# Patient Record
Sex: Female | Born: 1950 | Race: White | Hispanic: No | Smoking: Never smoker
Health system: Southern US, Community
[De-identification: ages and names within clinical notes are randomized; demographics above are authoritative.]

## PROBLEM LIST (undated history)

## (undated) DIAGNOSIS — I10 Essential (primary) hypertension: Secondary | ICD-10-CM

## (undated) DIAGNOSIS — Z9189 Other specified personal risk factors, not elsewhere classified: Secondary | ICD-10-CM

## (undated) DIAGNOSIS — R112 Nausea with vomiting, unspecified: Secondary | ICD-10-CM

## (undated) DIAGNOSIS — R87620 Atypical squamous cells of undetermined significance on cytologic smear of vagina (ASC-US): Secondary | ICD-10-CM

## (undated) DIAGNOSIS — Z9889 Other specified postprocedural states: Secondary | ICD-10-CM

## (undated) DIAGNOSIS — N893 Dysplasia of vagina, unspecified: Secondary | ICD-10-CM

## (undated) DIAGNOSIS — K279 Peptic ulcer, site unspecified, unspecified as acute or chronic, without hemorrhage or perforation: Secondary | ICD-10-CM

## (undated) DIAGNOSIS — T782XXA Anaphylactic shock, unspecified, initial encounter: Secondary | ICD-10-CM

## (undated) DIAGNOSIS — N6099 Unspecified benign mammary dysplasia of unspecified breast: Secondary | ICD-10-CM

## (undated) DIAGNOSIS — M4310 Spondylolisthesis, site unspecified: Secondary | ICD-10-CM

## (undated) DIAGNOSIS — IMO0002 Reserved for concepts with insufficient information to code with codable children: Secondary | ICD-10-CM

## (undated) DIAGNOSIS — N159 Renal tubulo-interstitial disease, unspecified: Secondary | ICD-10-CM

## (undated) HISTORY — DX: Reserved for concepts with insufficient information to code with codable children: IMO0002

## (undated) HISTORY — DX: Spondylolisthesis, site unspecified: M43.10

## (undated) HISTORY — DX: Essential (primary) hypertension: I10

## (undated) HISTORY — DX: Dysplasia of vagina, unspecified: N89.3

## (undated) HISTORY — PX: TONSILLECTOMY: SUR1361

## (undated) HISTORY — DX: Unspecified benign mammary dysplasia of unspecified breast: N60.99

## (undated) HISTORY — PX: RECTAL SURGERY: SHX760

## (undated) HISTORY — PX: ECTOPIC PREGNANCY SURGERY: SHX613

## (undated) HISTORY — DX: Atypical squamous cells of undetermined significance on cytologic smear of vagina (ASC-US): R87.620

## (undated) HISTORY — DX: Other specified personal risk factors, not elsewhere classified: Z91.89

## (undated) HISTORY — DX: Anaphylactic shock, unspecified, initial encounter: T78.2XXA

---

## 1991-03-11 HISTORY — PX: VAGINAL HYSTERECTOMY: SUR661

## 1997-11-28 ENCOUNTER — Other Ambulatory Visit: Admission: RE | Admit: 1997-11-28 | Discharge: 1997-11-28 | Payer: Self-pay | Admitting: Obstetrics and Gynecology

## 1998-11-29 ENCOUNTER — Other Ambulatory Visit: Admission: RE | Admit: 1998-11-29 | Discharge: 1998-11-29 | Payer: Self-pay | Admitting: Obstetrics and Gynecology

## 1999-01-09 ENCOUNTER — Other Ambulatory Visit: Admission: RE | Admit: 1999-01-09 | Discharge: 1999-01-09 | Payer: Self-pay | Admitting: Obstetrics and Gynecology

## 1999-02-08 DIAGNOSIS — N893 Dysplasia of vagina, unspecified: Secondary | ICD-10-CM

## 1999-02-08 HISTORY — DX: Dysplasia of vagina, unspecified: N89.3

## 1999-02-18 ENCOUNTER — Other Ambulatory Visit: Admission: RE | Admit: 1999-02-18 | Discharge: 1999-02-18 | Payer: Self-pay | Admitting: Obstetrics and Gynecology

## 1999-02-18 ENCOUNTER — Encounter (INDEPENDENT_AMBULATORY_CARE_PROVIDER_SITE_OTHER): Payer: Self-pay | Admitting: Specialist

## 1999-05-20 ENCOUNTER — Other Ambulatory Visit: Admission: RE | Admit: 1999-05-20 | Discharge: 1999-05-20 | Payer: Self-pay | Admitting: Obstetrics and Gynecology

## 1999-10-23 ENCOUNTER — Other Ambulatory Visit: Admission: RE | Admit: 1999-10-23 | Discharge: 1999-10-23 | Payer: Self-pay | Admitting: Obstetrics and Gynecology

## 1999-12-06 ENCOUNTER — Encounter: Admission: RE | Admit: 1999-12-06 | Discharge: 1999-12-06 | Payer: Self-pay | Admitting: Obstetrics and Gynecology

## 1999-12-06 ENCOUNTER — Encounter: Payer: Self-pay | Admitting: Obstetrics and Gynecology

## 2000-01-29 ENCOUNTER — Other Ambulatory Visit: Admission: RE | Admit: 2000-01-29 | Discharge: 2000-01-29 | Payer: Self-pay | Admitting: Obstetrics and Gynecology

## 2000-07-22 ENCOUNTER — Other Ambulatory Visit: Admission: RE | Admit: 2000-07-22 | Discharge: 2000-07-22 | Payer: Self-pay | Admitting: Obstetrics and Gynecology

## 2000-12-17 ENCOUNTER — Encounter: Admission: RE | Admit: 2000-12-17 | Discharge: 2000-12-17 | Payer: Self-pay | Admitting: Obstetrics and Gynecology

## 2000-12-17 ENCOUNTER — Encounter: Payer: Self-pay | Admitting: Obstetrics and Gynecology

## 2001-02-01 ENCOUNTER — Other Ambulatory Visit: Admission: RE | Admit: 2001-02-01 | Discharge: 2001-02-01 | Payer: Self-pay | Admitting: Obstetrics and Gynecology

## 2001-03-10 HISTORY — PX: KNEE SURGERY: SHX244

## 2001-07-21 ENCOUNTER — Other Ambulatory Visit: Admission: RE | Admit: 2001-07-21 | Discharge: 2001-07-21 | Payer: Self-pay | Admitting: Obstetrics and Gynecology

## 2001-12-31 ENCOUNTER — Encounter: Admission: RE | Admit: 2001-12-31 | Discharge: 2001-12-31 | Payer: Self-pay | Admitting: Obstetrics and Gynecology

## 2001-12-31 ENCOUNTER — Encounter: Payer: Self-pay | Admitting: Obstetrics and Gynecology

## 2002-02-02 ENCOUNTER — Other Ambulatory Visit: Admission: RE | Admit: 2002-02-02 | Discharge: 2002-02-02 | Payer: Self-pay | Admitting: Obstetrics and Gynecology

## 2002-07-27 ENCOUNTER — Other Ambulatory Visit: Admission: RE | Admit: 2002-07-27 | Discharge: 2002-07-27 | Payer: Self-pay | Admitting: Obstetrics and Gynecology

## 2003-01-06 ENCOUNTER — Encounter: Admission: RE | Admit: 2003-01-06 | Discharge: 2003-01-06 | Payer: Self-pay | Admitting: Obstetrics and Gynecology

## 2003-01-11 ENCOUNTER — Encounter: Admission: RE | Admit: 2003-01-11 | Discharge: 2003-01-11 | Payer: Self-pay | Admitting: Obstetrics and Gynecology

## 2003-02-22 ENCOUNTER — Other Ambulatory Visit: Admission: RE | Admit: 2003-02-22 | Discharge: 2003-02-22 | Payer: Self-pay | Admitting: Obstetrics and Gynecology

## 2003-07-28 ENCOUNTER — Other Ambulatory Visit: Admission: RE | Admit: 2003-07-28 | Discharge: 2003-07-28 | Payer: Self-pay | Admitting: Obstetrics and Gynecology

## 2004-02-14 ENCOUNTER — Encounter: Admission: RE | Admit: 2004-02-14 | Discharge: 2004-02-14 | Payer: Self-pay | Admitting: Obstetrics and Gynecology

## 2004-02-23 ENCOUNTER — Encounter: Admission: RE | Admit: 2004-02-23 | Discharge: 2004-02-23 | Payer: Self-pay | Admitting: Obstetrics and Gynecology

## 2004-02-28 ENCOUNTER — Other Ambulatory Visit: Admission: RE | Admit: 2004-02-28 | Discharge: 2004-02-28 | Payer: Self-pay | Admitting: Obstetrics and Gynecology

## 2004-09-04 ENCOUNTER — Other Ambulatory Visit: Admission: RE | Admit: 2004-09-04 | Discharge: 2004-09-04 | Payer: Self-pay | Admitting: Obstetrics and Gynecology

## 2005-02-24 ENCOUNTER — Encounter: Admission: RE | Admit: 2005-02-24 | Discharge: 2005-02-24 | Payer: Self-pay | Admitting: Obstetrics and Gynecology

## 2005-03-04 ENCOUNTER — Other Ambulatory Visit: Admission: RE | Admit: 2005-03-04 | Discharge: 2005-03-04 | Payer: Self-pay | Admitting: Addiction Medicine

## 2005-03-18 ENCOUNTER — Encounter: Admission: RE | Admit: 2005-03-18 | Discharge: 2005-03-18 | Payer: Self-pay | Admitting: Obstetrics and Gynecology

## 2005-09-03 ENCOUNTER — Other Ambulatory Visit: Admission: RE | Admit: 2005-09-03 | Discharge: 2005-09-03 | Payer: Self-pay | Admitting: Obstetrics and Gynecology

## 2006-02-25 ENCOUNTER — Encounter: Admission: RE | Admit: 2006-02-25 | Discharge: 2006-02-25 | Payer: Self-pay | Admitting: Obstetrics and Gynecology

## 2006-03-09 ENCOUNTER — Encounter: Admission: RE | Admit: 2006-03-09 | Discharge: 2006-03-09 | Payer: Self-pay | Admitting: Obstetrics and Gynecology

## 2006-03-11 ENCOUNTER — Other Ambulatory Visit: Admission: RE | Admit: 2006-03-11 | Discharge: 2006-03-11 | Payer: Self-pay | Admitting: Obstetrics and Gynecology

## 2006-09-23 ENCOUNTER — Other Ambulatory Visit: Admission: RE | Admit: 2006-09-23 | Discharge: 2006-09-23 | Payer: Self-pay | Admitting: Obstetrics and Gynecology

## 2007-03-15 ENCOUNTER — Encounter: Admission: RE | Admit: 2007-03-15 | Discharge: 2007-03-15 | Payer: Self-pay | Admitting: Obstetrics and Gynecology

## 2007-03-15 ENCOUNTER — Other Ambulatory Visit: Admission: RE | Admit: 2007-03-15 | Discharge: 2007-03-15 | Payer: Self-pay | Admitting: Obstetrics and Gynecology

## 2007-09-16 ENCOUNTER — Other Ambulatory Visit: Admission: RE | Admit: 2007-09-16 | Discharge: 2007-09-16 | Payer: Self-pay | Admitting: Obstetrics and Gynecology

## 2008-04-03 ENCOUNTER — Encounter: Admission: RE | Admit: 2008-04-03 | Discharge: 2008-04-03 | Payer: Self-pay | Admitting: Obstetrics and Gynecology

## 2008-04-06 ENCOUNTER — Ambulatory Visit: Payer: Self-pay | Admitting: Obstetrics and Gynecology

## 2008-04-06 ENCOUNTER — Encounter: Payer: Self-pay | Admitting: Obstetrics and Gynecology

## 2008-04-06 ENCOUNTER — Other Ambulatory Visit: Admission: RE | Admit: 2008-04-06 | Discharge: 2008-04-06 | Payer: Self-pay | Admitting: Obstetrics and Gynecology

## 2008-09-20 ENCOUNTER — Ambulatory Visit: Payer: Self-pay | Admitting: Obstetrics and Gynecology

## 2008-09-20 ENCOUNTER — Other Ambulatory Visit: Admission: RE | Admit: 2008-09-20 | Discharge: 2008-09-20 | Payer: Self-pay | Admitting: Obstetrics and Gynecology

## 2008-09-20 ENCOUNTER — Encounter: Payer: Self-pay | Admitting: Obstetrics and Gynecology

## 2008-09-22 ENCOUNTER — Ambulatory Visit: Payer: Self-pay | Admitting: Obstetrics and Gynecology

## 2009-03-10 HISTORY — PX: BREAST EXCISIONAL BIOPSY: SUR124

## 2009-04-05 ENCOUNTER — Encounter: Admission: RE | Admit: 2009-04-05 | Discharge: 2009-04-05 | Payer: Self-pay | Admitting: Obstetrics and Gynecology

## 2009-04-09 ENCOUNTER — Other Ambulatory Visit: Admission: RE | Admit: 2009-04-09 | Discharge: 2009-04-09 | Payer: Self-pay | Admitting: Obstetrics and Gynecology

## 2009-04-09 ENCOUNTER — Ambulatory Visit: Payer: Self-pay | Admitting: Obstetrics and Gynecology

## 2009-04-12 ENCOUNTER — Ambulatory Visit: Payer: Self-pay | Admitting: Obstetrics and Gynecology

## 2009-04-18 ENCOUNTER — Ambulatory Visit: Payer: Self-pay | Admitting: Obstetrics and Gynecology

## 2009-10-03 ENCOUNTER — Other Ambulatory Visit: Admission: RE | Admit: 2009-10-03 | Discharge: 2009-10-03 | Payer: Self-pay | Admitting: Obstetrics and Gynecology

## 2009-10-03 ENCOUNTER — Ambulatory Visit: Payer: Self-pay | Admitting: Obstetrics and Gynecology

## 2009-12-27 ENCOUNTER — Ambulatory Visit: Payer: Self-pay | Admitting: Obstetrics and Gynecology

## 2010-01-03 ENCOUNTER — Encounter: Admission: RE | Admit: 2010-01-03 | Discharge: 2010-01-03 | Payer: Self-pay | Admitting: Obstetrics and Gynecology

## 2010-02-25 ENCOUNTER — Ambulatory Visit (HOSPITAL_COMMUNITY)
Admission: RE | Admit: 2010-02-25 | Discharge: 2010-02-25 | Payer: Self-pay | Source: Home / Self Care | Attending: General Surgery | Admitting: General Surgery

## 2010-02-25 HISTORY — PX: BREAST SURGERY: SHX581

## 2010-03-31 ENCOUNTER — Encounter: Payer: Self-pay | Admitting: Obstetrics and Gynecology

## 2010-04-09 ENCOUNTER — Encounter
Admission: RE | Admit: 2010-04-09 | Discharge: 2010-04-09 | Payer: Self-pay | Source: Home / Self Care | Attending: General Surgery | Admitting: General Surgery

## 2010-04-10 ENCOUNTER — Other Ambulatory Visit: Payer: Self-pay | Admitting: General Surgery

## 2010-04-10 DIAGNOSIS — R928 Other abnormal and inconclusive findings on diagnostic imaging of breast: Secondary | ICD-10-CM

## 2010-04-12 ENCOUNTER — Other Ambulatory Visit: Payer: Self-pay | Admitting: General Surgery

## 2010-04-12 ENCOUNTER — Ambulatory Visit
Admission: RE | Admit: 2010-04-12 | Discharge: 2010-04-12 | Disposition: A | Discharge status: Home / Self Care | Payer: BC Managed Care – PPO | Source: Ambulatory Visit | Attending: General Surgery | Admitting: General Surgery

## 2010-04-12 DIAGNOSIS — R928 Other abnormal and inconclusive findings on diagnostic imaging of breast: Secondary | ICD-10-CM

## 2010-04-16 ENCOUNTER — Other Ambulatory Visit: Payer: Self-pay

## 2010-04-26 ENCOUNTER — Encounter (INDEPENDENT_AMBULATORY_CARE_PROVIDER_SITE_OTHER): Payer: BC Managed Care – PPO | Admitting: Obstetrics and Gynecology

## 2010-04-26 ENCOUNTER — Other Ambulatory Visit (HOSPITAL_COMMUNITY)
Admission: RE | Admit: 2010-04-26 | Discharge: 2010-04-26 | Disposition: A | Discharge status: Home / Self Care | Payer: BC Managed Care – PPO | Source: Ambulatory Visit | Attending: Obstetrics and Gynecology | Admitting: Obstetrics and Gynecology

## 2010-04-26 ENCOUNTER — Other Ambulatory Visit: Payer: Self-pay | Admitting: Obstetrics and Gynecology

## 2010-04-26 DIAGNOSIS — Z01419 Encounter for gynecological examination (general) (routine) without abnormal findings: Secondary | ICD-10-CM

## 2010-04-26 DIAGNOSIS — R823 Hemoglobinuria: Secondary | ICD-10-CM

## 2010-04-26 DIAGNOSIS — Z124 Encounter for screening for malignant neoplasm of cervix: Secondary | ICD-10-CM | POA: Insufficient documentation

## 2010-05-02 ENCOUNTER — Other Ambulatory Visit (INDEPENDENT_AMBULATORY_CARE_PROVIDER_SITE_OTHER): Payer: BC Managed Care – PPO | Admitting: Obstetrics and Gynecology

## 2010-05-02 DIAGNOSIS — Z13220 Encounter for screening for lipoid disorders: Secondary | ICD-10-CM

## 2010-05-02 DIAGNOSIS — R635 Abnormal weight gain: Secondary | ICD-10-CM

## 2010-05-06 DIAGNOSIS — Z1211 Encounter for screening for malignant neoplasm of colon: Secondary | ICD-10-CM

## 2010-05-21 LAB — DIFFERENTIAL
Basophils Absolute: 0 10*3/uL (ref 0.0–0.1)
Basophils Relative: 1 % (ref 0–1)
Eosinophils Absolute: 0.2 10*3/uL (ref 0.0–0.7)
Eosinophils Relative: 3 % (ref 0–5)
Lymphocytes Relative: 33 % (ref 12–46)
Lymphs Abs: 2.1 10*3/uL (ref 0.7–4.0)
Monocytes Absolute: 0.5 10*3/uL (ref 0.1–1.0)
Monocytes Relative: 8 % (ref 3–12)
Neutro Abs: 3.5 10*3/uL (ref 1.7–7.7)
Neutrophils Relative %: 56 % (ref 43–77)

## 2010-05-21 LAB — BASIC METABOLIC PANEL
BUN: 19 mg/dL (ref 6–23)
CO2: 30 mEq/L (ref 19–32)
Calcium: 9.7 mg/dL (ref 8.4–10.5)
Chloride: 102 mEq/L (ref 96–112)
Creatinine, Ser: 1.03 mg/dL (ref 0.4–1.2)
GFR calc Af Amer: >60 mL/min (ref >60)
GFR calc non Af Amer: 55 mL/min — ABNORMAL LOW (ref >60)
Glucose, Bld: 88 mg/dL (ref 70–99)
Potassium: 3.6 mEq/L (ref 3.5–5.1)
Sodium: 138 mEq/L (ref 135–145)

## 2010-05-21 LAB — CBC
HCT: 41.5 % (ref 36.0–46.0)
Hemoglobin: 14 g/dL (ref 12.0–15.0)
MCH: 30.4 pg (ref 26.0–34.0)
MCHC: 33.7 g/dL (ref 30.0–36.0)
MCV: 90.2 fL (ref 78.0–100.0)
Platelets: 329 10*3/uL (ref 150–400)
RBC: 4.6 MIL/uL (ref 3.87–5.11)
RDW: 12.8 % (ref 11.5–15.5)
WBC: 6.4 10*3/uL (ref 4.0–10.5)

## 2010-05-21 LAB — SURGICAL PCR SCREEN
MRSA, PCR: NEGATIVE
Staphylococcus aureus: NEGATIVE

## 2011-03-14 ENCOUNTER — Other Ambulatory Visit: Payer: Self-pay | Admitting: Obstetrics and Gynecology

## 2011-03-14 DIAGNOSIS — Z1231 Encounter for screening mammogram for malignant neoplasm of breast: Secondary | ICD-10-CM

## 2011-04-14 ENCOUNTER — Ambulatory Visit
Admission: RE | Admit: 2011-04-14 | Discharge: 2011-04-14 | Disposition: A | Discharge status: Home / Self Care | Payer: BC Managed Care – PPO | Source: Ambulatory Visit | Attending: Obstetrics and Gynecology | Admitting: Obstetrics and Gynecology

## 2011-04-14 DIAGNOSIS — Z1231 Encounter for screening mammogram for malignant neoplasm of breast: Secondary | ICD-10-CM

## 2011-04-18 ENCOUNTER — Encounter: Payer: Self-pay | Admitting: *Deleted

## 2011-04-18 ENCOUNTER — Other Ambulatory Visit: Payer: Self-pay | Admitting: *Deleted

## 2011-04-18 DIAGNOSIS — N63 Unspecified lump in unspecified breast: Secondary | ICD-10-CM

## 2011-04-23 ENCOUNTER — Other Ambulatory Visit: Payer: Self-pay | Admitting: *Deleted

## 2011-04-23 DIAGNOSIS — N63 Unspecified lump in unspecified breast: Secondary | ICD-10-CM

## 2011-04-28 ENCOUNTER — Other Ambulatory Visit: Payer: Self-pay | Admitting: Obstetrics and Gynecology

## 2011-04-28 NOTE — Telephone Encounter (Signed)
HAS CE SCHEDULED TOMORROW.

## 2011-04-29 ENCOUNTER — Other Ambulatory Visit (HOSPITAL_COMMUNITY)
Admission: RE | Admit: 2011-04-29 | Discharge: 2011-04-29 | Disposition: A | Discharge status: Home / Self Care | Payer: BC Managed Care – PPO | Source: Ambulatory Visit | Attending: Obstetrics and Gynecology | Admitting: Obstetrics and Gynecology

## 2011-04-29 ENCOUNTER — Ambulatory Visit (INDEPENDENT_AMBULATORY_CARE_PROVIDER_SITE_OTHER): Payer: BC Managed Care – PPO | Admitting: Obstetrics and Gynecology

## 2011-04-29 ENCOUNTER — Encounter: Payer: Self-pay | Admitting: Obstetrics and Gynecology

## 2011-04-29 ENCOUNTER — Telehealth: Payer: Self-pay | Admitting: *Deleted

## 2011-04-29 VITALS — BP 124/78 | Ht 70.0 in | Wt 186.0 lb

## 2011-04-29 DIAGNOSIS — Z01419 Encounter for gynecological examination (general) (routine) without abnormal findings: Secondary | ICD-10-CM | POA: Insufficient documentation

## 2011-04-29 DIAGNOSIS — N898 Other specified noninflammatory disorders of vagina: Secondary | ICD-10-CM

## 2011-04-29 DIAGNOSIS — IMO0002 Reserved for concepts with insufficient information to code with codable children: Secondary | ICD-10-CM | POA: Insufficient documentation

## 2011-04-29 DIAGNOSIS — I10 Essential (primary) hypertension: Secondary | ICD-10-CM | POA: Insufficient documentation

## 2011-04-29 DIAGNOSIS — R3129 Other microscopic hematuria: Secondary | ICD-10-CM

## 2011-04-29 DIAGNOSIS — T782XXA Anaphylactic shock, unspecified, initial encounter: Secondary | ICD-10-CM | POA: Insufficient documentation

## 2011-04-29 DIAGNOSIS — Z1159 Encounter for screening for other viral diseases: Secondary | ICD-10-CM | POA: Insufficient documentation

## 2011-04-29 DIAGNOSIS — N893 Dysplasia of vagina, unspecified: Secondary | ICD-10-CM

## 2011-04-29 LAB — URINALYSIS W MICROSCOPIC + REFLEX CULTURE
Casts: NONE SEEN
Glucose, UA: NEGATIVE mg/dL
Ketones, ur: NEGATIVE mg/dL
Leukocytes, UA: NEGATIVE
Protein, ur: NEGATIVE mg/dL
WBC, UA: NONE SEEN WBC/hpf (ref <3)

## 2011-04-29 MED ORDER — ESTRADIOL 1 MG PO TABS: 1.0000 mg | ORAL_TABLET | Freq: Every day | ORAL | Status: DC

## 2011-04-29 NOTE — Telephone Encounter (Signed)
Called pharmacy to get changed.

## 2011-04-29 NOTE — Progress Notes (Signed)
The patient came to see me today for her annual GYN exam. She continues to do well on her estradiol. She has been recalled for possible mass in her right breast this Friday. She is having no vaginal bleeding. She is having no pelvic pain. She is now had 3 normal Pap smears. She has had normal bone densities. She does her lab through her PCP. She had an anaphylactic reaction with etiology unknown this year. She does her lab through PCP. Last year she had a workup from the urologist for microscopic hematuria which was normal.  HEENT: Within normal limits. Kennon Portela present Neck: No masses. Supraclavicular lymph nodes: Not enlarged. Breasts: Examined in both sitting and lying position. Symmetrical without skin changes or masses. Abdomen: Soft no masses guarding or rebound. No hernias. Pelvic: External within normal limits. BUS within normal limits. Vaginal examination shows good estrogen effect, no cystocele enterocele or rectocele. Cervix and uterus absent. Adnexa within normal limits. Rectovaginal confirmatory. Extremities within normal limits.  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 Microscopic hematuria. Menopausal symptoms.  Plan: Continue estradiol 1 mg daily. Followup mammogram this week.

## 2011-04-29 NOTE — Telephone Encounter (Signed)
[facility] faxed to say that the Estradiol 1mg  is not available, can we change to 0.5 BID?

## 2011-04-29 NOTE — Telephone Encounter (Signed)
Yes. She can either do it twice a day or take both pills together.

## 2011-04-30 LAB — URINE CULTURE
Colony Count: NO GROWTH
Organism ID, Bacteria: NO GROWTH

## 2011-05-02 ENCOUNTER — Ambulatory Visit
Admission: RE | Admit: 2011-05-02 | Discharge: 2011-05-02 | Disposition: A | Discharge status: Home / Self Care | Payer: BC Managed Care – PPO | Source: Ambulatory Visit | Attending: Obstetrics and Gynecology | Admitting: Obstetrics and Gynecology

## 2011-05-02 DIAGNOSIS — N63 Unspecified lump in unspecified breast: Secondary | ICD-10-CM

## 2011-10-16 ENCOUNTER — Other Ambulatory Visit (HOSPITAL_COMMUNITY)
Admission: RE | Admit: 2011-10-16 | Discharge: 2011-10-16 | Disposition: A | Discharge status: Home / Self Care | Payer: BC Managed Care – PPO | Source: Ambulatory Visit | Attending: Obstetrics and Gynecology | Admitting: Obstetrics and Gynecology

## 2011-10-16 ENCOUNTER — Ambulatory Visit (INDEPENDENT_AMBULATORY_CARE_PROVIDER_SITE_OTHER): Payer: BC Managed Care – PPO | Admitting: Obstetrics and Gynecology

## 2011-10-16 DIAGNOSIS — Z1151 Encounter for screening for human papillomavirus (HPV): Secondary | ICD-10-CM | POA: Insufficient documentation

## 2011-10-16 DIAGNOSIS — N893 Dysplasia of vagina, unspecified: Secondary | ICD-10-CM

## 2011-10-16 DIAGNOSIS — Z01419 Encounter for gynecological examination (general) (routine) without abnormal findings: Secondary | ICD-10-CM | POA: Insufficient documentation

## 2011-10-16 DIAGNOSIS — N898 Other specified noninflammatory disorders of vagina: Secondary | ICD-10-CM

## 2011-10-16 NOTE — Progress Notes (Signed)
Patient came to see me today for follow Pap smear. 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 She continued to be observed. Recently she has 3 normal Pap smears in a row. Then in February of this year she had a Pap smear showing ascus with no high-risk HPV detected. This was prior to the new guidelines we asked her return in 6 months which is now.  Exam: Kennon Portela present. External: Within normal limits. BUS: Within normal limits. Vaginal exam: Within normal limits. Bimanual exam fails to reveal Claybon Jabs is status post vaginal hysterectomy.  Assessment: Vaginal dysplasia  Plan: Cytology and  co-testing done. Discussed new guidelines. I believe if all okay she can return to routine screening.

## 2011-10-16 NOTE — Patient Instructions (Signed)
I will call you with Pap smear results.

## 2012-04-08 ENCOUNTER — Other Ambulatory Visit: Payer: Self-pay | Admitting: Gynecology

## 2012-04-08 DIAGNOSIS — Z1231 Encounter for screening mammogram for malignant neoplasm of breast: Secondary | ICD-10-CM

## 2012-05-04 ENCOUNTER — Other Ambulatory Visit: Payer: Self-pay | Admitting: Obstetrics and Gynecology

## 2012-05-06 ENCOUNTER — Ambulatory Visit
Admission: RE | Admit: 2012-05-06 | Discharge: 2012-05-06 | Disposition: A | Discharge status: Home / Self Care | Payer: BC Managed Care – PPO | Source: Ambulatory Visit | Attending: Gynecology | Admitting: Gynecology

## 2012-05-12 ENCOUNTER — Other Ambulatory Visit: Payer: Self-pay | Admitting: Advanced Practice Midwife

## 2012-05-14 ENCOUNTER — Other Ambulatory Visit (HOSPITAL_COMMUNITY)
Admission: RE | Admit: 2012-05-14 | Discharge: 2012-05-14 | Disposition: A | Discharge status: Home / Self Care | Payer: BC Managed Care – PPO | Source: Ambulatory Visit | Attending: Gynecology | Admitting: Gynecology

## 2012-05-14 ENCOUNTER — Encounter: Payer: Self-pay | Admitting: Gynecology

## 2012-05-14 ENCOUNTER — Ambulatory Visit (INDEPENDENT_AMBULATORY_CARE_PROVIDER_SITE_OTHER): Payer: BC Managed Care – PPO | Admitting: Gynecology

## 2012-05-14 VITALS — BP 130/80 | Ht 70.0 in | Wt 168.0 lb

## 2012-05-14 DIAGNOSIS — Z01419 Encounter for gynecological examination (general) (routine) without abnormal findings: Secondary | ICD-10-CM | POA: Insufficient documentation

## 2012-05-14 MED ORDER — ESTRADIOL 1 MG PO TABS: 1.0000 mg | ORAL_TABLET | Freq: Every day | ORAL | Status: DC

## 2012-05-14 MED ORDER — ZOLPIDEM TARTRATE 5 MG PO TABS: 5.0000 mg | ORAL_TABLET | Freq: Every evening | ORAL | Status: DC | PRN

## 2012-05-14 MED ORDER — EPINEPHRINE 0.3 MG/0.3ML IJ DEVI
0.3000 mg | INTRAMUSCULAR | Status: DC | PRN
Start: 2012-05-14 — End: 2013-12-11

## 2012-05-14 NOTE — Progress Notes (Signed)
Pamela Whitehead 07/13/1950 [id number]        62 y.o.  G1P0010 for annual exam.  Several issues noted below.  Past medical history,surgical history, medications, allergies, family history and social history were all reviewed and documented in the EPIC chart. ROS:  Was performed and pertinent positives and negatives are included in the history.  Exam: Kim assistant Filed Vitals:   05/14/12 0926  BP: 130/80  Height: 5\' 10"  (1.778 m)  Weight: 168 lb (76.204 kg)   General appearance  Normal Skin grossly normal Head/Neck normal with no cervical or supraclavicular adenopathy thyroid normal Lungs  clear Cardiac RR, without RMG Abdominal  soft, nontender, without masses, organomegaly or hernia Breasts  examined lying and sitting. Left without masses, retractions, discharge or axillary adenopathy.  Right with well circumscribed mass 1.5 cm lower outer quadrant 2-3 finger breaths from areola. Freely mobile no overlying skin changes. No nipple discharge other masses or axillary adenopathy Pelvic  Ext/BUS/vagina  normal with mild atrophic changes  Adnexa  Without masses or tenderness    Anus and perineum  normal   Rectovaginal  normal sphincter tone without palpated masses or tenderness.    Assessment/Plan:  62 y.o. G76P0010 female for annual exam.   1. Right breast mass. Known to be a cyst by mammography/ultrasound last year. Is due for her studies now and we'll schedule diagnostic mammogram with ultrasound followup. Offered to aspirate it today and the patient declined. Assuming stable and consistent with benign etiology then we'll continue to follow at her choice. 2. ERT. Patient is on estradiol 1 mg doing well. I reviewed the WHI study and risks of ERT to include stroke heart attack DVT possible breast cancer Association.  ACOG and NAMS statements for lowest dose for shortness. Upon review. Patient's great happy being on HRT and wants to continue it I refilled her x1 year. 3. History of ascus  negative high risk HPV. Status post hysterectomy for endometriosis. LGSIL vaginal cuff 2001 per Dr. Verl Dicker note. Followup Pap smears normal at the last year was 2 ascus Pap smears negative HPV. Pap of cuff done today.  If normal continue observation if otherwise will triaged based on results. 4. Colonoscopy. Patient's never had. I emphasized the need to schedule as has Dr. Abigail Miyamoto. Patient understands the recommendations and agrees to schedule. 5. DEXA normal 5 years ago. Recommend repeat now and she will schedule. Increase calcium vitamin D reviewed. 6. Occasional insomnia. Uses Ambien 5 mg. #30 with one refill provided. 7. Health maintenance. No blood work done as it is all done through Dr. Nils Flack office. Refilled her EpiPen at her request. Followup one year, sooner as needed.    Dara Lords MD, 10:08 AM 05/14/2012

## 2012-05-14 NOTE — Patient Instructions (Signed)
Followup for bone density as scheduled. Followup in one year for annual exam 

## 2012-05-14 NOTE — Addendum Note (Signed)
Addended by: Dayna Barker on: 05/14/2012 10:16 AM   Modules accepted: Orders

## 2012-05-17 ENCOUNTER — Telehealth: Payer: Self-pay | Admitting: *Deleted

## 2012-05-17 LAB — URINALYSIS W MICROSCOPIC + REFLEX CULTURE
Casts: NONE SEEN
Crystals: NONE SEEN
Leukocytes, UA: NEGATIVE
Nitrite: NEGATIVE
Specific Gravity, Urine: 1.012 (ref 1.005–1.030)
Squamous Epithelial / LPF: NONE SEEN
pH: 6 (ref 5.0–8.0)

## 2012-05-17 NOTE — Telephone Encounter (Signed)
Message copied by Aura Camps on Mon May 17, 2012  9:03 AM ------      Message from: Dara Lords      Created: Fri May 14, 2012 10:15 AM       Help patient arrange diagnostic mammography and ultrasound at breast center reference annual followup with history of right breast cyst palpable on exam ------

## 2012-05-17 NOTE — Telephone Encounter (Signed)
Orders placed for the below note.

## 2012-05-21 NOTE — Telephone Encounter (Signed)
appt 05/27/12 2 8:00 am

## 2012-05-27 ENCOUNTER — Ambulatory Visit
Admission: RE | Admit: 2012-05-27 | Discharge: 2012-05-27 | Disposition: A | Discharge status: Home / Self Care | Payer: BC Managed Care – PPO | Source: Ambulatory Visit | Attending: Gynecology | Admitting: Gynecology

## 2012-08-12 ENCOUNTER — Ambulatory Visit (INDEPENDENT_AMBULATORY_CARE_PROVIDER_SITE_OTHER): Payer: BC Managed Care – PPO

## 2012-08-12 DIAGNOSIS — Z13820 Encounter for screening for osteoporosis: Secondary | ICD-10-CM

## 2012-08-12 DIAGNOSIS — Z01419 Encounter for gynecological examination (general) (routine) without abnormal findings: Secondary | ICD-10-CM

## 2012-08-12 DIAGNOSIS — Z780 Asymptomatic menopausal state: Secondary | ICD-10-CM

## 2012-08-16 ENCOUNTER — Encounter (HOSPITAL_COMMUNITY): Payer: Self-pay | Admitting: Family Medicine

## 2012-08-16 ENCOUNTER — Emergency Department (HOSPITAL_COMMUNITY)
Admission: EM | Admit: 2012-08-16 | Discharge: 2012-08-16 | Disposition: A | Discharge status: Home / Self Care | Payer: BC Managed Care – PPO | Attending: Emergency Medicine | Admitting: Emergency Medicine

## 2012-08-16 ENCOUNTER — Emergency Department (HOSPITAL_COMMUNITY): Payer: BC Managed Care – PPO

## 2012-08-16 DIAGNOSIS — R55 Syncope and collapse: Secondary | ICD-10-CM

## 2012-08-16 DIAGNOSIS — Z79899 Other long term (current) drug therapy: Secondary | ICD-10-CM | POA: Insufficient documentation

## 2012-08-16 DIAGNOSIS — Z880 Allergy status to penicillin: Secondary | ICD-10-CM | POA: Insufficient documentation

## 2012-08-16 DIAGNOSIS — Z87411 Personal history of vaginal dysplasia: Secondary | ICD-10-CM | POA: Insufficient documentation

## 2012-08-16 DIAGNOSIS — Z872 Personal history of diseases of the skin and subcutaneous tissue: Secondary | ICD-10-CM | POA: Insufficient documentation

## 2012-08-16 DIAGNOSIS — Z8742 Personal history of other diseases of the female genital tract: Secondary | ICD-10-CM | POA: Insufficient documentation

## 2012-08-16 DIAGNOSIS — N390 Urinary tract infection, site not specified: Secondary | ICD-10-CM

## 2012-08-16 DIAGNOSIS — I10 Essential (primary) hypertension: Secondary | ICD-10-CM | POA: Insufficient documentation

## 2012-08-16 DIAGNOSIS — Z8739 Personal history of other diseases of the musculoskeletal system and connective tissue: Secondary | ICD-10-CM | POA: Insufficient documentation

## 2012-08-16 DIAGNOSIS — R42 Dizziness and giddiness: Secondary | ICD-10-CM | POA: Insufficient documentation

## 2012-08-16 LAB — CBC WITH DIFFERENTIAL/PLATELET
Basophils Absolute: 0 10*3/uL (ref 0.0–0.1)
Basophils Relative: 0 % (ref 0–1)
Eosinophils Relative: 0 % (ref 0–5)
HCT: 42.6 % (ref 36.0–46.0)
Hemoglobin: 14.4 g/dL (ref 12.0–15.0)
Lymphocytes Relative: 9 % — ABNORMAL LOW (ref 12–46)
MCHC: 33.8 g/dL (ref 30.0–36.0)
MCV: 89.7 fL (ref 78.0–100.0)
Monocytes Absolute: 0.7 10*3/uL (ref 0.1–1.0)
Monocytes Relative: 5 % (ref 3–12)
RDW: 12.7 % (ref 11.5–15.5)

## 2012-08-16 LAB — URINE MICROSCOPIC-ADD ON

## 2012-08-16 LAB — COMPREHENSIVE METABOLIC PANEL
AST: 35 U/L (ref 0–37)
BUN: 24 mg/dL — ABNORMAL HIGH (ref 6–23)
CO2: 27 mEq/L (ref 19–32)
Calcium: 9.6 mg/dL (ref 8.4–10.5)
Creatinine, Ser: 1.08 mg/dL (ref 0.50–1.10)
GFR calc non Af Amer: 54 mL/min — ABNORMAL LOW (ref >90)
Total Bilirubin: 0.2 mg/dL — ABNORMAL LOW (ref 0.3–1.2)

## 2012-08-16 LAB — URINALYSIS, ROUTINE W REFLEX MICROSCOPIC
Glucose, UA: NEGATIVE mg/dL
Ketones, ur: NEGATIVE mg/dL
Leukocytes, UA: NEGATIVE
Nitrite: NEGATIVE
Protein, ur: NEGATIVE mg/dL
Urobilinogen, UA: 0.2 mg/dL (ref 0.0–1.0)

## 2012-08-16 LAB — TROPONIN I: Troponin I: <0.3 ng/mL (ref <0.30)

## 2012-08-16 MED ORDER — CEPHALEXIN 250 MG PO CAPS
500.0000 mg | ORAL_CAPSULE | Freq: Two times a day (BID) | ORAL | Status: DC
  Administered 2012-08-16: 500 mg via ORAL
  Filled 2012-08-16: qty 2

## 2012-08-16 MED ORDER — SODIUM CHLORIDE 0.9 % IV BOLUS (SEPSIS)
500.0000 mL | Freq: Once | INTRAVENOUS | Status: AC
  Administered 2012-08-16: 500 mL via INTRAVENOUS

## 2012-08-16 MED ORDER — CEPHALEXIN 500 MG PO CAPS: 500.0000 mg | ORAL_CAPSULE | Freq: Two times a day (BID) | ORAL | Status: DC

## 2012-08-16 MED ORDER — ONDANSETRON 8 MG PO TBDP: 8.0000 mg | ORAL_TABLET | Freq: Three times a day (TID) | ORAL | Status: DC | PRN

## 2012-08-16 NOTE — ED Provider Notes (Signed)
History     CSN: [id number]  Arrival date & time 08/16/12  4098   First MD Initiated Contact with Patient 08/16/12 0827      Chief Complaint  Patient presents with  . Near Syncope    (Consider location/radiation/quality/duration/timing/severity/associated sxs/prior treatment) HPI Comments: [name] who became light headed today while drying her hair.  She took her usual am bp medicine and had taken a vicodin about 0200.  She became very diaphoretic, then light headed,  had a bowel movement, and had to lay on the floor because her vision was going dark and she felt she was losing consciousness.  She denies chest pain, dyspnea, headache, fever, or prior similar events.  She normally take her bp med first thing in a.m but does not normally take vicodin.    The history is provided by the patient.    Past Medical History  Diagnosis Date  . Endometriosis   . 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 Spondylisthesis   . 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 Hypertension   . Ulcer   . Anaphylactic reaction     Unknown cause    Past Surgical History  Procedure Laterality Date  . Knee surgery  2003  . Breast surgery  02/25/2010    left breast biopsy ..for left breast mass. . . for PASH  . Vaginal hysterectomy  1993    vaginal hysterectomy  . Ectopic pregnancy surgery    . Rectal surgery      Family History  Problem Relation Age of Onset  . Hypertension Mother   . Heart disease Father   . Breast cancer Maternal Aunt     Age 4's  . Heart disease Brother     History  Substance Use Topics  . Smoking status: Never Smoker   . Smokeless tobacco: Not on file  . Alcohol Use: Yes     Comment: rare    OB History   Grav Para Term Preterm Abortions TAB SAB Ect Mult Living   1 0   1  0 1  0      Review of Systems  All other systems reviewed and are negative.    Allergies  Celebrex and  Penicillins  Home Medications   Current Outpatient Rx  Name  Route  Sig  Dispense  Refill  . butalbital-acetaminophen-caffeine (FIORICET, ESGIC) 50-325-40 MG per tablet   Oral   Take 1 tablet by mouth 2 (two) times daily as needed for headache.          . cetirizine (ZYRTEC) 10 MG tablet   Oral   Take 10 mg by mouth daily.         Marland Kitchen estradiol (ESTRACE) 1 MG tablet   Oral   Take 1 tablet (1 mg total) by mouth daily.   30 tablet   11   . hydrochlorothiazide (MICROZIDE) 12.5 MG capsule   Oral   Take 12.5 mg by mouth daily.         . RABEprazole (ACIPHEX) 20 MG tablet   Oral   Take 20 mg by mouth daily.         . traMADol-acetaminophen (ULTRACET) 37.5-325 MG per tablet   Oral   Take 1 tablet by mouth every 6 (six) hours as needed for pain.         . valsartan (DIOVAN) 40 MG tablet   Oral   Take 40 mg by mouth  daily.         . zolpidem (AMBIEN) 5 MG tablet   Oral   Take 1 tablet (5 mg total) by mouth at bedtime as needed.   30 tablet   1   . EPINEPHrine (EPI-PEN) 0.3 mg/0.3 mL DEVI   Intramuscular   Inject 0.3 mLs (0.3 mg total) into the muscle as needed.   1 Device   1     BP 139/56  Pulse 78  Temp(Src) 97.9 F (36.6 C) (Oral)  Resp 14  SpO2 100%  Physical Exam  Nursing note and vitals reviewed. Constitutional: She appears well-developed and well-nourished.  HENT:  Head: Normocephalic and atraumatic.  Right Ear: External ear normal.  Left Ear: External ear normal.  Nose: Nose normal.  Mouth/Throat: Oropharynx is clear and moist.  Eyes: Conjunctivae and EOM are normal. Pupils are equal, round, and reactive to light.  Neck: Normal range of motion. Neck supple.  Cardiovascular: Normal rate, regular rhythm, normal heart sounds and intact distal pulses.   Pulmonary/Chest: Effort normal and breath sounds normal.  Abdominal: Soft. Bowel sounds are normal. There is no tenderness.  Musculoskeletal: Normal range of motion. She exhibits no edema and  no tenderness.  Neurological: She is alert. She has normal strength. No cranial nerve deficit or sensory deficit. She displays a negative Romberg sign. GCS eye subscore is 4. GCS verbal subscore is 5. GCS motor subscore is 6.  Reflex Scores:      Tricep reflexes are 2+ on the right side and 2+ on the left side.      Bicep reflexes are 2+ on the right side and 2+ on the left side.      Brachioradialis reflexes are 2+ on the right side and 2+ on the left side.      Patellar reflexes are 2+ on the right side and 2+ on the left side.      Achilles reflexes are 2+ on the right side and 2+ on the left side.   ED Course  Procedures (including critical care time)  Labs Reviewed  CBC WITH DIFFERENTIAL - Abnormal; Notable for the following:    WBC 13.7 (*)    Neutrophils Relative % 85 (*)    Neutro Abs 11.7 (*)    Lymphocytes Relative 9 (*)    All other components within normal limits  COMPREHENSIVE METABOLIC PANEL - Abnormal; Notable for the following:    Potassium 3.2 (*)    Glucose, Bld 101 (*)    BUN 24 (*)    Alkaline Phosphatase 123 (*)    Total Bilirubin 0.2 (*)    GFR calc non Af Amer 54 (*)    GFR calc Af Amer 62 (*)    All other components within normal limits  URINALYSIS, ROUTINE W REFLEX MICROSCOPIC - Abnormal; Notable for the following:    APPearance HAZY (*)    Hgb urine dipstick LARGE (*)    All other components within normal limits  URINE MICROSCOPIC-ADD ON - Abnormal; Notable for the following:    Squamous Epithelial / LPF FEW (*)    Bacteria, UA MANY (*)    Casts HYALINE CASTS (*)    All other components within normal limits  TROPONIN I  OCCULT BLOOD X 1 CARD TO LAB, STOOL   Dg Chest 2 View  08/16/2012   *RADIOLOGY REPORT*  Clinical Data: Dizzy with syncope.  CHEST - 2 VIEW  Comparison: 02/20/2010.  Findings: Normal heart size with clear lung fields.  Azygos  lobe. Minimal scoliosis convex left.  IMPRESSION: No active cardiopulmonary disease.   Original Report  Authenticated By: Davonna Belling, M.D.     No diagnosis found.   Date: 08/16/2012  Rate: 62  Rhythm: normal sinus rhythm  QRS Axis: normal  Intervals: normal  ST/T Wave abnormalities: nonspecific ST changes  Conduction Disutrbances:none  Narrative Interpretation:   Old EKG Reviewed: no change from first prior of 02/18/10  Results for orders placed during the hospital encounter of 08/16/12  CBC WITH DIFFERENTIAL      Result Value Range   WBC 13.7 (*) 4.0 - 10.5 K/uL   RBC 4.75  3.87 - 5.11 MIL/uL   Hemoglobin 14.4  12.0 - 15.0 g/dL   HCT 16.1  09.6 - 04.5 %   MCV 89.7  78.0 - 100.0 fL   MCH 30.3  26.0 - 34.0 pg   MCHC 33.8  30.0 - 36.0 g/dL   RDW 40.9  81.1 - 91.4 %   Platelets 311  150 - 400 K/uL   Neutrophils Relative % 85 (*) 43 - 77 %   Neutro Abs 11.7 (*) 1.7 - 7.7 K/uL   Lymphocytes Relative 9 (*) 12 - 46 %   Lymphs Abs 1.2  0.7 - 4.0 K/uL   Monocytes Relative 5  3 - 12 %   Monocytes Absolute 0.7  0.1 - 1.0 K/uL   Eosinophils Relative 0  0 - 5 %   Eosinophils Absolute 0.1  0.0 - 0.7 K/uL   Basophils Relative 0  0 - 1 %   Basophils Absolute 0.0  0.0 - 0.1 K/uL  COMPREHENSIVE METABOLIC PANEL      Result Value Range   Sodium 138  135 - 145 mEq/L   Potassium 3.2 (*) 3.5 - 5.1 mEq/L   Chloride 100  96 - 112 mEq/L   CO2 27  19 - 32 mEq/L   Glucose, Bld 101 (*) 70 - 99 mg/dL   BUN 24 (*) 6 - 23 mg/dL   Creatinine, Ser 7.82  0.50 - 1.10 mg/dL   Calcium 9.6  8.4 - 95.6 mg/dL   Total Protein 6.8  6.0 - 8.3 g/dL   Albumin 3.7  3.5 - 5.2 g/dL   AST 35  0 - 37 U/L   ALT 24  0 - 35 U/L   Alkaline Phosphatase 123 (*) 39 - 117 U/L   Total Bilirubin 0.2 (*) 0.3 - 1.2 mg/dL   GFR calc non Af Amer 54 (*) >90 mL/min   GFR calc Af Amer 62 (*) >90 mL/min  TROPONIN I      Result Value Range   Troponin I <0.30  <0.30 ng/mL  URINALYSIS, ROUTINE W REFLEX MICROSCOPIC      Result Value Range   Color, Urine YELLOW  YELLOW   APPearance HAZY (*) CLEAR   Specific Gravity, Urine 1.015   1.005 - 1.030   pH 6.0  5.0 - 8.0   Glucose, UA NEGATIVE  NEGATIVE mg/dL   Hgb urine dipstick LARGE (*) NEGATIVE   Bilirubin Urine NEGATIVE  NEGATIVE   Ketones, ur NEGATIVE  NEGATIVE mg/dL   Protein, ur NEGATIVE  NEGATIVE mg/dL   Urobilinogen, UA 0.2  0.0 - 1.0 mg/dL   Nitrite NEGATIVE  NEGATIVE   Leukocytes, UA NEGATIVE  NEGATIVE  URINE MICROSCOPIC-ADD ON      Result Value Range   Squamous Epithelial / LPF FEW (*) RARE   WBC, UA 0-2  <3 WBC/hpf   RBC /  HPF 7-10  <3 RBC/hpf   Bacteria, UA MANY (*) RARE   Casts HYALINE CASTS (*) NEGATIVE  OCCULT BLOOD, POC DEVICE      Result Value Range   Fecal Occult Bld POSITIVE (*) NEGATIVE       Near syncope- suspect multifactorial as patient on antihypertensive, took a vicodin and may have uti.  EKG stable, no chest pain.  Patient given iv fluids and afebrile here.  Plan antibiotics, encourage po intake and close follow up.  Patient with heme positive stool but normal hemoglobin and scheduled for screening colonoscopy July.          Hilario Quarry, MD 08/16/12 [phone]

## 2012-08-16 NOTE — ED Notes (Signed)
Hemoccult results are trace positive, recorded as positive.

## 2012-08-16 NOTE — ED Notes (Signed)
Per pt woke up this am and felt nausea, pale and weak. sts after breakfast went to have a BM and felt like she was going to pass out. CBG 131. Stroke scale negative. HR 90. 12 lead NSR. sts cool clammy, diaphoretic upon EMS arrival. IV 20 LFA. sts last night lower right back pain.

## 2012-08-16 NOTE — ED Notes (Signed)
Patient transported to X-ray 

## 2013-01-24 ENCOUNTER — Telehealth: Payer: Self-pay | Admitting: *Deleted

## 2013-01-24 NOTE — Telephone Encounter (Signed)
ERROR

## 2013-04-19 ENCOUNTER — Other Ambulatory Visit: Payer: Self-pay

## 2013-04-19 DIAGNOSIS — Z1231 Encounter for screening mammogram for malignant neoplasm of breast: Secondary | ICD-10-CM

## 2013-05-10 ENCOUNTER — Other Ambulatory Visit: Payer: Self-pay | Admitting: Gynecology

## 2013-05-17 ENCOUNTER — Encounter: Payer: BC Managed Care – PPO | Admitting: Gynecology

## 2013-05-18 ENCOUNTER — Encounter: Payer: BC Managed Care – PPO | Admitting: Gynecology

## 2013-05-20 ENCOUNTER — Ambulatory Visit (INDEPENDENT_AMBULATORY_CARE_PROVIDER_SITE_OTHER): Payer: BC Managed Care – PPO | Admitting: Gynecology

## 2013-05-20 ENCOUNTER — Encounter: Payer: Self-pay | Admitting: Gynecology

## 2013-05-20 ENCOUNTER — Other Ambulatory Visit (HOSPITAL_COMMUNITY)
Admission: RE | Admit: 2013-05-20 | Discharge: 2013-05-20 | Disposition: A | Discharge status: Home / Self Care | Payer: BC Managed Care – PPO | Source: Ambulatory Visit | Attending: Gynecology | Admitting: Gynecology

## 2013-05-20 VITALS — BP 120/76 | Ht 70.0 in | Wt 176.0 lb

## 2013-05-20 DIAGNOSIS — Z01419 Encounter for gynecological examination (general) (routine) without abnormal findings: Secondary | ICD-10-CM

## 2013-05-20 DIAGNOSIS — Z79890 Hormone replacement therapy (postmenopausal): Secondary | ICD-10-CM

## 2013-05-20 LAB — COMPREHENSIVE METABOLIC PANEL
ALK PHOS: 100 U/L (ref 39–117)
ALT: 28 U/L (ref 0–35)
AST: 35 U/L (ref 0–37)
Albumin: 4.2 g/dL (ref 3.5–5.2)
BILIRUBIN TOTAL: 0.4 mg/dL (ref 0.2–1.2)
BUN: 22 mg/dL (ref 6–23)
CO2: 28 meq/L (ref 19–32)
CREATININE: 0.95 mg/dL (ref 0.50–1.10)
Calcium: 9.3 mg/dL (ref 8.4–10.5)
Chloride: 105 mEq/L (ref 96–112)
Glucose, Bld: 90 mg/dL (ref 70–99)
Potassium: 4.4 mEq/L (ref 3.5–5.3)
Sodium: 141 mEq/L (ref 135–145)
TOTAL PROTEIN: 6.4 g/dL (ref 6.0–8.3)

## 2013-05-20 LAB — CBC WITH DIFFERENTIAL/PLATELET
BASOS ABS: 0.1 10*3/uL (ref 0.0–0.1)
Basophils Relative: 1 % (ref 0–1)
EOS ABS: 0.1 10*3/uL (ref 0.0–0.7)
EOS PCT: 2 % (ref 0–5)
HCT: 37.7 % (ref 36.0–46.0)
Hemoglobin: 13.1 g/dL (ref 12.0–15.0)
Lymphocytes Relative: 26 % (ref 12–46)
Lymphs Abs: 1.6 10*3/uL (ref 0.7–4.0)
MCH: 30.4 pg (ref 26.0–34.0)
MCHC: 34.7 g/dL (ref 30.0–36.0)
MCV: 87.5 fL (ref 78.0–100.0)
Monocytes Absolute: 0.5 10*3/uL (ref 0.1–1.0)
Monocytes Relative: 8 % (ref 3–12)
NEUTROS PCT: 63 % (ref 43–77)
Neutro Abs: 3.8 10*3/uL (ref 1.7–7.7)
PLATELETS: 333 10*3/uL (ref 150–400)
RBC: 4.31 MIL/uL (ref 3.87–5.11)
RDW: 13.7 % (ref 11.5–15.5)
WBC: 6 10*3/uL (ref 4.0–10.5)

## 2013-05-20 LAB — URINALYSIS W MICROSCOPIC + REFLEX CULTURE
BILIRUBIN URINE: NEGATIVE
Bacteria, UA: NONE SEEN
CASTS: NONE SEEN
Crystals: NONE SEEN
GLUCOSE, UA: NEGATIVE mg/dL
Ketones, ur: NEGATIVE mg/dL
Leukocytes, UA: NEGATIVE
Nitrite: NEGATIVE
PROTEIN: NEGATIVE mg/dL
Specific Gravity, Urine: 1.021 (ref 1.005–1.030)
Urobilinogen, UA: 0.2 mg/dL (ref 0.0–1.0)
pH: 6 (ref 5.0–8.0)

## 2013-05-20 LAB — LIPID PANEL
CHOL/HDL RATIO: 3.5 ratio
Cholesterol: 251 mg/dL — ABNORMAL HIGH (ref 0–200)
HDL: 71 mg/dL (ref >39)
LDL CALC: 156 mg/dL — AB (ref 0–99)
Triglycerides: 121 mg/dL (ref <150)
VLDL: 24 mg/dL (ref 0–40)

## 2013-05-20 LAB — TSH: TSH: 5.596 u[IU]/mL — AB (ref 0.350–4.500)

## 2013-05-20 MED ORDER — ESTRADIOL 1 MG PO TABS: 1.0000 mg | ORAL_TABLET | Freq: Every day | ORAL | Status: DC

## 2013-05-20 MED ORDER — ZOLPIDEM TARTRATE 5 MG PO TABS: 5.0000 mg | ORAL_TABLET | Freq: Every evening | ORAL | Status: DC | PRN

## 2013-05-20 NOTE — Progress Notes (Signed)
Pamela Whitehead [birth year] [id number]        62 y.o.  G1P0010 for annual exam.  Doing well without complaints.  Past medical history,surgical history, problem list, medications, allergies, family history and social history were all reviewed and documented in the EPIC chart.  ROS:  Performed and pertinent positives and negatives are included in the history, assessment and plan .  Exam: Kim assistant Filed Vitals:   05/20/13 0800  BP: 120/76  Height: 5\' 10"  (1.778 m)  Weight: 176 lb (79.833 kg)   General appearance  Normal Skin grossly normal Head/Neck normal with no cervical or supraclavicular adenopathy thyroid normal Lungs  clear Cardiac RR, without RMG Abdominal  soft, nontender, without masses, organomegaly or hernia Breasts  examined lying and sitting without masses, retractions, discharge or axillary adenopathy. Pelvic  Ext/BUS/vagina with atrophic changes. Pap of cuff done.  Adnexa  Without masses or tenderness    Anus and perineum  Normal   Rectovaginal  Normal sphincter tone without palpated masses or tenderness.    Assessment/Plan:  63 y.o. G1P0010 female for annual exam.   1. Postmenopausal/ERT. Patient continues on estradiol 1 mg daily.  I again reviewed the whole issue of HRT with her to include the WHI study with increased risk of stroke, heart attack, DVT and breast cancer. The ACOG and NAMS statements for lowest dose for the shortest period of time reviewed. Transdermal versus oral first-pass effect benefit discussed. Patient wants to continue it I refilled her x1 year.  2. Pap smear 2014. Pap of cuff done today. History of ascus x2 with negative high-risk HPV 2013. Prior history of VAIN 2001. 3. Mammography due now the patient knows to schedule this. SBE monthly reviewed. 4. DEXA 08/2012 normal. Repeated five-year interval. Increase calcium vitamin D reviewed. 5. Colonoscopy 2014 normal. Repeat at their recommended interval. 6. Occasional insomnia. Uses Ambien 5 mg  when necessary. #30 with one refill provided. 7. Health maintenance. Patient requested routine blood work. Is seeing her primary is fasting and once to get it done today. CBC comprehensive metabolic panel lipid profile urinalysis TSH vitamin D ordered. Followup in one year, sooner as needed.   Note: This document was prepared with digital dictation and possible smart phrase technology. Any transcriptional errors that result from this process are unintentional.   [name] MD, 8:31 AM 05/20/2013

## 2013-05-20 NOTE — Addendum Note (Signed)
Addended by: Nelva Nay on: 05/20/2013 08:38 AM   Modules accepted: Orders

## 2013-05-20 NOTE — Patient Instructions (Signed)
Followup in one year for annual exam, sooner if any problems.  Health Maintenance, Female A healthy lifestyle and preventative care can promote health and wellness.  Maintain regular health, dental, and eye exams.  Eat a healthy diet. Foods like vegetables, fruits, whole grains, low-fat dairy products, and lean protein foods contain the nutrients you need without too many calories. Decrease your intake of foods high in solid fats, added sugars, and salt. Get information about a proper diet from your caregiver, if necessary.  Regular physical exercise is one of the most important things you can do for your health. Most adults should get at least 150 minutes of moderate-intensity exercise (any activity that increases your heart rate and causes you to sweat) each week. In addition, most adults need muscle-strengthening exercises on 2 or more days a week.   Maintain a healthy weight. The body mass index (BMI) is a screening tool to identify possible weight problems. It provides an estimate of body fat based on height and weight. Your caregiver can help determine your BMI, and can help you achieve or maintain a healthy weight. For adults 20 years and older:  A BMI below 18.5 is considered underweight.  A BMI of 18.5 to 24.9 is normal.  A BMI of 25 to 29.9 is considered overweight.  A BMI of 30 and above is considered obese.  Maintain normal blood lipids and cholesterol by exercising and minimizing your intake of saturated fat. Eat a balanced diet with plenty of fruits and vegetables. Blood tests for lipids and cholesterol should begin at age 10 and be repeated every 5 years. If your lipid or cholesterol levels are high, you are over 50, or you are a high risk for heart disease, you may need your cholesterol levels checked more frequently.Ongoing high lipid and cholesterol levels should be treated with medicines if diet and exercise are not effective.  If you smoke, find out from your caregiver  how to quit. If you do not use tobacco, do not start.  Lung cancer screening is recommended for adults aged 25 80 years who are at high risk for developing lung cancer because of a history of smoking. Yearly low-dose computed tomography (CT) is recommended for people who have at least a 30-pack-year history of smoking and are a current smoker or have quit within the past 15 years. A pack year of smoking is smoking an average of 1 pack of cigarettes a day for 1 year (for example: 1 pack a day for 30 years or 2 packs a day for 15 years). Yearly screening should continue until the smoker has stopped smoking for at least 15 years. Yearly screening should also be stopped for people who develop a health problem that would prevent them from having lung cancer treatment.  If you are pregnant, do not drink alcohol. If you are breastfeeding, be very cautious about drinking alcohol. If you are not pregnant and choose to drink alcohol, do not exceed 1 drink per day. One drink is considered to be 12 ounces (355 mL) of beer, 5 ounces (148 mL) of wine, or 1.5 ounces (44 mL) of liquor.  Avoid use of street drugs. Do not share needles with anyone. Ask for help if you need support or instructions about stopping the use of drugs.  High blood pressure causes heart disease and increases the risk of stroke. Blood pressure should be checked at least every 1 to 2 years. Ongoing high blood pressure should be treated with medicines, if weight  loss and exercise are not effective.  If you are 55 to 63 years old, ask your caregiver if you should take aspirin to prevent strokes.  Diabetes screening involves taking a blood sample to check your fasting blood sugar level. This should be done once every 3 years, after age 45, if you are within normal weight and without risk factors for diabetes. Testing should be considered at a younger age or be carried out more frequently if you are overweight and have at least 1 risk factor for  diabetes.  Breast cancer screening is essential preventative care for women. You should practice "breast self-awareness." This means understanding the normal appearance and feel of your breasts and may include breast self-examination. Any changes detected, no matter how small, should be reported to a caregiver. Women in their 20s and 30s should have a clinical breast exam (CBE) by a caregiver as part of a regular health exam every 1 to 3 years. After age 40, women should have a CBE every year. Starting at age 40, women should consider having a mammogram (breast X-ray) every year. Women who have a family history of breast cancer should talk to their caregiver about genetic screening. Women at a high risk of breast cancer should talk to their caregiver about having an MRI and a mammogram every year.  Breast cancer gene (BRCA)-related cancer risk assessment is recommended for women who have family members with BRCA-related cancers. BRCA-related cancers include breast, ovarian, tubal, and peritoneal cancers. Having family members with these cancers may be associated with an increased risk for harmful changes (mutations) in the breast cancer genes BRCA1 and BRCA2. Results of the assessment will determine the need for genetic counseling and BRCA1 and BRCA2 testing.  The Pap test is a screening test for cervical cancer. Women should have a Pap test starting at age 21. Between ages 21 and 29, Pap tests should be repeated every 2 years. Beginning at age 30, you should have a Pap test every 3 years as long as the past 3 Pap tests have been normal. If you had a hysterectomy for a problem that was not cancer or a condition that could lead to cancer, then you no longer need Pap tests. If you are between ages 65 and 70, and you have had normal Pap tests going back 10 years, you no longer need Pap tests. If you have had past treatment for cervical cancer or a condition that could lead to cancer, you need Pap tests and  screening for cancer for at least 20 years after your treatment. If Pap tests have been discontinued, risk factors (such as a new sexual partner) need to be reassessed to determine if screening should be resumed. Some women have medical problems that increase the chance of getting cervical cancer. In these cases, your caregiver may recommend more frequent screening and Pap tests.  The human papillomavirus (HPV) test is an additional test that may be used for cervical cancer screening. The HPV test looks for the virus that can cause the cell changes on the cervix. The cells collected during the Pap test can be tested for HPV. The HPV test could be used to screen women aged 30 years and older, and should be used in women of any age who have unclear Pap test results. After the age of 30, women should have HPV testing at the same frequency as a Pap test.  Colorectal cancer can be detected and often prevented. Most routine colorectal cancer screening begins at   the age of 50 and continues through age 75. However, your caregiver may recommend screening at an earlier age if you have risk factors for colon cancer. On a yearly basis, your caregiver may provide home test kits to check for hidden blood in the stool. Use of a small camera at the end of a tube, to directly examine the colon (sigmoidoscopy or colonoscopy), can detect the earliest forms of colorectal cancer. Talk to your caregiver about this at age 50, when routine screening begins. Direct examination of the colon should be repeated every 5 to 10 years through age 75, unless early forms of pre-cancerous polyps or small growths are found.  Hepatitis C blood testing is recommended for all people born from 1945 through 1965 and any individual with known risks for hepatitis C.  Practice safe sex. Use condoms and avoid high-risk sexual practices to reduce the spread of sexually transmitted infections (STIs). Sexually active women aged 25 and younger should be  checked for Chlamydia, which is a common sexually transmitted infection. Older women with new or multiple partners should also be tested for Chlamydia. Testing for other STIs is recommended if you are sexually active and at increased risk.  Osteoporosis is a disease in which the bones lose minerals and strength with aging. This can result in serious bone fractures. The risk of osteoporosis can be identified using a bone density scan. Women ages 65 and over and women at risk for fractures or osteoporosis should discuss screening with their caregivers. Ask your caregiver whether you should be taking a calcium supplement or vitamin D to reduce the rate of osteoporosis.  Menopause can be associated with physical symptoms and risks. Hormone replacement therapy is available to decrease symptoms and risks. You should talk to your caregiver about whether hormone replacement therapy is right for you.  Use sunscreen. Apply sunscreen liberally and repeatedly throughout the day. You should seek shade when your shadow is shorter than you. Protect yourself by wearing long sleeves, pants, a wide-brimmed hat, and sunglasses year round, whenever you are outdoors.  Notify your caregiver of new moles or changes in moles, especially if there is a change in shape or color. Also notify your caregiver if a mole is larger than the size of a pencil eraser.  Stay current with your immunizations. Document Released: 09/09/2010 Document Revised: 06/21/2012 Document Reviewed: 09/09/2010 [employer] Patient Information 2014 [employer].  

## 2013-05-21 LAB — VITAMIN D 25 HYDROXY (VIT D DEFICIENCY, FRACTURES): VIT D 25 HYDROXY: 49 ng/mL (ref 30–89)

## 2013-05-23 ENCOUNTER — Encounter: Payer: Self-pay | Admitting: *Deleted

## 2013-05-24 ENCOUNTER — Other Ambulatory Visit: Payer: Self-pay | Admitting: Gynecology

## 2013-05-24 MED ORDER — FLUCONAZOLE 150 MG PO TABS: 150.0000 mg | ORAL_TABLET | Freq: Once | ORAL | Status: DC

## 2013-05-26 ENCOUNTER — Other Ambulatory Visit: Payer: Self-pay | Admitting: Family Medicine

## 2013-05-26 DIAGNOSIS — E049 Nontoxic goiter, unspecified: Secondary | ICD-10-CM

## 2013-05-30 ENCOUNTER — Ambulatory Visit
Admission: RE | Admit: 2013-05-30 | Discharge: 2013-05-30 | Disposition: A | Discharge status: Home / Self Care | Payer: Self-pay | Source: Ambulatory Visit

## 2013-05-30 DIAGNOSIS — Z1231 Encounter for screening mammogram for malignant neoplasm of breast: Secondary | ICD-10-CM

## 2013-05-31 ENCOUNTER — Ambulatory Visit
Admission: RE | Admit: 2013-05-31 | Discharge: 2013-05-31 | Disposition: A | Discharge status: Home / Self Care | Payer: BC Managed Care – PPO | Source: Ambulatory Visit | Attending: Family Medicine | Admitting: Family Medicine

## 2013-05-31 DIAGNOSIS — E049 Nontoxic goiter, unspecified: Secondary | ICD-10-CM

## 2013-06-03 ENCOUNTER — Encounter: Payer: Self-pay | Admitting: Gynecology

## 2013-12-11 ENCOUNTER — Emergency Department (HOSPITAL_COMMUNITY)
Admission: EM | Admit: 2013-12-11 | Discharge: 2013-12-11 | Disposition: A | Discharge status: Home / Self Care | Payer: BC Managed Care – PPO | Attending: Emergency Medicine | Admitting: Emergency Medicine

## 2013-12-11 ENCOUNTER — Emergency Department (HOSPITAL_COMMUNITY)
Admission: EM | Admit: 2013-12-11 | Discharge: 2013-12-11 | Disposition: A | Discharge status: Home / Self Care | Payer: BC Managed Care – PPO | Source: Home / Self Care | Attending: Emergency Medicine | Admitting: Emergency Medicine

## 2013-12-11 ENCOUNTER — Emergency Department (HOSPITAL_COMMUNITY): Payer: BC Managed Care – PPO

## 2013-12-11 ENCOUNTER — Encounter (HOSPITAL_COMMUNITY): Payer: Self-pay | Admitting: Emergency Medicine

## 2013-12-11 DIAGNOSIS — Z8742 Personal history of other diseases of the female genital tract: Secondary | ICD-10-CM | POA: Insufficient documentation

## 2013-12-11 DIAGNOSIS — Z872 Personal history of diseases of the skin and subcutaneous tissue: Secondary | ICD-10-CM | POA: Insufficient documentation

## 2013-12-11 DIAGNOSIS — I471 Supraventricular tachycardia: Secondary | ICD-10-CM | POA: Diagnosis not present

## 2013-12-11 DIAGNOSIS — Z880 Allergy status to penicillin: Secondary | ICD-10-CM | POA: Insufficient documentation

## 2013-12-11 DIAGNOSIS — Z8739 Personal history of other diseases of the musculoskeletal system and connective tissue: Secondary | ICD-10-CM | POA: Insufficient documentation

## 2013-12-11 DIAGNOSIS — Z8711 Personal history of peptic ulcer disease: Secondary | ICD-10-CM | POA: Diagnosis not present

## 2013-12-11 DIAGNOSIS — R110 Nausea: Secondary | ICD-10-CM

## 2013-12-11 DIAGNOSIS — E876 Hypokalemia: Secondary | ICD-10-CM | POA: Insufficient documentation

## 2013-12-11 DIAGNOSIS — I10 Essential (primary) hypertension: Secondary | ICD-10-CM

## 2013-12-11 DIAGNOSIS — R0602 Shortness of breath: Secondary | ICD-10-CM | POA: Insufficient documentation

## 2013-12-11 DIAGNOSIS — K279 Peptic ulcer, site unspecified, unspecified as acute or chronic, without hemorrhage or perforation: Secondary | ICD-10-CM | POA: Insufficient documentation

## 2013-12-11 DIAGNOSIS — Z87448 Personal history of other diseases of urinary system: Secondary | ICD-10-CM | POA: Diagnosis not present

## 2013-12-11 DIAGNOSIS — Z79899 Other long term (current) drug therapy: Secondary | ICD-10-CM | POA: Insufficient documentation

## 2013-12-11 DIAGNOSIS — Z87411 Personal history of vaginal dysplasia: Secondary | ICD-10-CM | POA: Insufficient documentation

## 2013-12-11 DIAGNOSIS — R42 Dizziness and giddiness: Secondary | ICD-10-CM

## 2013-12-11 DIAGNOSIS — R000 Tachycardia, unspecified: Secondary | ICD-10-CM | POA: Diagnosis present

## 2013-12-11 HISTORY — DX: Peptic ulcer, site unspecified, unspecified as acute or chronic, without hemorrhage or perforation: K27.9

## 2013-12-11 HISTORY — DX: Renal tubulo-interstitial disease, unspecified: N15.9

## 2013-12-11 LAB — BASIC METABOLIC PANEL
ANION GAP: 21 — AB (ref 5–15)
BUN: 20 mg/dL (ref 6–23)
CALCIUM: 9.7 mg/dL (ref 8.4–10.5)
CHLORIDE: 96 meq/L (ref 96–112)
CO2: 19 mEq/L (ref 19–32)
CREATININE: 1.2 mg/dL — AB (ref 0.50–1.10)
GFR calc non Af Amer: 47 mL/min — ABNORMAL LOW (ref >90)
GFR, EST AFRICAN AMERICAN: 55 mL/min — AB (ref >90)
Glucose, Bld: 140 mg/dL — ABNORMAL HIGH (ref 70–99)
Potassium: 3 mEq/L — ABNORMAL LOW (ref 3.7–5.3)
Sodium: 136 mEq/L — ABNORMAL LOW (ref 137–147)

## 2013-12-11 LAB — CBC
HCT: 44.7 % (ref 36.0–46.0)
Hemoglobin: 15.4 g/dL — ABNORMAL HIGH (ref 12.0–15.0)
MCH: 30.8 pg (ref 26.0–34.0)
MCHC: 34.5 g/dL (ref 30.0–36.0)
MCV: 89.4 fL (ref 78.0–100.0)
PLATELETS: 330 10*3/uL (ref 150–400)
RBC: 5 MIL/uL (ref 3.87–5.11)
RDW: 12.9 % (ref 11.5–15.5)
WBC: 10 10*3/uL (ref 4.0–10.5)

## 2013-12-11 LAB — TROPONIN I: Troponin I: <0.3 ng/mL (ref <0.30)

## 2013-12-11 MED ORDER — METOPROLOL SUCCINATE ER 25 MG PO TB24
25.0000 mg | ORAL_TABLET | Freq: Every day | ORAL | Status: DC
  Administered 2013-12-11: 25 mg via ORAL
  Filled 2013-12-11: qty 1

## 2013-12-11 MED ORDER — SODIUM CHLORIDE 0.9 % IV BOLUS (SEPSIS)
1000.0000 mL | Freq: Once | INTRAVENOUS | Status: AC
  Administered 2013-12-11: 1000 mL via INTRAVENOUS

## 2013-12-11 MED ORDER — POTASSIUM CHLORIDE CRYS ER 20 MEQ PO TBCR: 20.0000 meq | EXTENDED_RELEASE_TABLET | Freq: Every day | ORAL | Status: DC

## 2013-12-11 MED ORDER — POTASSIUM CHLORIDE CRYS ER 20 MEQ PO TBCR
40.0000 meq | EXTENDED_RELEASE_TABLET | Freq: Once | ORAL | Status: AC
  Administered 2013-12-11: 40 meq via ORAL
  Filled 2013-12-11: qty 2

## 2013-12-11 MED ORDER — METOPROLOL SUCCINATE ER 25 MG PO TB24: 25.0000 mg | ORAL_TABLET | Freq: Every day | ORAL | Status: DC

## 2013-12-11 MED ORDER — ONDANSETRON 4 MG PO TBDP
4.0000 mg | ORAL_TABLET | Freq: Once | ORAL | Status: AC
Start: 2013-12-11 — End: 2013-12-11
  Administered 2013-12-11: 4 mg via ORAL
  Filled 2013-12-11: qty 1

## 2013-12-11 NOTE — ED Notes (Signed)
Pt was d/c from this facility approx 1 hr ago. Pt sts that on the way home she had a "wave of nausea and thought I was going to throw up." Dr Regenia Skeeter at bedside and feels that the metoprolol that was given may have caused it. Pt denies light headedness, CP, SOB, diaphoresis. Pt is A&O and in NAD.

## 2013-12-11 NOTE — ED Notes (Signed)
Provided pt with [place] sandwich and water per Dr. Regenia Skeeter. Pt sts that she feels much better

## 2013-12-11 NOTE — Discharge Instructions (Signed)

## 2013-12-11 NOTE — ED Provider Notes (Signed)
CSN: [id number]     Arrival date & time 12/11/13  0844 History   First MD Initiated Contact with Patient 12/11/13 [id number]     Chief Complaint  Patient presents with  . Tachycardia  . Shortness of Breath  . Dizziness     (Consider location/radiation/quality/duration/timing/severity/associated sxs/prior Treatment) HPI 63 year old female presents with near-syncope. She states she woke up with a mild headache and took Tylenol. After she had a shower she started noticing her heart was racing and using lightheaded and short of breath. Denies chest pain or chest pressure. States this lasts about 5 minutes and then seemed to spontaneously resolve. It then recurred and so she drove herself into the hospital. She is having these symptoms when her EKG was obtained and then has begun to feel better. She has some transient lightheadedness but feels significantly improved. No more shortness of breath. Denies any leg swelling or leg pain. Was seen here in June 2014 for similar symptoms was diagnosed with a possible UTI but no other cause. Has rarely had this since then until about 2 weeks ago and has had multiple episodes. EKG was normal at her PCPs office.  Past Medical History  Diagnosis Date  . Endometriosis   . 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 Spondylisthesis   . 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 Hypertension   . Ulcer   . Anaphylactic reaction     Unknown cause  . Kidney infection     June 2014  . Peptic ulcer     currently Oct 2015   Past Surgical History  Procedure Laterality Date  . Knee surgery  2003  . Breast surgery  02/25/2010    left breast biopsy ..for left breast mass. . . for [place]  . Vaginal hysterectomy  1993    vaginal hysterectomy  . Ectopic pregnancy surgery    . Rectal surgery     Family History  Problem Relation Age of Onset  . Hypertension Mother   . Dementia Mother   . Heart disease Father    . Breast cancer Maternal Aunt     Age 32's  . Heart disease Brother    History  Substance Use Topics  . Smoking status: Never Smoker   . Smokeless tobacco: Not on file  . Alcohol Use: Yes     Comment: rare   OB History   Grav Para Term Preterm Abortions TAB SAB Ect Mult Living   1 0   1  0 1  0     Review of Systems  Constitutional: Negative for fever.  Respiratory: Positive for shortness of breath.   Cardiovascular: Positive for palpitations. Negative for chest pain and leg swelling.  Gastrointestinal: Negative for abdominal pain.  Genitourinary: Negative for dysuria.  Neurological: Positive for light-headedness. Negative for weakness and numbness.  All other systems reviewed and are negative.     Allergies  Celebrex and Penicillins  Home Medications   Prior to Admission medications   Medication Sig Start Date End Date Taking? Authorizing Provider  butalbital-acetaminophen-caffeine (FIORICET, ESGIC) 50-325-40 MG per tablet Take 1 tablet by mouth 2 (two) times daily as needed for headache.  05/10/12   Historical Provider, MD  cetirizine (ZYRTEC) 10 MG tablet Take 10 mg by mouth daily.    Historical Provider, MD  EPINEPHrine (EPI-PEN) 0.3 mg/0.3 mL DEVI Inject 0.3 mLs (0.3 mg total) into the muscle as needed. 05/14/12   Timothy P Fontaine,  MD  estradiol (ESTRACE) 1 MG tablet Take 1 tablet (1 mg total) by mouth daily. 05/20/13   [name], MD  fluconazole (DIFLUCAN) 150 MG tablet Take 1 tablet (150 mg total) by mouth once. 05/24/13   [name], MD  hydrochlorothiazide (MICROZIDE) 12.5 MG capsule Take 12.5 mg by mouth daily.    Historical Provider, MD  RABEprazole (ACIPHEX) 20 MG tablet Take 20 mg by mouth daily.    Historical Provider, MD  valsartan (DIOVAN) 40 MG tablet Take 40 mg by mouth daily.    Historical Provider, MD  zolpidem (AMBIEN) 5 MG tablet Take 1 tablet (5 mg total) by mouth at bedtime as needed. 05/20/13   [name], MD   BP 157/76   Pulse 92  Temp(Src) 98.2 F (36.8 C) (Oral)  Resp 18  SpO2 100% Physical Exam  Nursing note and vitals reviewed. Constitutional: She is oriented to person, place, and time. She appears well-developed and well-nourished. No distress.  HENT:  Head: Normocephalic and atraumatic.  Right Ear: External ear normal.  Left Ear: External ear normal.  Nose: Nose normal.  Eyes: Right eye exhibits no discharge. Left eye exhibits no discharge.  Cardiovascular: Regular rhythm and normal heart sounds.  Tachycardia present.   Mild tachycardia, HR ~100  Pulmonary/Chest: Effort normal and breath sounds normal. She has no wheezes. She has no rales.  Abdominal: Soft. She exhibits no distension. There is no tenderness.  Neurological: She is alert and oriented to person, place, and time.  Normal strength and sensation in all 4 extremities  Skin: Skin is warm and dry. She is not diaphoretic.    ED Course  Procedures (including critical care time) Labs Review Labs Reviewed  CBC - Abnormal; Notable for the following:    Hemoglobin 15.4 (*)    All other components within normal limits  BASIC METABOLIC PANEL - Abnormal; Notable for the following:    Sodium 136 (*)    Potassium 3.0 (*)    Glucose, Bld 140 (*)    Creatinine, Ser 1.20 (*)    GFR calc non Af Amer 47 (*)    GFR calc Af Amer 55 (*)    Anion gap 21 (*)    All other components within normal limits  TROPONIN I    Imaging Review Dg Chest 2 View  12/11/2013   CLINICAL DATA:  TACHYCARDIA SHORTNESS OF BREATH DIZZINESS  EXAM: CHEST - 2 VIEW  COMPARISON:  08/16/2012  FINDINGS: Lungs are clear. Heart size and mediastinal contours are within normal limits. No effusion. Visualized skeletal structures are unremarkable.  IMPRESSION: No acute cardiopulmonary disease.   Electronically Signed   By: Arne Cleveland M.D.   On: 12/11/2013 09:50     EKG Interpretation   Date/Time:  Sunday December 11 2013 08:51:15 EDT Ventricular Rate:  143 PR Interval:   108 QRS Duration: 92 QT Interval:  277 QTC Calculation: 427 R Axis:   80 Text Interpretation:  Sinus tachycardia vs atrial flutter RSR' in V1 or  V2, right VCD or RVH rate significantly increased since 2014 Confirmed by  Jacarie Pate  MD, [place] ([id number]) on 12/11/2013 9:14:22 AM      MDM   Final diagnoses:  Atrial tachycardia  Hypokalemia    Patient feels significantly improved since her tachycardia spontaneously resolved. She does have hypokalemia, given po here and will d/c with replacement. Has had diarrhea the last few days that could be contributing. I reviewed her EKGs with Dr. Marlou Porch, who feels  this is either aflutter 2:1 or paroxysmal atrial tachycardia, favoring the latter though it is difficult to say. Given this, recommends 25 mg toprol XL and f/u with PCP. If patient's symptoms continue will be referred to cardiology. Patient feels well currently and has normal heart rhythm with HR in 80s. No anginal symptoms. Feel she is stable for discharge. Given strict return precautions    Ephraim Hamburger, MD 12/11/13 1110

## 2013-12-11 NOTE — ED Notes (Signed)
Pt PCP at bedside

## 2013-12-11 NOTE — ED Notes (Addendum)
Pt reports similar incidents of tachycardia, dizziness, and SOB.  In June pt had kidney infection. Pt has peptic ulcer. Reports she saw a doctor 2 weeks ago and had unremarkable EKG. At present pt denies pain. Pt able to speak in full sentences. Pt reports she work up with a headache, took a shower, and then 45 min ago heart started racing. Pt took 2 excedrine without aspirin at 0530.

## 2013-12-11 NOTE — Discharge Instructions (Signed)
Nonspecific Tachycardia Tachycardia is a faster than normal heartbeat (more than 100 beats per minute). In adults, the heart normally beats between 60 and 100 times a minute. A fast heartbeat may be a normal response to exercise or stress. It does not necessarily mean that something is wrong. However, sometimes when your heart beats too fast it may not be able to pump enough blood to the rest of your body. This can result in chest pain, shortness of breath, dizziness, and even fainting. Nonspecific tachycardia means that the specific cause or pattern of your tachycardia is unknown. CAUSES  Tachycardia may be harmless or it may be due to a more serious underlying cause. Possible causes of tachycardia include:  Exercise or exertion.  Fever.  Pain or injury.  Infection.  Loss of body fluids (dehydration).  Overactive thyroid.  Lack of red blood cells (anemia).  Anxiety and stress.  Alcohol.  Caffeine.  Tobacco products.  Diet pills.  Illegal drugs.  Heart disease. SYMPTOMS  Rapid or irregular heartbeat (palpitations).  Suddenly feeling your heart beating (cardiac awareness).  Dizziness.  Tiredness (fatigue).  Shortness of breath.  Chest pain.  Nausea.  Fainting. DIAGNOSIS  Your caregiver will perform a physical exam and take your medical history. In some cases, a heart specialist (cardiologist) may be consulted. Your caregiver may also order:  Blood tests.  Electrocardiography. This test records the electrical activity of your heart.  A heart monitoring test. TREATMENT  Treatment will depend on the likely cause of your tachycardia. The goal is to treat the underlying cause of your tachycardia. Treatment methods may include:  Replacement of fluids or blood through an intravenous (IV) tube for moderate to severe dehydration or anemia.  New medicines or changes in your current medicines.  Diet and lifestyle changes.  Treatment for certain  infections.  Stress relief or relaxation methods. HOME CARE INSTRUCTIONS   Rest.  Drink enough fluids to keep your urine clear or pale yellow.  Do not smoke.  Avoid:  Caffeine.  Tobacco.  Alcohol.  Chocolate.  Stimulants such as over-the-counter diet pills or pills that help you stay awake.  Situations that cause anxiety or stress.  Illegal drugs such as marijuana, phencyclidine (PCP), and cocaine.  Only take medicine as directed by your caregiver.  Keep all follow-up appointments as directed by your caregiver. SEEK IMMEDIATE MEDICAL CARE IF:   You have pain in your chest, upper arms, jaw, or neck.  You become weak, dizzy, or feel faint.  You have palpitations that will not go away.  You vomit, have diarrhea, or pass blood in your stool.  Your skin is cool, pale, and wet.  You have a fever that will not go away with rest, fluids, and medicine. MAKE SURE YOU:   Understand these instructions.  Will watch your condition.  Will get help right away if you are not doing well or get worse. Document Released: 04/03/2004 Document Revised: 05/19/2011 Document Reviewed: 02/04/2011 [facility] Patient Information 2015 [place], [place]. This information is not intended to replace advice given to you by your health care provider. Make sure you discuss any questions you have with your health care provider.      Hypokalemia Hypokalemia means that the amount of potassium in the blood is lower than normal.Potassium is a chemical, called an electrolyte, that helps regulate the amount of fluid in the body. It also stimulates muscle contraction and helps nerves function properly.Most of the body's potassium is inside of cells, and only a very  small amount is in the blood. Because the amount in the blood is so small, minor changes can be life-threatening. CAUSES  Antibiotics.  Diarrhea or vomiting.  Using laxatives too much, which can cause diarrhea.  Chronic kidney  disease.  Water pills (diuretics).  Eating disorders (bulimia).  Low magnesium level.  Sweating a lot. SIGNS AND SYMPTOMS  Weakness.  Constipation.  Fatigue.  Muscle cramps.  Mental confusion.  Skipped heartbeats or irregular heartbeat (palpitations).  Tingling or numbness. DIAGNOSIS  Your health care provider can diagnose hypokalemia with blood tests. In addition to checking your potassium level, your health care provider may also check other lab tests. TREATMENT Hypokalemia can be treated with potassium supplements taken by mouth or adjustments in your current medicines. If your potassium level is very low, you may need to get potassium through a vein (IV) and be monitored in the hospital. A diet high in potassium is also helpful. Foods high in potassium are:  Nuts, such as peanuts and pistachios.  Seeds, such as sunflower seeds and pumpkin seeds.  Peas, lentils, and lima beans.  Whole grain and bran cereals and breads.  Fresh fruit and vegetables, such as apricots, avocado, bananas, cantaloupe, kiwi, oranges, tomatoes, asparagus, and potatoes.  Orange and tomato juices.  Red meats.  Fruit yogurt. HOME CARE INSTRUCTIONS  Take all medicines as prescribed by your health care provider.  Maintain a healthy diet by including nutritious food, such as fruits, vegetables, nuts, whole grains, and lean meats.  If you are taking a laxative, be sure to follow the directions on the label. SEEK MEDICAL CARE IF:  Your weakness gets worse.  You feel your heart pounding or racing.  You are vomiting or having diarrhea.  You are diabetic and having trouble keeping your blood glucose in the normal range. SEEK IMMEDIATE MEDICAL CARE IF:  You have chest pain, shortness of breath, or dizziness.  You are vomiting or having diarrhea for more than 2 days.  You faint. MAKE SURE YOU:   Understand these instructions.  Will watch your condition.  Will get help right away  if you are not doing well or get worse. Document Released: 02/24/2005 Document Revised: 12/15/2012 Document Reviewed: 08/27/2012 [facility] Patient Information 2015 [place], [place]. This information is not intended to replace advice given to you by your health care provider. Make sure you discuss any questions you have with your health care provider.

## 2013-12-11 NOTE — ED Provider Notes (Signed)
CSN: [id number]     Arrival date & time 12/11/13  1128 History   First MD Initiated Contact with Patient 12/11/13 1131     Chief Complaint  Patient presents with  . Nausea     (Consider location/radiation/quality/duration/timing/severity/associated sxs/prior Treatment) HPI 63 year old female presents with nausea that started about 30 minutes after taking potassium and metoprolol XL while in the emergency department. I saw this patient about one hour ago for likely paroxysmal atrial tachycardia. She states on the ride home she started feeling nauseous and lightheaded. Did not feel repeat palpitations. Denied feeling any chest pain or shortness of breath. She went home and had a bowel movement it was not diarrhea but did not feel better and called her doctor. She then drove back to the ER. Currently she does feel less dizzy now but still feels a little bit nauseous. Denies any abdominal pain. She did not eat breakfast this morning.  Past Medical History  Diagnosis Date  . Endometriosis   . 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 Spondylisthesis   . 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 Hypertension   . Ulcer   . Anaphylactic reaction     Unknown cause  . Kidney infection     June 2014  . Peptic ulcer     currently Oct 2015   Past Surgical History  Procedure Laterality Date  . Knee surgery  2003  . Breast surgery  02/25/2010    left breast biopsy ..for left breast mass. . . for [place]  . Vaginal hysterectomy  1993    vaginal hysterectomy  . Ectopic pregnancy surgery    . Rectal surgery     Family History  Problem Relation Age of Onset  . Hypertension Mother   . Dementia Mother   . Heart disease Father   . Breast cancer Maternal Aunt     Age 53's  . Heart disease Brother    History  Substance Use Topics  . Smoking status: Never Smoker   . Smokeless tobacco: Not on file  . Alcohol Use: Yes     Comment: rare    OB History   Grav Para Term Preterm Abortions TAB SAB Ect Mult Living   1 0   1  0 1  0     Review of Systems  Respiratory: Negative for shortness of breath.   Cardiovascular: Negative for chest pain and palpitations.  Gastrointestinal: Positive for nausea. Negative for vomiting and abdominal pain.  Neurological: Positive for dizziness.  All other systems reviewed and are negative.     Allergies  Celebrex and Penicillins  Home Medications   Prior to Admission medications   Medication Sig Start Date End Date Taking? Authorizing Provider  Acetaminophen-Caffeine (EXCEDRIN ASPIRIN FREE PO) Take 2 tablets by mouth every 6 (six) hours as needed (for headache).    Historical Provider, MD  ALPRAZolam Duanne Moron) 1 MG tablet Take 0.25 mg by mouth at bedtime as needed for anxiety.    Historical Provider, MD  cetirizine (ZYRTEC) 10 MG tablet Take 5 mg by mouth daily.     Historical Provider, MD  EPINEPHrine (EPI-PEN) 0.3 mg/0.3 mL DEVI Inject 0.3 mLs (0.3 mg total) into the muscle as needed. 05/14/12   [name], MD  estradiol (ESTRACE) 1 MG tablet Take 1 tablet (1 mg total) by mouth daily. 05/20/13   [name], MD  hydrochlorothiazide (MICROZIDE) 12.5 MG capsule Take 12.5 mg by  mouth daily.    Historical Provider, MD  metoprolol succinate (TOPROL-XL) 25 MG 24 hr tablet Take 1 tablet (25 mg total) by mouth daily. 12/11/13   Ephraim Hamburger, MD  potassium chloride SA (K-DUR,KLOR-CON) 20 MEQ tablet Take 1 tablet (20 mEq total) by mouth daily. 12/11/13   Ephraim Hamburger, MD  RABEprazole (ACIPHEX) 20 MG tablet Take 20 mg by mouth daily.    Historical Provider, MD  valsartan (DIOVAN) 40 MG tablet Take 40 mg by mouth daily.    Historical Provider, MD  zolpidem (AMBIEN) 5 MG tablet Take 2.5 mg by mouth at bedtime as needed for sleep.    Historical Provider, MD   BP 149/73  Pulse 77  Temp(Src) 98 F (36.7 C) (Oral)  Resp 20  SpO2 100% Physical Exam  Nursing note and vitals  reviewed. Constitutional: She is oriented to person, place, and time. She appears well-developed and well-nourished. No distress.  HENT:  Head: Normocephalic and atraumatic.  Right Ear: External ear normal.  Left Ear: External ear normal.  Nose: Nose normal.  Eyes: Right eye exhibits no discharge. Left eye exhibits no discharge.  Cardiovascular: Normal rate, regular rhythm and normal heart sounds.   Pulmonary/Chest: Effort normal and breath sounds normal.  Abdominal: Soft. She exhibits no distension. There is no tenderness.  Neurological: She is alert and oriented to person, place, and time.  Skin: Skin is warm and dry. She is not diaphoretic.    ED Course  Procedures (including critical care time) Labs Review Labs Reviewed - No data to display  Imaging Review Dg Chest 2 View  12/11/2013   CLINICAL DATA:  TACHYCARDIA SHORTNESS OF BREATH DIZZINESS  EXAM: CHEST - 2 VIEW  COMPARISON:  08/16/2012  FINDINGS: Lungs are clear. Heart size and mediastinal contours are within normal limits. No effusion. Visualized skeletal structures are unremarkable.  IMPRESSION: No acute cardiopulmonary disease.   Electronically Signed   By: Arne Cleveland M.D.   On: 12/11/2013 09:50     EKG Interpretation   Date/Time:  Sunday December 11 2013 12:05:27 EDT Ventricular Rate:  77 PR Interval:  196 QRS Duration: 87 QT Interval:  418 QTC Calculation: 473 R Axis:   78 Text Interpretation:  Normal sinus rhythm Anteroseptal infarct, old No  significant change since last tracing Confirmed by McKean  ([id number]) on 12/11/2013 12:36:00 PM      MDM   Final diagnoses:  Nausea    I believe patient's nausea was from taking the potassium and metoprolol on an empty stomach. Symptoms resolved now. Repeat EKG benign. No signs of recurrence of tachycardia. No anginal symptoms. Ate and drank here and feels well. Stable for d/c.    Ephraim Hamburger, MD 12/11/13 1255

## 2014-01-09 ENCOUNTER — Encounter (HOSPITAL_COMMUNITY): Payer: Self-pay | Admitting: Emergency Medicine

## 2014-01-11 ENCOUNTER — Encounter: Payer: Self-pay | Admitting: Gynecology

## 2014-04-25 ENCOUNTER — Other Ambulatory Visit: Payer: Self-pay

## 2014-04-25 DIAGNOSIS — Z1231 Encounter for screening mammogram for malignant neoplasm of breast: Secondary | ICD-10-CM

## 2014-05-24 ENCOUNTER — Encounter: Payer: Self-pay | Admitting: Gynecology

## 2014-05-24 ENCOUNTER — Ambulatory Visit (INDEPENDENT_AMBULATORY_CARE_PROVIDER_SITE_OTHER): Payer: BLUE CROSS/BLUE SHIELD | Admitting: Gynecology

## 2014-05-24 VITALS — BP 140/90 | Ht 70.0 in | Wt 175.0 lb

## 2014-05-24 DIAGNOSIS — Z79890 Hormone replacement therapy (postmenopausal): Secondary | ICD-10-CM | POA: Diagnosis not present

## 2014-05-24 DIAGNOSIS — Z01419 Encounter for gynecological examination (general) (routine) without abnormal findings: Secondary | ICD-10-CM | POA: Diagnosis not present

## 2014-05-24 MED ORDER — ZOLPIDEM TARTRATE 5 MG PO TABS: 5.0000 mg | ORAL_TABLET | Freq: Every evening | ORAL | Status: DC | PRN

## 2014-05-24 MED ORDER — EPINEPHRINE 0.3 MG/0.3ML IJ SOAJ: 0.3000 mg | Freq: Once | INTRAMUSCULAR | Status: DC

## 2014-05-24 MED ORDER — FLUCONAZOLE 150 MG PO TABS: 150.0000 mg | ORAL_TABLET | Freq: Once | ORAL | Status: DC

## 2014-05-24 MED ORDER — ESTRADIOL 1 MG PO TABS: 1.0000 mg | ORAL_TABLET | Freq: Every day | ORAL | Status: DC

## 2014-05-24 NOTE — Progress Notes (Signed)
Pamela Whitehead [birth year] [id number]        63 y.o.  G1P0010 for annual exam.  Several issues noted below.  Past medical history,surgical history, problem list, medications, allergies, family history and social history were all reviewed and documented as reviewed in the EPIC chart.  ROS:  Performed with pertinent positives and negatives included in the history, assessment and plan.   Additional significant findings :  none   Exam: Kim Counsellor Vitals:   05/24/14 0952  BP: 140/90  Height: 5\' 10"  (1.778 m)  Weight: 175 lb (79.379 kg)   General appearance:  Normal affect, orientation and appearance. Skin: Grossly normal HEENT: Without gross lesions.  No cervical or supraclavicular adenopathy. Thyroid normal.  Lungs:  Clear without wheezing, rales or rhonchi Cardiac: RR, without RMG Abdominal:  Soft, nontender, without masses, guarding, rebound, organomegaly or hernia Breasts:  Examined lying and sitting without masses, retractions, discharge or axillary adenopathy. Pelvic:  Ext/BUS/vagina with atrophic changes  Adnexa  Without masses or tenderness    Anus and perineum  Normal   Rectovaginal  Normal sphincter tone without palpated masses or tenderness.    Assessment/Plan:  64 y.o. G1P0010 female for annual exam.   1. Postmenopausal/atrophic genital changes. Vaginal hysterectomy 1993. On Estrace 1 mg daily doing well wants to continue. I reviewed the risk of ERT to include the WHI study with increased risk of stroke heart attack DVT, possible breast cancer. ACOG and NAMS statements for lowest dose for shortest period of time. Options to wean versus continuing discussed. Patient very strongly wants to continue. I did ask her to reduce her dose to 0.5 mg and see how she does. Assume she does well she'll stay on that for this year. We have discussed the advantages of transdermal previously and she declined. I refilled her Estrace at 1 mg 1 year in the event she does not tolerate the  decreased dose to have this available. 2. Pap smear 2015. No Pap smear done today. History of ASCUS 2 with negative HPV 2013.  VAIN 2001. We will continue to screen at 3 year intervals. 3. Mammography scheduled next week. SBE monthly reviewed. 4. DEXA 2014 normal. Will repeat at 5 year interval.  Increased calcium vitamin D reviewed. 5. Colonoscopy 2014. Repeat at their recommended interval. 6. Occasional insomnia. Ambien 5 mg #30 with refill provided. 7. EpiPen refill provided with 2 refills. 8. Diflucan 150 mg #2 with 2 refills due to occasional yeast following exercise. 9. Health maintenance. No routine blood work done today as she is actively followed by her primary for her medical issues and has her blood work done at his office. Follow up in one year, sooner as needed.     [name] MD, 10:24 AM 05/24/2014

## 2014-05-24 NOTE — Patient Instructions (Signed)
You may obtain a copy of any labs that were done today by logging onto MyChart as outlined in the instructions provided with your AVS (after visit summary). The office will not call with normal lab results but certainly if there are any significant abnormalities then we will contact you.   Health Maintenance, Female A healthy lifestyle and preventative care can promote health and wellness.  Maintain regular health, dental, and eye exams.  Eat a healthy diet. Foods like vegetables, fruits, whole grains, low-fat dairy products, and lean protein foods contain the nutrients you need without too many calories. Decrease your intake of foods high in solid fats, added sugars, and salt. Get information about a proper diet from your caregiver, if necessary.  Regular physical exercise is one of the most important things you can do for your health. Most adults should get at least 150 minutes of moderate-intensity exercise (any activity that increases your heart rate and causes you to sweat) each week. In addition, most adults need muscle-strengthening exercises on 2 or more days a week.   Maintain a healthy weight. The body mass index (BMI) is a screening tool to identify possible weight problems. It provides an estimate of body fat based on height and weight. Your caregiver can help determine your BMI, and can help you achieve or maintain a healthy weight. For adults 20 years and older:  A BMI below 18.5 is considered underweight.  A BMI of 18.5 to 24.9 is normal.  A BMI of 25 to 29.9 is considered overweight.  A BMI of 30 and above is considered obese.  Maintain normal blood lipids and cholesterol by exercising and minimizing your intake of saturated fat. Eat a balanced diet with plenty of fruits and vegetables. Blood tests for lipids and cholesterol should begin at age 61 and be repeated every 5 years. If your lipid or cholesterol levels are high, you are over 50, or you are a high risk for heart  disease, you may need your cholesterol levels checked more frequently.Ongoing high lipid and cholesterol levels should be treated with medicines if diet and exercise are not effective.  If you smoke, find out from your caregiver how to quit. If you do not use tobacco, do not start.  Lung cancer screening is recommended for adults aged 33 80 years who are at high risk for developing lung cancer because of a history of smoking. Yearly low-dose computed tomography (CT) is recommended for people who have at least a 30-pack-year history of smoking and are a current smoker or have quit within the past 15 years. A pack year of smoking is smoking an average of 1 pack of cigarettes a day for 1 year (for example: 1 pack a day for 30 years or 2 packs a day for 15 years). Yearly screening should continue until the smoker has stopped smoking for at least 15 years. Yearly screening should also be stopped for people who develop a health problem that would prevent them from having lung cancer treatment.  If you are pregnant, do not drink alcohol. If you are breastfeeding, be very cautious about drinking alcohol. If you are not pregnant and choose to drink alcohol, do not exceed 1 drink per day. One drink is considered to be 12 ounces (355 mL) of beer, 5 ounces (148 mL) of wine, or 1.5 ounces (44 mL) of liquor.  Avoid use of street drugs. Do not share needles with anyone. Ask for help if you need support or instructions about stopping  the use of drugs.  High blood pressure causes heart disease and increases the risk of stroke. Blood pressure should be checked at least every 1 to 2 years. Ongoing high blood pressure should be treated with medicines, if weight loss and exercise are not effective.  If you are 59 to 64 years old, ask your caregiver if you should take aspirin to prevent strokes.  Diabetes screening involves taking a blood sample to check your fasting blood sugar level. This should be done once every 3  years, after age 91, if you are within normal weight and without risk factors for diabetes. Testing should be considered at a younger age or be carried out more frequently if you are overweight and have at least 1 risk factor for diabetes.  Breast cancer screening is essential preventative care for women. You should practice "breast self-awareness." This means understanding the normal appearance and feel of your breasts and may include breast self-examination. Any changes detected, no matter how small, should be reported to a caregiver. Women in their 66s and 30s should have a clinical breast exam (CBE) by a caregiver as part of a regular health exam every 1 to 3 years. After age 101, women should have a CBE every year. Starting at age 100, women should consider having a mammogram (breast X-ray) every year. Women who have a family history of breast cancer should talk to their caregiver about genetic screening. Women at a high risk of breast cancer should talk to their caregiver about having an MRI and a mammogram every year.  Breast cancer gene (BRCA)-related cancer risk assessment is recommended for women who have family members with BRCA-related cancers. BRCA-related cancers include breast, ovarian, tubal, and peritoneal cancers. Having family members with these cancers may be associated with an increased risk for harmful changes (mutations) in the breast cancer genes BRCA1 and BRCA2. Results of the assessment will determine the need for genetic counseling and BRCA1 and BRCA2 testing.  The Pap test is a screening test for cervical cancer. Women should have a Pap test starting at age 57. Between ages 25 and 35, Pap tests should be repeated every 2 years. Beginning at age 37, you should have a Pap test every 3 years as long as the past 3 Pap tests have been normal. If you had a hysterectomy for a problem that was not cancer or a condition that could lead to cancer, then you no longer need Pap tests. If you are  between ages 50 and 76, and you have had normal Pap tests going back 10 years, you no longer need Pap tests. If you have had past treatment for cervical cancer or a condition that could lead to cancer, you need Pap tests and screening for cancer for at least 20 years after your treatment. If Pap tests have been discontinued, risk factors (such as a new sexual partner) need to be reassessed to determine if screening should be resumed. Some women have medical problems that increase the chance of getting cervical cancer. In these cases, your caregiver may recommend more frequent screening and Pap tests.  The human papillomavirus (HPV) test is an additional test that may be used for cervical cancer screening. The HPV test looks for the virus that can cause the cell changes on the cervix. The cells collected during the Pap test can be tested for HPV. The HPV test could be used to screen women aged 44 years and older, and should be used in women of any age  who have unclear Pap test results. After the age of 55, women should have HPV testing at the same frequency as a Pap test.  Colorectal cancer can be detected and often prevented. Most routine colorectal cancer screening begins at the age of 44 and continues through age 20. However, your caregiver may recommend screening at an earlier age if you have risk factors for colon cancer. On a yearly basis, your caregiver may provide home test kits to check for hidden blood in the stool. Use of a small camera at the end of a tube, to directly examine the colon (sigmoidoscopy or colonoscopy), can detect the earliest forms of colorectal cancer. Talk to your caregiver about this at age 86, when routine screening begins. Direct examination of the colon should be repeated every 5 to 10 years through age 13, unless early forms of pre-cancerous polyps or small growths are found.  Hepatitis C blood testing is recommended for all people born from [birth year] through 1965 and any  individual with known risks for hepatitis C.  Practice safe sex. Use condoms and avoid high-risk sexual practices to reduce the spread of sexually transmitted infections (STIs). Sexually active women aged 36 and younger should be checked for Chlamydia, which is a common sexually transmitted infection. Older women with new or multiple partners should also be tested for Chlamydia. Testing for other STIs is recommended if you are sexually active and at increased risk.  Osteoporosis is a disease in which the bones lose minerals and strength with aging. This can result in serious bone fractures. The risk of osteoporosis can be identified using a bone density scan. Women ages 20 and over and women at risk for fractures or osteoporosis should discuss screening with their caregivers. Ask your caregiver whether you should be taking a calcium supplement or vitamin D to reduce the rate of osteoporosis.  Menopause can be associated with physical symptoms and risks. Hormone replacement therapy is available to decrease symptoms and risks. You should talk to your caregiver about whether hormone replacement therapy is right for you.  Use sunscreen. Apply sunscreen liberally and repeatedly throughout the day. You should seek shade when your shadow is shorter than you. Protect yourself by wearing long sleeves, pants, a wide-brimmed hat, and sunglasses year round, whenever you are outdoors.  Notify your caregiver of new moles or changes in moles, especially if there is a change in shape or color. Also notify your caregiver if a mole is larger than the size of a pencil eraser.  Stay current with your immunizations. Document Released: 09/09/2010 Document Revised: 06/21/2012 Document Reviewed: 09/09/2010 [facility] Patient Information 2014 [employer].

## 2014-05-25 ENCOUNTER — Other Ambulatory Visit: Payer: Self-pay | Admitting: Gynecology

## 2014-05-25 DIAGNOSIS — R319 Hematuria, unspecified: Secondary | ICD-10-CM

## 2014-05-25 LAB — URINALYSIS W MICROSCOPIC + REFLEX CULTURE
BACTERIA UA: NONE SEEN
Bilirubin Urine: NEGATIVE
CRYSTALS: NONE SEEN
Casts: NONE SEEN
Glucose, UA: NEGATIVE mg/dL
KETONES UR: NEGATIVE mg/dL
Leukocytes, UA: NEGATIVE
NITRITE: NEGATIVE
Protein, ur: NEGATIVE mg/dL
SPECIFIC GRAVITY, URINE: 1.026 (ref 1.005–1.030)
SQUAMOUS EPITHELIAL / LPF: NONE SEEN
Urobilinogen, UA: 0.2 mg/dL (ref 0.0–1.0)
pH: 5.5 (ref 5.0–8.0)

## 2014-05-26 LAB — URINE CULTURE
Colony Count: NO GROWTH
Organism ID, Bacteria: NO GROWTH

## 2014-06-01 ENCOUNTER — Ambulatory Visit
Admission: RE | Admit: 2014-06-01 | Discharge: 2014-06-01 | Disposition: A | Discharge status: Home / Self Care | Payer: BLUE CROSS/BLUE SHIELD | Source: Ambulatory Visit

## 2014-06-01 ENCOUNTER — Other Ambulatory Visit: Payer: Self-pay | Admitting: Gynecology

## 2014-06-01 ENCOUNTER — Other Ambulatory Visit: Payer: BLUE CROSS/BLUE SHIELD

## 2014-06-01 DIAGNOSIS — Z1231 Encounter for screening mammogram for malignant neoplasm of breast: Secondary | ICD-10-CM

## 2014-06-01 DIAGNOSIS — R928 Other abnormal and inconclusive findings on diagnostic imaging of breast: Secondary | ICD-10-CM

## 2014-06-01 DIAGNOSIS — R319 Hematuria, unspecified: Secondary | ICD-10-CM

## 2014-06-02 ENCOUNTER — Ambulatory Visit
Admission: RE | Admit: 2014-06-02 | Discharge: 2014-06-02 | Disposition: A | Discharge status: Home / Self Care | Payer: BLUE CROSS/BLUE SHIELD | Source: Ambulatory Visit | Attending: Gynecology | Admitting: Gynecology

## 2014-06-02 DIAGNOSIS — R928 Other abnormal and inconclusive findings on diagnostic imaging of breast: Secondary | ICD-10-CM

## 2014-06-02 LAB — URINALYSIS W MICROSCOPIC + REFLEX CULTURE
Bacteria, UA: NONE SEEN
Bilirubin Urine: NEGATIVE
CASTS: NONE SEEN
CRYSTALS: NONE SEEN
Glucose, UA: NEGATIVE mg/dL
KETONES UR: NEGATIVE mg/dL
Leukocytes, UA: NEGATIVE
Nitrite: NEGATIVE
Protein, ur: NEGATIVE mg/dL
SPECIFIC GRAVITY, URINE: 1.025 (ref 1.005–1.030)
Squamous Epithelial / LPF: NONE SEEN
UROBILINOGEN UA: 0.2 mg/dL (ref 0.0–1.0)
pH: 6 (ref 5.0–8.0)

## 2014-06-03 LAB — URINE CULTURE
COLONY COUNT: NO GROWTH
ORGANISM ID, BACTERIA: NO GROWTH

## 2014-06-07 ENCOUNTER — Encounter: Payer: Self-pay | Admitting: Gynecology

## 2014-06-16 ENCOUNTER — Encounter: Payer: Self-pay | Admitting: Gynecology

## 2015-04-02 ENCOUNTER — Encounter: Payer: Self-pay | Admitting: Gynecology

## 2015-04-02 ENCOUNTER — Other Ambulatory Visit: Payer: Self-pay

## 2015-04-02 DIAGNOSIS — Z1231 Encounter for screening mammogram for malignant neoplasm of breast: Secondary | ICD-10-CM

## 2015-05-09 DIAGNOSIS — IMO0002 Reserved for concepts with insufficient information to code with codable children: Secondary | ICD-10-CM

## 2015-05-09 HISTORY — DX: Reserved for concepts with insufficient information to code with codable children: IMO0002

## 2015-05-25 ENCOUNTER — Encounter: Payer: BLUE CROSS/BLUE SHIELD | Admitting: Gynecology

## 2015-05-28 ENCOUNTER — Encounter: Payer: Self-pay | Admitting: Gynecology

## 2015-06-01 ENCOUNTER — Encounter: Payer: Self-pay | Admitting: Gynecology

## 2015-06-01 ENCOUNTER — Ambulatory Visit (INDEPENDENT_AMBULATORY_CARE_PROVIDER_SITE_OTHER): Payer: BLUE CROSS/BLUE SHIELD | Admitting: Gynecology

## 2015-06-01 VITALS — BP 122/76 | Ht 70.0 in | Wt 188.0 lb

## 2015-06-01 DIAGNOSIS — Z9189 Other specified personal risk factors, not elsewhere classified: Secondary | ICD-10-CM

## 2015-06-01 DIAGNOSIS — N952 Postmenopausal atrophic vaginitis: Secondary | ICD-10-CM | POA: Diagnosis not present

## 2015-06-01 DIAGNOSIS — Z01419 Encounter for gynecological examination (general) (routine) without abnormal findings: Secondary | ICD-10-CM | POA: Diagnosis not present

## 2015-06-01 DIAGNOSIS — Z79890 Hormone replacement therapy (postmenopausal): Secondary | ICD-10-CM

## 2015-06-01 MED ORDER — ZOLPIDEM TARTRATE 5 MG PO TABS
5.0000 mg | ORAL_TABLET | Freq: Every evening | ORAL | Status: DC | PRN
Start: 2015-06-01 — End: 2015-12-10

## 2015-06-01 MED ORDER — ESTRADIOL 1 MG PO TABS: 1.0000 mg | ORAL_TABLET | Freq: Every day | ORAL | Status: DC

## 2015-06-01 MED ORDER — EPINEPHRINE 0.3 MG/0.3ML IJ SOAJ: 0.3000 mg | Freq: Once | INTRAMUSCULAR | Status: DC

## 2015-06-01 NOTE — Patient Instructions (Signed)

## 2015-06-01 NOTE — Addendum Note (Signed)
Addended by: Nelva Nay on: 06/01/2015 04:30 PM   Modules accepted: Orders

## 2015-06-01 NOTE — Progress Notes (Signed)
    Pamela Whitehead [birth year] [id number]        64 y.o.  G1P0010  for annual exam.  Doing well without complaints.  Past medical history,surgical history, problem list, medications, allergies, family history and social history were all reviewed and documented as reviewed in the EPIC chart.  ROS:  Performed with pertinent positives and negatives included in the history, assessment and plan.   Additional significant findings :  none   Exam: [name] assistant Filed Vitals:   06/01/15 1600  BP: 122/76  Height: 5\' 10"  (1.778 m)  Weight: 188 lb (85.276 kg)   General appearance:  Normal affect, orientation and appearance. Skin: Grossly normal HEENT: Without gross lesions.  No cervical or supraclavicular adenopathy. Thyroid normal.  Lungs:  Clear without wheezing, rales or rhonchi Cardiac: RR, without RMG Abdominal:  Soft, nontender, without masses, guarding, rebound, organomegaly or hernia Breasts:  Examined lying and sitting without masses, retractions, discharge or axillary adenopathy. Pelvic:  Ext/BUS/vagina with atrophic changes Pap smear of cuff done  Adnexa without masses or tenderness    Anus and perineum normal   Rectovaginal normal sphincter tone without palpated masses or tenderness.    Assessment/Plan:  65 y.o. G66P0010 female for annual exam.   1. Postmenopausal/atrophic genital changes.  Status post [facility] 1993. On Estrace 1 mg daily. Was to reduce to 0.5 last year but never did this. I again reviewed all issue of HRT and risks to include stroke heart attack DVT and breast cancer. Patient wants to continue and I again asked her to try 0.5 mg daily and if she tolerates this to stay on it. If not then to continue on the 1 mg. Refill 1 year was provided. 2. DES exposure. Patient alerted [place] today that her mother took DES during her pregnancy. Recommend annual cytology. Pap smear done today. Does have history of ASCUS 2 with negative HPV 2013. VAIN 1 2000. 3. Mammography  scheduled she will follow up for this. SBE monthly reviewed. 4. DEXA 2014 normal. Repeat at 5 year interval. 5. Coloscopy 2014. Repeat at their recommended interval. 6. Ambien 5 mg #30 with 4 refills provided for occasional insomnia. Does well with this without side effects. 7. EpiPen refill provided. 8. Health maintenance. No routine lab work done as patient reports this done at her primary physician's office. Follow up 1 year, sooner as needed.   [name] MD, 4:25 PM 06/01/2015

## 2015-06-04 ENCOUNTER — Ambulatory Visit
Admission: RE | Admit: 2015-06-04 | Discharge: 2015-06-04 | Disposition: A | Discharge status: Home / Self Care | Payer: BLUE CROSS/BLUE SHIELD | Source: Ambulatory Visit

## 2015-06-04 DIAGNOSIS — Z1231 Encounter for screening mammogram for malignant neoplasm of breast: Secondary | ICD-10-CM

## 2015-06-04 LAB — PAP IG W/ RFLX HPV ASCU

## 2015-06-05 ENCOUNTER — Encounter: Payer: Self-pay | Admitting: Gynecology

## 2015-06-05 LAB — HUMAN PAPILLOMAVIRUS, HIGH RISK: HPV DNA High Risk: DETECTED — AB

## 2015-06-06 ENCOUNTER — Other Ambulatory Visit: Payer: Self-pay | Admitting: Gynecology

## 2015-06-06 DIAGNOSIS — R928 Other abnormal and inconclusive findings on diagnostic imaging of breast: Secondary | ICD-10-CM

## 2015-06-07 ENCOUNTER — Emergency Department (HOSPITAL_COMMUNITY)
Admission: EM | Admit: 2015-06-07 | Discharge: 2015-06-07 | Disposition: A | Discharge status: Home / Self Care | Payer: BLUE CROSS/BLUE SHIELD | Attending: Emergency Medicine | Admitting: Emergency Medicine

## 2015-06-07 ENCOUNTER — Encounter (HOSPITAL_COMMUNITY): Payer: Self-pay | Admitting: *Deleted

## 2015-06-07 DIAGNOSIS — R42 Dizziness and giddiness: Secondary | ICD-10-CM | POA: Diagnosis not present

## 2015-06-07 DIAGNOSIS — I10 Essential (primary) hypertension: Secondary | ICD-10-CM | POA: Insufficient documentation

## 2015-06-07 DIAGNOSIS — R079 Chest pain, unspecified: Secondary | ICD-10-CM | POA: Diagnosis not present

## 2015-06-07 DIAGNOSIS — R000 Tachycardia, unspecified: Secondary | ICD-10-CM | POA: Insufficient documentation

## 2015-06-07 NOTE — ED Notes (Signed)
Pt c/o chest pain, dizziness since last night. Felt heart racing last night, tried vagal manuevers which usually break the atrial tachycardia which she has been diagnosed with before. Also took a small dose of xanax, it finally broke but she feels lightheaded still, with chest pain. Has had cardiology work up, with [facility] but nothing captured. Just feels worse than she usually does after these episodes. No c/o sob, diaphoresis, current palpitations.

## 2015-06-07 NOTE — ED Notes (Signed)
Second call for blood draw with no answer.. 

## 2015-06-07 NOTE — ED Notes (Signed)
Pt called for room no response from lobby x2

## 2015-06-07 NOTE — ED Notes (Signed)
No answer for blood draw at this time. 

## 2015-06-11 ENCOUNTER — Ambulatory Visit
Admission: RE | Admit: 2015-06-11 | Discharge: 2015-06-11 | Disposition: A | Discharge status: Home / Self Care | Payer: BLUE CROSS/BLUE SHIELD | Source: Ambulatory Visit | Attending: Gynecology | Admitting: Gynecology

## 2015-06-11 DIAGNOSIS — R928 Other abnormal and inconclusive findings on diagnostic imaging of breast: Secondary | ICD-10-CM

## 2015-06-19 ENCOUNTER — Ambulatory Visit (INDEPENDENT_AMBULATORY_CARE_PROVIDER_SITE_OTHER): Payer: BLUE CROSS/BLUE SHIELD | Admitting: Gynecology

## 2015-06-19 ENCOUNTER — Encounter: Payer: Self-pay | Admitting: Gynecology

## 2015-06-19 VITALS — BP 122/76

## 2015-06-19 DIAGNOSIS — R896 Abnormal cytological findings in specimens from other organs, systems and tissues: Secondary | ICD-10-CM | POA: Diagnosis not present

## 2015-06-19 DIAGNOSIS — IMO0002 Reserved for concepts with insufficient information to code with codable children: Secondary | ICD-10-CM

## 2015-06-19 MED ORDER — FLUCONAZOLE 150 MG PO TABS: 150.0000 mg | ORAL_TABLET | Freq: Once | ORAL | Status: DC

## 2015-06-19 NOTE — Progress Notes (Signed)
    Pamela Whitehead 06/05/1950 [id number]        65 y.o.  G1P0010 presents for colposcopy.  Most recent Pap smear showed ASCUS with positive high-risk HPV. She also has a history of VAIN 1 in 2000. History of ASCUS 2 with negative HPV in 2013. No recent new sexual exposures reported for years.  Past medical history,surgical history, problem list, medications, allergies, family history and social history were all reviewed and documented in the EPIC chart.  Directed ROS with pertinent positives and negatives documented in the history of present illness/assessment and plan.  Exam: [name] assistant Filed Vitals:   06/19/15 1602  BP: 122/76   General appearance:  Normal Pelvic external BUS vagina with atrophic changes. No gross visual or palpable abnormalities on bimanual.  Colposcopy after acetic acid cleanse is normal.  Assessment/Plan:  65 y.o. G1P0010 with history as above. Colposcopy was negative. Recommend follow up Pap smear in 6 months. Patient agrees to schedule an follow up for this.    [name] MD, 4:33 PM 06/19/2015

## 2015-06-19 NOTE — Patient Instructions (Signed)
Follow up for Pap smear in 6 months

## 2015-07-17 DIAGNOSIS — R002 Palpitations: Secondary | ICD-10-CM | POA: Diagnosis not present

## 2015-07-17 DIAGNOSIS — I471 Supraventricular tachycardia: Secondary | ICD-10-CM | POA: Diagnosis not present

## 2015-07-17 DIAGNOSIS — I10 Essential (primary) hypertension: Secondary | ICD-10-CM | POA: Diagnosis not present

## 2015-08-08 DIAGNOSIS — I10 Essential (primary) hypertension: Secondary | ICD-10-CM | POA: Diagnosis not present

## 2015-08-08 DIAGNOSIS — R0789 Other chest pain: Secondary | ICD-10-CM | POA: Diagnosis not present

## 2015-09-19 DIAGNOSIS — S9001XA Contusion of right ankle, initial encounter: Secondary | ICD-10-CM | POA: Diagnosis not present

## 2015-09-19 DIAGNOSIS — S8001XA Contusion of right knee, initial encounter: Secondary | ICD-10-CM | POA: Diagnosis not present

## 2015-09-24 DIAGNOSIS — L308 Other specified dermatitis: Secondary | ICD-10-CM | POA: Diagnosis not present

## 2015-09-24 DIAGNOSIS — L570 Actinic keratosis: Secondary | ICD-10-CM | POA: Diagnosis not present

## 2015-09-24 DIAGNOSIS — I780 Hereditary hemorrhagic telangiectasia: Secondary | ICD-10-CM | POA: Diagnosis not present

## 2015-11-21 DIAGNOSIS — R002 Palpitations: Secondary | ICD-10-CM | POA: Diagnosis not present

## 2015-11-21 DIAGNOSIS — I10 Essential (primary) hypertension: Secondary | ICD-10-CM | POA: Diagnosis not present

## 2015-12-09 DIAGNOSIS — R87620 Atypical squamous cells of undetermined significance on cytologic smear of vagina (ASC-US): Secondary | ICD-10-CM

## 2015-12-09 HISTORY — DX: Atypical squamous cells of undetermined significance on cytologic smear of vagina (ASC-US): R87.620

## 2015-12-10 ENCOUNTER — Telehealth: Payer: Self-pay | Admitting: *Deleted

## 2015-12-10 MED ORDER — ZOLPIDEM TARTRATE 5 MG PO TABS: 5.0000 mg | ORAL_TABLET | Freq: Every evening | ORAL | 4 refills | Status: DC | PRN

## 2015-12-10 NOTE — Telephone Encounter (Signed)
Pt called requesting refill on Ambien 10 mg, last filled in march with 4 refills.

## 2015-12-10 NOTE — Telephone Encounter (Signed)
Rx called in pt aware.

## 2015-12-10 NOTE — Telephone Encounter (Signed)
It looks like she is taking Ambien 5 mg not 10 mg. #30 with 4 refills okay for the 5 mg

## 2015-12-20 ENCOUNTER — Ambulatory Visit: Payer: BLUE CROSS/BLUE SHIELD | Admitting: Gynecology

## 2015-12-26 ENCOUNTER — Ambulatory Visit (INDEPENDENT_AMBULATORY_CARE_PROVIDER_SITE_OTHER): Payer: Medicare Other | Admitting: Gynecology

## 2015-12-26 ENCOUNTER — Encounter: Payer: Self-pay | Admitting: Gynecology

## 2015-12-26 VITALS — BP 120/78

## 2015-12-26 DIAGNOSIS — R87620 Atypical squamous cells of undetermined significance on cytologic smear of vagina (ASC-US): Secondary | ICD-10-CM

## 2015-12-26 DIAGNOSIS — R87811 Vaginal high risk human papillomavirus (HPV) DNA test positive: Secondary | ICD-10-CM

## 2015-12-26 DIAGNOSIS — Z1151 Encounter for screening for human papillomavirus (HPV): Secondary | ICD-10-CM | POA: Diagnosis not present

## 2015-12-26 NOTE — Patient Instructions (Signed)
Follow up for Pap smear results Follow up for annual exam in 6 months

## 2015-12-26 NOTE — Addendum Note (Signed)
Addended by: Nelva Nay on: 12/26/2015 09:29 AM   Modules accepted: Orders

## 2015-12-26 NOTE — Progress Notes (Signed)
    Pamela Whitehead [birth year] [id number]        65 y.o.  G1P0010 presents for follow up Pap smear. 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 Follow up colposcopy was normal. 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 ASCUS 2 with negative HPV in 2013. No recent new partners  Past medical history,surgical history, problem list, medications, allergies, family history and social history were all reviewed and documented in the EPIC chart.  Directed ROS with pertinent positives and negatives documented in the history of present illness/assessment and plan.  Exam: [name] assistant Vitals:   12/26/15 0908  BP: 120/78   General appearance:  Normal External BUS vagina normal. Pap smear of cuff done. Bimanual exam without masses or tenderness. Vaginal mucosa palpates normal.  Assessment/Plan:  65 y.o. G1P0010 with history as above. Pap smear done today. Patient will follow up for results. Assuming negative or low-grade and plan follow up in 6 months at her annual exam. If otherwise then will triage based upon results.    [name] MD, 9:16 AM 12/26/2015

## 2015-12-27 LAB — PAP IG W/ RFLX HPV ASCU

## 2015-12-28 ENCOUNTER — Encounter: Payer: Self-pay | Admitting: Gynecology

## 2015-12-28 LAB — HUMAN PAPILLOMAVIRUS, HIGH RISK: HPV DNA HIGH RISK: NOT DETECTED

## 2016-02-26 DIAGNOSIS — R300 Dysuria: Secondary | ICD-10-CM | POA: Diagnosis not present

## 2016-02-26 DIAGNOSIS — Z23 Encounter for immunization: Secondary | ICD-10-CM | POA: Diagnosis not present

## 2016-02-26 DIAGNOSIS — R5381 Other malaise: Secondary | ICD-10-CM | POA: Diagnosis not present

## 2016-02-26 DIAGNOSIS — I10 Essential (primary) hypertension: Secondary | ICD-10-CM | POA: Diagnosis not present

## 2016-03-31 DIAGNOSIS — J329 Chronic sinusitis, unspecified: Secondary | ICD-10-CM | POA: Diagnosis not present

## 2016-03-31 DIAGNOSIS — R05 Cough: Secondary | ICD-10-CM | POA: Diagnosis not present

## 2016-04-04 DIAGNOSIS — J0140 Acute pansinusitis, unspecified: Secondary | ICD-10-CM | POA: Diagnosis not present

## 2016-04-04 DIAGNOSIS — J029 Acute pharyngitis, unspecified: Secondary | ICD-10-CM | POA: Diagnosis not present

## 2016-05-01 ENCOUNTER — Other Ambulatory Visit: Payer: Self-pay | Admitting: Gynecology

## 2016-05-01 DIAGNOSIS — Z1231 Encounter for screening mammogram for malignant neoplasm of breast: Secondary | ICD-10-CM

## 2016-05-22 DIAGNOSIS — M25572 Pain in left ankle and joints of left foot: Secondary | ICD-10-CM | POA: Diagnosis not present

## 2016-05-22 DIAGNOSIS — M25562 Pain in left knee: Secondary | ICD-10-CM | POA: Diagnosis not present

## 2016-05-22 DIAGNOSIS — G8929 Other chronic pain: Secondary | ICD-10-CM | POA: Diagnosis not present

## 2016-05-22 DIAGNOSIS — M545 Low back pain: Secondary | ICD-10-CM | POA: Diagnosis not present

## 2016-06-04 ENCOUNTER — Encounter: Payer: Self-pay | Admitting: Gynecology

## 2016-06-04 ENCOUNTER — Ambulatory Visit (INDEPENDENT_AMBULATORY_CARE_PROVIDER_SITE_OTHER): Payer: Medicare Other | Admitting: Gynecology

## 2016-06-04 VITALS — BP 132/80 | Ht 70.0 in | Wt 177.0 lb

## 2016-06-04 DIAGNOSIS — R87620 Atypical squamous cells of undetermined significance on cytologic smear of vagina (ASC-US): Secondary | ICD-10-CM | POA: Diagnosis not present

## 2016-06-04 DIAGNOSIS — Z01411 Encounter for gynecological examination (general) (routine) with abnormal findings: Secondary | ICD-10-CM

## 2016-06-04 DIAGNOSIS — Z9189 Other specified personal risk factors, not elsewhere classified: Secondary | ICD-10-CM | POA: Diagnosis not present

## 2016-06-04 DIAGNOSIS — N952 Postmenopausal atrophic vaginitis: Secondary | ICD-10-CM

## 2016-06-04 DIAGNOSIS — R87811 Vaginal high risk human papillomavirus (HPV) DNA test positive: Secondary | ICD-10-CM

## 2016-06-04 DIAGNOSIS — Z79890 Hormone replacement therapy (postmenopausal): Secondary | ICD-10-CM | POA: Diagnosis not present

## 2016-06-04 DIAGNOSIS — Z1151 Encounter for screening for human papillomavirus (HPV): Secondary | ICD-10-CM | POA: Diagnosis not present

## 2016-06-04 MED ORDER — ZOLPIDEM TARTRATE 5 MG PO TABS: 5.0000 mg | ORAL_TABLET | Freq: Every evening | ORAL | 4 refills | Status: DC | PRN

## 2016-06-04 MED ORDER — EPINEPHRINE 0.3 MG/0.3ML IJ SOAJ: 0.3000 mg | Freq: Once | INTRAMUSCULAR | 2 refills | Status: AC

## 2016-06-04 MED ORDER — ESTRADIOL 1 MG PO TABS: 1.0000 mg | ORAL_TABLET | Freq: Every day | ORAL | 12 refills | Status: DC

## 2016-06-04 NOTE — Patient Instructions (Signed)

## 2016-06-04 NOTE — Progress Notes (Signed)
    Pamela Whitehead [birth year] [id number]        65 y.o.  G1P0010 for breast and pelvic exam. Patient also for follow up of her ASCUS with positive high-risk HPV. She tried weaning from her HRT last year but had acceptable headaches and some hot flushes. She decided to continue on 1 mg daily. Has done well since increasing her dose back to this.  Past medical history,surgical history, problem list, medications, allergies, family history and social history were all reviewed and documented as reviewed in the EPIC chart.  ROS:  Performed with pertinent positives and negatives included in the history, assessment and plan.   Additional significant findings :  None   Exam: Copywriter, advertising Vitals:   06/04/16 0912  BP: 132/80  Weight: 177 lb (80.3 kg)  Height: 5\' 10"  (1.778 m)   Body mass index is 25.4 kg/m.  General appearance:  Normal affect, orientation and appearance. Skin: Grossly normal HEENT: Without gross lesions.  No cervical or supraclavicular adenopathy. Thyroid normal.  Lungs:  Clear without wheezing, rales or rhonchi Cardiac: RR, without RMG Abdominal:  Soft, nontender, without masses, guarding, rebound, organomegaly or hernia Breasts:  Examined lying and sitting without masses, retractions, discharge or axillary adenopathy. Pelvic:  Ext, BUS, Vagina: With atrophic changes. Pap smear/HPV of vaginal cuff done  Adnexa: Without masses or tenderness    Anus and perineum: Normal   Rectovaginal: Normal sphincter tone without palpated masses or tenderness.    Assessment/Plan:  66 y.o. G69P0010 female for breast and pelvic exam.   1. 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 ASCUS negative high-risk HPV 2 2013. Follow up colposcopy was normal. Follow up Pap smear October 2017 showed ASCUS. Pap smear/HPV done today. Follow up for results and will triage based upon results. 2. HRT. Has been estradiol 1 mg for menopausal symptoms. Tried weaning last year with headaches and  occasional hot flushes. Increased back to 1 mg. Doing well with this. Wants to continue. Reviewed current up-to-date data to include 2017 NAMS guidelines. Benefits as far as symptom relief possible cardiovascular/bone health versus risks to include thrombosis such as stroke heart attack DVT and breast cancer reviewed. Refill 1 year provided. 3. 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 Continue with annual Pap smears. 4. DEXA 2014 normal. Repeat next year at 5 year interval. 5. Mammography scheduled tomorrow. SBE monthly reviewed. 6. Colonoscopy 2014. Repeat at their recommended interval. 7. Ambien 5 mg #30 with 4 refills provided for insomnia. She does well with this and does not use it consistently. 8. EpiPen refill provided at her request. 9. Health maintenance. No routine lab work done as patient does this elsewhere. Follow up in one year, sooner as needed.  Additional time in excess of her breast and pelvic exam was spent in direct face to face counseling and coordination of care in regards to her ASCUS history and HRT review of her symptoms, risks versus benefits.    [name] MD, 9:46 AM 06/04/2016

## 2016-06-05 ENCOUNTER — Ambulatory Visit
Admission: RE | Admit: 2016-06-05 | Discharge: 2016-06-05 | Disposition: A | Discharge status: Home / Self Care | Payer: Medicare Other | Source: Ambulatory Visit | Attending: Gynecology | Admitting: Gynecology

## 2016-06-05 DIAGNOSIS — Z1231 Encounter for screening mammogram for malignant neoplasm of breast: Secondary | ICD-10-CM

## 2016-06-05 DIAGNOSIS — D225 Melanocytic nevi of trunk: Secondary | ICD-10-CM | POA: Diagnosis not present

## 2016-06-05 DIAGNOSIS — L814 Other melanin hyperpigmentation: Secondary | ICD-10-CM | POA: Diagnosis not present

## 2016-06-05 DIAGNOSIS — Z85828 Personal history of other malignant neoplasm of skin: Secondary | ICD-10-CM | POA: Diagnosis not present

## 2016-06-05 DIAGNOSIS — L821 Other seborrheic keratosis: Secondary | ICD-10-CM | POA: Diagnosis not present

## 2016-06-05 DIAGNOSIS — L820 Inflamed seborrheic keratosis: Secondary | ICD-10-CM | POA: Diagnosis not present

## 2016-06-06 LAB — PAP, TP IMAGING W/ HPV RNA, RFLX HPV TYPE 16,18/45: HPV MRNA, HIGH RISK: NOT DETECTED

## 2016-06-20 DIAGNOSIS — I10 Essential (primary) hypertension: Secondary | ICD-10-CM | POA: Diagnosis not present

## 2016-06-20 DIAGNOSIS — I471 Supraventricular tachycardia: Secondary | ICD-10-CM | POA: Diagnosis not present

## 2016-10-15 DIAGNOSIS — E559 Vitamin D deficiency, unspecified: Secondary | ICD-10-CM | POA: Diagnosis not present

## 2016-10-15 DIAGNOSIS — R5383 Other fatigue: Secondary | ICD-10-CM | POA: Diagnosis not present

## 2016-12-04 ENCOUNTER — Other Ambulatory Visit: Payer: Self-pay | Admitting: Gynecology

## 2016-12-04 ENCOUNTER — Telehealth: Payer: Self-pay | Admitting: *Deleted

## 2016-12-04 NOTE — Telephone Encounter (Signed)
Number 30 with 4 refills OK

## 2016-12-04 NOTE — Telephone Encounter (Signed)
error 

## 2016-12-04 NOTE — Telephone Encounter (Signed)
Rx called in to pharmacy. 

## 2016-12-05 ENCOUNTER — Telehealth: Payer: Self-pay | Admitting: *Deleted

## 2016-12-05 NOTE — Telephone Encounter (Signed)
PA done online via cover my meds for Zolpidem 5 mg tablets, medication approved Effective from 12/05/2016 through 12/05/2017.

## 2016-12-11 DIAGNOSIS — R42 Dizziness and giddiness: Secondary | ICD-10-CM | POA: Diagnosis not present

## 2016-12-12 DIAGNOSIS — R42 Dizziness and giddiness: Secondary | ICD-10-CM | POA: Diagnosis not present

## 2016-12-12 DIAGNOSIS — Z23 Encounter for immunization: Secondary | ICD-10-CM | POA: Diagnosis not present

## 2017-01-14 DIAGNOSIS — H9193 Unspecified hearing loss, bilateral: Secondary | ICD-10-CM | POA: Diagnosis not present

## 2017-01-14 DIAGNOSIS — R42 Dizziness and giddiness: Secondary | ICD-10-CM | POA: Diagnosis not present

## 2017-02-18 DIAGNOSIS — I10 Essential (primary) hypertension: Secondary | ICD-10-CM | POA: Diagnosis not present

## 2017-02-18 DIAGNOSIS — Z23 Encounter for immunization: Secondary | ICD-10-CM | POA: Diagnosis not present

## 2017-03-31 DIAGNOSIS — K219 Gastro-esophageal reflux disease without esophagitis: Secondary | ICD-10-CM | POA: Diagnosis not present

## 2017-03-31 DIAGNOSIS — D225 Melanocytic nevi of trunk: Secondary | ICD-10-CM | POA: Diagnosis not present

## 2017-03-31 DIAGNOSIS — J329 Chronic sinusitis, unspecified: Secondary | ICD-10-CM | POA: Diagnosis not present

## 2017-03-31 DIAGNOSIS — L821 Other seborrheic keratosis: Secondary | ICD-10-CM | POA: Diagnosis not present

## 2017-03-31 DIAGNOSIS — L820 Inflamed seborrheic keratosis: Secondary | ICD-10-CM | POA: Diagnosis not present

## 2017-03-31 DIAGNOSIS — I10 Essential (primary) hypertension: Secondary | ICD-10-CM | POA: Diagnosis not present

## 2017-03-31 DIAGNOSIS — D1801 Hemangioma of skin and subcutaneous tissue: Secondary | ICD-10-CM | POA: Diagnosis not present

## 2017-04-12 DIAGNOSIS — M25512 Pain in left shoulder: Secondary | ICD-10-CM | POA: Diagnosis not present

## 2017-04-23 ENCOUNTER — Other Ambulatory Visit: Payer: Self-pay | Admitting: Gynecology

## 2017-04-23 DIAGNOSIS — Z1231 Encounter for screening mammogram for malignant neoplasm of breast: Secondary | ICD-10-CM

## 2017-06-01 ENCOUNTER — Encounter: Payer: Self-pay | Admitting: Gynecology

## 2017-06-01 NOTE — Telephone Encounter (Signed)
Can you update patient chart. Thanks

## 2017-06-05 ENCOUNTER — Ambulatory Visit (INDEPENDENT_AMBULATORY_CARE_PROVIDER_SITE_OTHER): Payer: Medicare Other | Admitting: Gynecology

## 2017-06-05 ENCOUNTER — Encounter: Payer: Self-pay | Admitting: Gynecology

## 2017-06-05 VITALS — BP 120/72 | Ht 70.0 in | Wt 184.0 lb

## 2017-06-05 DIAGNOSIS — N952 Postmenopausal atrophic vaginitis: Secondary | ICD-10-CM

## 2017-06-05 DIAGNOSIS — Z9189 Other specified personal risk factors, not elsewhere classified: Secondary | ICD-10-CM | POA: Diagnosis not present

## 2017-06-05 DIAGNOSIS — R87610 Atypical squamous cells of undetermined significance on cytologic smear of cervix (ASC-US): Secondary | ICD-10-CM

## 2017-06-05 DIAGNOSIS — Z1272 Encounter for screening for malignant neoplasm of vagina: Secondary | ICD-10-CM

## 2017-06-05 DIAGNOSIS — R87620 Atypical squamous cells of undetermined significance on cytologic smear of vagina (ASC-US): Secondary | ICD-10-CM | POA: Diagnosis not present

## 2017-06-05 DIAGNOSIS — Z79890 Hormone replacement therapy (postmenopausal): Secondary | ICD-10-CM

## 2017-06-05 DIAGNOSIS — Z01411 Encounter for gynecological examination (general) (routine) with abnormal findings: Secondary | ICD-10-CM

## 2017-06-05 MED ORDER — ZOLPIDEM TARTRATE 5 MG PO TABS: 5.0000 mg | ORAL_TABLET | Freq: Every evening | ORAL | 4 refills | Status: DC | PRN

## 2017-06-05 MED ORDER — ESTRADIOL 1 MG PO TABS: 1.0000 mg | ORAL_TABLET | Freq: Every day | ORAL | 12 refills | Status: DC

## 2017-06-05 MED ORDER — EPINEPHRINE 0.3 MG/0.3ML IJ SOAJ: 0.3000 mg | Freq: Once | INTRAMUSCULAR | 4 refills | Status: AC

## 2017-06-05 NOTE — Patient Instructions (Addendum)
Follow-up for bone density as scheduled.  Follow-up in 1 year for annual exam. 

## 2017-06-05 NOTE — Progress Notes (Signed)
    Pamela Whitehead [birth year] [id number]        67 y.o.  G1P0010 for breast and pelvic exam.  Doing well without gynecologic complaints  Past medical history,surgical history, problem list, medications, allergies, family history and social history were all reviewed and documented as reviewed in the EPIC chart.  ROS:  Performed with pertinent positives and negatives included in the history, assessment and plan.   Additional significant findings : None   Exam: [name] assistant Vitals:   06/05/17 0841  BP: 120/72  Weight: 184 lb (83.5 kg)  Height: 5\' 10"  (1.778 m)   Body mass index is 26.4 kg/m.  General appearance:  Normal affect, orientation and appearance. Skin: Grossly normal HEENT: Without gross lesions.  No cervical or supraclavicular adenopathy. Thyroid normal.  Lungs:  Clear without wheezing, rales or rhonchi Cardiac: RR, without RMG Abdominal:  Soft, nontender, without masses, guarding, rebound, organomegaly or hernia Breasts:  Examined lying and sitting without masses, retractions, discharge or axillary adenopathy. Pelvic:  Ext, BUS, Vagina: With atrophic changes.  Pap smear done  Adnexa: Without masses or tenderness    Anus and perineum: Normal   Rectovaginal: Normal sphincter tone without palpated masses or tenderness.    Assessment/Plan:  67 y.o. G1P0010 female for breast and pelvic exam.  Status post hysterectomy in the past  1. Postmenopausal/atrophic genital changes/HRT.  Continues on estradiol 1 mg daily.  Has tried decreasing her dose but had unacceptable hot flushes and sweats.  We again reviewed the risks and benefits of HRT to include thrombosis such as stroke heart attack DVT and breast cancer issue.  Benefits as far as symptom relief and possible cardiovascular and bone health.  At this point the patient wants to continue and I refilled her times 1 year. 2. 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  History of ASCUS positive high risk HPV 2017.  Follow-up Pap  smears 2017 and 18 showed ASCUS but negative high risk HPV.  Pap smear done today.  Will continue with annual cytology. 3. Insomnia.  Uses Ambien 5 mg intermittently.  #30 with 4 refills provided.  Patient without side effects and doing well with this.  Has done so for years. 4. EpiPen refill provided per her request. 5. Mammography due and patient will follow-up for this.  Breast exam normal today. 6. Colonoscopy 2014.  Repeat at their recommended interval. 7. DEXA 5 years ago normal.  Recommend repeat DEXA now at age 67 and at 5-year interval.  Patient will schedule in follow-up for this. 8. Health maintenance.  No routine lab work done as patient does this elsewhere.  Follow-up 1 year, sooner as needed.   [name] MD, 9:21 AM 06/05/2017

## 2017-06-05 NOTE — Addendum Note (Signed)
Addended by: Nelva Nay on: 06/05/2017 09:46 AM   Modules accepted: Orders

## 2017-06-09 ENCOUNTER — Encounter: Payer: Self-pay | Admitting: Gynecology

## 2017-06-09 LAB — PAP IG W/ RFLX HPV ASCU

## 2017-06-09 LAB — HUMAN PAPILLOMAVIRUS, HIGH RISK: HPV DNA HIGH RISK: NOT DETECTED

## 2017-06-12 ENCOUNTER — Ambulatory Visit
Admission: RE | Admit: 2017-06-12 | Discharge: 2017-06-12 | Disposition: A | Discharge status: Home / Self Care | Payer: Medicare Other | Source: Ambulatory Visit | Attending: Gynecology | Admitting: Gynecology

## 2017-06-12 DIAGNOSIS — Z1231 Encounter for screening mammogram for malignant neoplasm of breast: Secondary | ICD-10-CM | POA: Diagnosis not present

## 2017-06-15 ENCOUNTER — Other Ambulatory Visit: Payer: Self-pay | Admitting: Gynecology

## 2017-06-15 DIAGNOSIS — R928 Other abnormal and inconclusive findings on diagnostic imaging of breast: Secondary | ICD-10-CM

## 2017-06-16 ENCOUNTER — Encounter: Payer: Self-pay | Admitting: Gynecology

## 2017-06-18 ENCOUNTER — Other Ambulatory Visit: Payer: Self-pay | Admitting: Gynecology

## 2017-06-18 ENCOUNTER — Ambulatory Visit
Admission: RE | Admit: 2017-06-18 | Discharge: 2017-06-18 | Disposition: A | Discharge status: Home / Self Care | Payer: Medicare Other | Source: Ambulatory Visit | Attending: Gynecology | Admitting: Gynecology

## 2017-06-18 DIAGNOSIS — R928 Other abnormal and inconclusive findings on diagnostic imaging of breast: Secondary | ICD-10-CM | POA: Diagnosis not present

## 2017-06-18 DIAGNOSIS — R922 Inconclusive mammogram: Secondary | ICD-10-CM | POA: Diagnosis not present

## 2017-06-18 DIAGNOSIS — N6324 Unspecified lump in the left breast, lower inner quadrant: Secondary | ICD-10-CM | POA: Diagnosis not present

## 2017-06-18 DIAGNOSIS — N6322 Unspecified lump in the left breast, upper inner quadrant: Secondary | ICD-10-CM | POA: Diagnosis not present

## 2017-06-18 DIAGNOSIS — N6320 Unspecified lump in the left breast, unspecified quadrant: Secondary | ICD-10-CM

## 2017-06-19 ENCOUNTER — Ambulatory Visit
Admission: RE | Admit: 2017-06-19 | Discharge: 2017-06-19 | Disposition: A | Discharge status: Home / Self Care | Payer: Medicare Other | Source: Ambulatory Visit | Attending: Gynecology | Admitting: Gynecology

## 2017-06-19 ENCOUNTER — Other Ambulatory Visit: Payer: Self-pay | Admitting: Gynecology

## 2017-06-19 DIAGNOSIS — N6012 Diffuse cystic mastopathy of left breast: Secondary | ICD-10-CM | POA: Diagnosis not present

## 2017-06-19 DIAGNOSIS — N6321 Unspecified lump in the left breast, upper outer quadrant: Secondary | ICD-10-CM | POA: Diagnosis not present

## 2017-06-19 DIAGNOSIS — N6320 Unspecified lump in the left breast, unspecified quadrant: Secondary | ICD-10-CM

## 2017-06-21 ENCOUNTER — Other Ambulatory Visit: Payer: Self-pay | Admitting: Gynecology

## 2017-06-24 ENCOUNTER — Ambulatory Visit (INDEPENDENT_AMBULATORY_CARE_PROVIDER_SITE_OTHER): Payer: Medicare Other

## 2017-06-24 ENCOUNTER — Other Ambulatory Visit: Payer: Self-pay | Admitting: Gynecology

## 2017-06-24 ENCOUNTER — Encounter: Payer: Self-pay | Admitting: Gynecology

## 2017-06-24 DIAGNOSIS — Z01411 Encounter for gynecological examination (general) (routine) with abnormal findings: Secondary | ICD-10-CM

## 2017-06-24 DIAGNOSIS — Z780 Asymptomatic menopausal state: Secondary | ICD-10-CM

## 2017-07-14 DIAGNOSIS — I10 Essential (primary) hypertension: Secondary | ICD-10-CM | POA: Diagnosis not present

## 2017-07-14 DIAGNOSIS — E782 Mixed hyperlipidemia: Secondary | ICD-10-CM | POA: Diagnosis not present

## 2017-08-27 ENCOUNTER — Telehealth: Payer: Self-pay | Admitting: *Deleted

## 2017-08-27 MED ORDER — FLUCONAZOLE 150 MG PO TABS: 150.0000 mg | ORAL_TABLET | Freq: Once | ORAL | 0 refills | Status: AC

## 2017-08-27 NOTE — Telephone Encounter (Signed)
Patient aware, Rx sent.  

## 2017-08-27 NOTE — Telephone Encounter (Signed)
Okay for Diflucan 150 mg x 1 dose 

## 2017-08-27 NOTE — Telephone Encounter (Signed)
Patient called c/o yeast infection vaginal itching asked if diflucan tablet can be sent to pharmacy? Please advise

## 2017-10-01 DIAGNOSIS — C44722 Squamous cell carcinoma of skin of right lower limb, including hip: Secondary | ICD-10-CM | POA: Diagnosis not present

## 2017-10-01 DIAGNOSIS — L570 Actinic keratosis: Secondary | ICD-10-CM | POA: Diagnosis not present

## 2017-10-01 DIAGNOSIS — D485 Neoplasm of uncertain behavior of skin: Secondary | ICD-10-CM | POA: Diagnosis not present

## 2017-10-21 DIAGNOSIS — C44722 Squamous cell carcinoma of skin of right lower limb, including hip: Secondary | ICD-10-CM | POA: Diagnosis not present

## 2017-11-29 ENCOUNTER — Other Ambulatory Visit: Payer: Self-pay | Admitting: Gynecology

## 2017-12-16 DIAGNOSIS — F419 Anxiety disorder, unspecified: Secondary | ICD-10-CM | POA: Diagnosis not present

## 2017-12-16 DIAGNOSIS — Z23 Encounter for immunization: Secondary | ICD-10-CM | POA: Diagnosis not present

## 2017-12-16 DIAGNOSIS — I10 Essential (primary) hypertension: Secondary | ICD-10-CM | POA: Diagnosis not present

## 2017-12-16 DIAGNOSIS — R1013 Epigastric pain: Secondary | ICD-10-CM | POA: Diagnosis not present

## 2017-12-16 DIAGNOSIS — I471 Supraventricular tachycardia: Secondary | ICD-10-CM | POA: Diagnosis not present

## 2018-01-09 DIAGNOSIS — Z23 Encounter for immunization: Secondary | ICD-10-CM | POA: Diagnosis not present

## 2018-03-26 DIAGNOSIS — D1801 Hemangioma of skin and subcutaneous tissue: Secondary | ICD-10-CM | POA: Diagnosis not present

## 2018-03-26 DIAGNOSIS — D225 Melanocytic nevi of trunk: Secondary | ICD-10-CM | POA: Diagnosis not present

## 2018-03-26 DIAGNOSIS — Z85828 Personal history of other malignant neoplasm of skin: Secondary | ICD-10-CM | POA: Diagnosis not present

## 2018-03-26 DIAGNOSIS — L570 Actinic keratosis: Secondary | ICD-10-CM | POA: Diagnosis not present

## 2018-03-26 DIAGNOSIS — L814 Other melanin hyperpigmentation: Secondary | ICD-10-CM | POA: Diagnosis not present

## 2018-03-26 DIAGNOSIS — L821 Other seborrheic keratosis: Secondary | ICD-10-CM | POA: Diagnosis not present

## 2018-03-26 DIAGNOSIS — L820 Inflamed seborrheic keratosis: Secondary | ICD-10-CM | POA: Diagnosis not present

## 2018-03-31 ENCOUNTER — Other Ambulatory Visit: Payer: Self-pay | Admitting: Gynecology

## 2018-03-31 DIAGNOSIS — Z1231 Encounter for screening mammogram for malignant neoplasm of breast: Secondary | ICD-10-CM

## 2018-06-12 ENCOUNTER — Other Ambulatory Visit: Payer: Self-pay | Admitting: Gynecology

## 2018-06-14 ENCOUNTER — Telehealth: Payer: Self-pay | Admitting: *Deleted

## 2018-06-14 MED ORDER — EPINEPHRINE 0.3 MG/0.3ML IJ SOAJ: 0.3000 mg | INTRAMUSCULAR | 0 refills | Status: AC | PRN

## 2018-06-14 MED ORDER — ESTRADIOL 1 MG PO TABS: ORAL_TABLET | ORAL | 1 refills | Status: DC

## 2018-06-14 MED ORDER — ZOLPIDEM TARTRATE 5 MG PO TABS: ORAL_TABLET | ORAL | 2 refills | Status: DC

## 2018-06-14 NOTE — Telephone Encounter (Signed)
Rx called in for Ambien 5 mg tablet, Rx sent for Epipen

## 2018-06-14 NOTE — Telephone Encounter (Signed)
Patient now has annual exam scheduled on 08/04/18, needs refill on Ambien 5 mg tablet and epipen states you refill this for her yearly. Please advise

## 2018-06-14 NOTE — Telephone Encounter (Signed)
Okay for Ambien 5 mg #30 with 2 refills and okay for EpiPen

## 2018-06-17 ENCOUNTER — Encounter: Payer: Medicare Other | Admitting: Gynecology

## 2018-06-17 ENCOUNTER — Ambulatory Visit: Payer: Medicare Other

## 2018-07-29 ENCOUNTER — Other Ambulatory Visit: Payer: Self-pay

## 2018-07-29 MED ORDER — ESTRADIOL 1 MG PO TABS: ORAL_TABLET | ORAL | 1 refills | Status: DC

## 2018-07-29 NOTE — Telephone Encounter (Signed)
CE is scheduled 09/03/18.

## 2018-08-04 ENCOUNTER — Encounter: Payer: Medicare Other | Admitting: Gynecology

## 2018-08-04 ENCOUNTER — Ambulatory Visit: Payer: Medicare Other

## 2018-09-01 ENCOUNTER — Other Ambulatory Visit: Payer: Self-pay

## 2018-09-02 ENCOUNTER — Ambulatory Visit (INDEPENDENT_AMBULATORY_CARE_PROVIDER_SITE_OTHER): Payer: Medicare Other | Admitting: Gynecology

## 2018-09-02 ENCOUNTER — Ambulatory Visit
Admission: RE | Admit: 2018-09-02 | Discharge: 2018-09-02 | Disposition: A | Discharge status: Home / Self Care | Payer: Medicare Other | Source: Ambulatory Visit | Attending: Gynecology | Admitting: Gynecology

## 2018-09-02 ENCOUNTER — Encounter: Payer: Self-pay | Admitting: Gynecology

## 2018-09-02 VITALS — BP 118/80 | Ht 70.0 in | Wt 176.0 lb

## 2018-09-02 DIAGNOSIS — Z1231 Encounter for screening mammogram for malignant neoplasm of breast: Secondary | ICD-10-CM

## 2018-09-02 DIAGNOSIS — F5101 Primary insomnia: Secondary | ICD-10-CM

## 2018-09-02 DIAGNOSIS — Z79890 Hormone replacement therapy (postmenopausal): Secondary | ICD-10-CM | POA: Diagnosis not present

## 2018-09-02 DIAGNOSIS — R87622 Low grade squamous intraepithelial lesion on cytologic smear of vagina (LGSIL): Secondary | ICD-10-CM | POA: Diagnosis not present

## 2018-09-02 DIAGNOSIS — Z1272 Encounter for screening for malignant neoplasm of vagina: Secondary | ICD-10-CM | POA: Diagnosis not present

## 2018-09-02 DIAGNOSIS — Z9189 Other specified personal risk factors, not elsewhere classified: Secondary | ICD-10-CM

## 2018-09-02 DIAGNOSIS — N952 Postmenopausal atrophic vaginitis: Secondary | ICD-10-CM

## 2018-09-02 DIAGNOSIS — Z01419 Encounter for gynecological examination (general) (routine) without abnormal findings: Secondary | ICD-10-CM | POA: Diagnosis not present

## 2018-09-02 MED ORDER — ESTRADIOL 1 MG PO TABS: ORAL_TABLET | ORAL | 4 refills | Status: DC

## 2018-09-02 MED ORDER — FLUCONAZOLE 150 MG PO TABS: 150.0000 mg | ORAL_TABLET | Freq: Once | ORAL | 0 refills | Status: AC

## 2018-09-02 MED ORDER — ZOLPIDEM TARTRATE 5 MG PO TABS: ORAL_TABLET | ORAL | 4 refills | Status: DC

## 2018-09-02 NOTE — Patient Instructions (Signed)
Follow-up in 1 year for annual exam 

## 2018-09-02 NOTE — Progress Notes (Signed)
    Pamela Whitehead 17-Sep-1950 [id number]        68 y.o.  G1P0010 for breast and pelvic exam.  Without gynecologic complaints  Past medical history,surgical history, problem list, medications, allergies, family history and social history were all reviewed and documented as reviewed in the EPIC chart.  ROS:  Performed with pertinent positives and negatives included in the history, assessment and plan.   Additional significant findings : None   Exam: [name] assistant Vitals:   09/02/18 0900  BP: 118/80  Weight: 176 lb (79.8 kg)  Height: 5\' 10"  (1.778 m)   Body mass index is 25.25 kg/m.  General appearance:  Normal affect, orientation and appearance. Skin: Grossly normal HEENT: Without gross lesions.  No cervical or supraclavicular adenopathy. Thyroid normal.  Lungs:  Clear without wheezing, rales or rhonchi Cardiac: RR, without RMG Abdominal:  Soft, nontender, without masses, guarding, rebound, organomegaly or hernia Breasts:  Examined lying and sitting without masses, retractions, discharge or axillary adenopathy. Pelvic:  Ext, BUS, Vagina: With atrophic changes.  Pap smear done  Adnexa: Without masses or tenderness    Anus and perineum: Normal   Rectovaginal: Normal sphincter tone without palpated masses or tenderness.    Assessment/Plan:  68 y.o. G1P0010 female for breast and pelvic exam  1. Postmenopausal/atrophic genital changes.  Uses estradiol 1 mg daily.  Has tried weaning in the past with unacceptable 's symptoms.  Would like to continue.  We again discussed the risks versus benefits.  Increased risk of thrombosis such as stroke heart attack DVT in the breast cancer issue reviewed.  Refill x1 year provided. 2. 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  History of ASCUS negative high risk HPV 2017, 2018, 2019.  Pap smear done today.  History of positive HPV with ASCUS 2017. 3. Insomnia.  Uses Ambien 5 mg intermittently.  #30 with 4 refills provided. 4. History of recurrent  yeast infections when exercising.  Diflucan 150 mg #5 to use PRN provided. 5. Mammography scheduled today.  Breast exam normal today. 6. DEXA 2019 normal.  Recommend repeat at 5-year interval. 7. Colonoscopy 2014.  Repeat at their recommended interval. 8. Health maintenance.  No routine lab work done as patient does this elsewhere.  Follow-up 1 year, sooner as needed.   [name] MD, 9:31 AM 09/02/2018

## 2018-09-02 NOTE — Addendum Note (Signed)
Addended by: Nelva Nay on: 09/02/2018 09:48 AM   Modules accepted: Orders

## 2018-09-03 LAB — PAP IG W/ RFLX HPV ASCU

## 2018-09-06 ENCOUNTER — Other Ambulatory Visit: Payer: Self-pay | Admitting: Gynecology

## 2018-09-06 ENCOUNTER — Encounter: Payer: Self-pay | Admitting: Gynecology

## 2018-09-06 DIAGNOSIS — R928 Other abnormal and inconclusive findings on diagnostic imaging of breast: Secondary | ICD-10-CM

## 2018-09-09 ENCOUNTER — Ambulatory Visit
Admission: RE | Admit: 2018-09-09 | Discharge: 2018-09-09 | Disposition: A | Discharge status: Home / Self Care | Payer: Medicare Other | Source: Ambulatory Visit | Attending: Gynecology | Admitting: Gynecology

## 2018-09-09 ENCOUNTER — Other Ambulatory Visit: Payer: Self-pay | Admitting: Gynecology

## 2018-09-09 ENCOUNTER — Other Ambulatory Visit: Payer: Self-pay

## 2018-09-09 DIAGNOSIS — R928 Other abnormal and inconclusive findings on diagnostic imaging of breast: Secondary | ICD-10-CM

## 2018-09-09 DIAGNOSIS — N6313 Unspecified lump in the right breast, lower outer quadrant: Secondary | ICD-10-CM | POA: Diagnosis not present

## 2018-09-09 DIAGNOSIS — N63 Unspecified lump in unspecified breast: Secondary | ICD-10-CM

## 2018-09-09 DIAGNOSIS — N6001 Solitary cyst of right breast: Secondary | ICD-10-CM | POA: Diagnosis not present

## 2018-09-09 DIAGNOSIS — R922 Inconclusive mammogram: Secondary | ICD-10-CM | POA: Diagnosis not present

## 2018-09-09 DIAGNOSIS — N6314 Unspecified lump in the right breast, lower inner quadrant: Secondary | ICD-10-CM | POA: Diagnosis not present

## 2018-09-23 ENCOUNTER — Other Ambulatory Visit: Payer: Self-pay

## 2018-09-24 ENCOUNTER — Ambulatory Visit (INDEPENDENT_AMBULATORY_CARE_PROVIDER_SITE_OTHER): Payer: Medicare Other | Admitting: Gynecology

## 2018-09-24 ENCOUNTER — Encounter: Payer: Self-pay | Admitting: Gynecology

## 2018-09-24 VITALS — BP 122/78

## 2018-09-24 DIAGNOSIS — Z9189 Other specified personal risk factors, not elsewhere classified: Secondary | ICD-10-CM

## 2018-09-24 DIAGNOSIS — R87612 Low grade squamous intraepithelial lesion on cytologic smear of cervix (LGSIL): Secondary | ICD-10-CM | POA: Diagnosis not present

## 2018-09-24 NOTE — Progress Notes (Signed)
    Ardyth Kelso Kessen Oct 02, 1950 [id number]        68 y.o.  G1P0010 presents for colposcopy.  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  History of positive HPV with ASCUS 2017. ASCUS negative high risk HPV 2018 & 2019.  Most recent Pap smear showed LGSIL   Past medical history,surgical history, problem list, medications, allergies, family history and social history were all reviewed and documented in the EPIC chart.  Directed ROS with pertinent positives and negatives documented in the history of present illness/assessment and plan.  Exam: [name] assistant Vitals:   09/24/18 0957  BP: 122/78   General appearance:  Normal Abdomen soft nontender without masses guarding rebound Pelvic external BUS vagina with atrophic changes.  Bimanual exam without masses or tenderness  Colposcopy performed of the vagina after acetic acid cleanse from vaginal cuff to introital opening with no abnormalities seen.  No biopsies taken.  Assessment/Plan:  68 y.o. 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  Positive HPV in the past but negative on rescreening.  Colposcopy is normal.  Recommend follow-up Pap smear in 1 year.    [name] MD, 10:22 AM 09/24/2018

## 2018-09-24 NOTE — Patient Instructions (Signed)
Follow-up for annual exam in 1 year

## 2018-11-04 DIAGNOSIS — Z23 Encounter for immunization: Secondary | ICD-10-CM | POA: Diagnosis not present

## 2018-11-30 DIAGNOSIS — L249 Irritant contact dermatitis, unspecified cause: Secondary | ICD-10-CM | POA: Diagnosis not present

## 2018-11-30 DIAGNOSIS — R1013 Epigastric pain: Secondary | ICD-10-CM | POA: Diagnosis not present

## 2018-11-30 DIAGNOSIS — I10 Essential (primary) hypertension: Secondary | ICD-10-CM | POA: Diagnosis not present

## 2018-11-30 DIAGNOSIS — I471 Supraventricular tachycardia: Secondary | ICD-10-CM | POA: Diagnosis not present

## 2018-11-30 DIAGNOSIS — Z0000 Encounter for general adult medical examination without abnormal findings: Secondary | ICD-10-CM | POA: Diagnosis not present

## 2018-11-30 DIAGNOSIS — N6099 Unspecified benign mammary dysplasia of unspecified breast: Secondary | ICD-10-CM | POA: Diagnosis not present

## 2018-11-30 DIAGNOSIS — F418 Other specified anxiety disorders: Secondary | ICD-10-CM | POA: Diagnosis not present

## 2018-11-30 DIAGNOSIS — E782 Mixed hyperlipidemia: Secondary | ICD-10-CM | POA: Diagnosis not present

## 2018-12-15 ENCOUNTER — Encounter: Payer: Self-pay | Admitting: Gynecology

## 2018-12-22 DIAGNOSIS — L821 Other seborrheic keratosis: Secondary | ICD-10-CM | POA: Diagnosis not present

## 2018-12-22 DIAGNOSIS — L820 Inflamed seborrheic keratosis: Secondary | ICD-10-CM | POA: Diagnosis not present

## 2018-12-22 DIAGNOSIS — L570 Actinic keratosis: Secondary | ICD-10-CM | POA: Diagnosis not present

## 2019-02-09 DIAGNOSIS — E782 Mixed hyperlipidemia: Secondary | ICD-10-CM | POA: Diagnosis not present

## 2019-02-09 DIAGNOSIS — R946 Abnormal results of thyroid function studies: Secondary | ICD-10-CM | POA: Diagnosis not present

## 2019-02-09 DIAGNOSIS — I10 Essential (primary) hypertension: Secondary | ICD-10-CM | POA: Diagnosis not present

## 2019-02-09 DIAGNOSIS — Z79899 Other long term (current) drug therapy: Secondary | ICD-10-CM | POA: Diagnosis not present

## 2019-03-14 DIAGNOSIS — R109 Unspecified abdominal pain: Secondary | ICD-10-CM | POA: Diagnosis not present

## 2019-03-16 DIAGNOSIS — L814 Other melanin hyperpigmentation: Secondary | ICD-10-CM | POA: Diagnosis not present

## 2019-03-16 DIAGNOSIS — D1801 Hemangioma of skin and subcutaneous tissue: Secondary | ICD-10-CM | POA: Diagnosis not present

## 2019-03-16 DIAGNOSIS — D225 Melanocytic nevi of trunk: Secondary | ICD-10-CM | POA: Diagnosis not present

## 2019-03-16 DIAGNOSIS — Z85828 Personal history of other malignant neoplasm of skin: Secondary | ICD-10-CM | POA: Diagnosis not present

## 2019-03-16 DIAGNOSIS — L821 Other seborrheic keratosis: Secondary | ICD-10-CM | POA: Diagnosis not present

## 2019-03-16 DIAGNOSIS — L905 Scar conditions and fibrosis of skin: Secondary | ICD-10-CM | POA: Diagnosis not present

## 2019-03-16 DIAGNOSIS — L570 Actinic keratosis: Secondary | ICD-10-CM | POA: Diagnosis not present

## 2019-03-18 ENCOUNTER — Ambulatory Visit
Admission: RE | Admit: 2019-03-18 | Discharge: 2019-03-18 | Disposition: A | Discharge status: Home / Self Care | Payer: Medicare Other | Source: Ambulatory Visit | Attending: Gynecology | Admitting: Gynecology

## 2019-03-18 ENCOUNTER — Other Ambulatory Visit: Payer: Self-pay

## 2019-03-18 ENCOUNTER — Other Ambulatory Visit: Payer: Self-pay | Admitting: Obstetrics and Gynecology

## 2019-03-18 DIAGNOSIS — R928 Other abnormal and inconclusive findings on diagnostic imaging of breast: Secondary | ICD-10-CM

## 2019-03-18 DIAGNOSIS — N63 Unspecified lump in unspecified breast: Secondary | ICD-10-CM

## 2019-03-18 DIAGNOSIS — N6313 Unspecified lump in the right breast, lower outer quadrant: Secondary | ICD-10-CM | POA: Diagnosis not present

## 2019-03-18 DIAGNOSIS — N6314 Unspecified lump in the right breast, lower inner quadrant: Secondary | ICD-10-CM | POA: Diagnosis not present

## 2019-04-04 NOTE — Progress Notes (Signed)
Thanks

## 2019-04-10 ENCOUNTER — Ambulatory Visit: Payer: Medicare Other

## 2019-04-10 DIAGNOSIS — Z23 Encounter for immunization: Secondary | ICD-10-CM | POA: Diagnosis not present

## 2019-04-17 ENCOUNTER — Ambulatory Visit: Payer: Medicare Other

## 2019-04-21 ENCOUNTER — Ambulatory Visit: Payer: Medicare Other

## 2019-05-08 DIAGNOSIS — Z23 Encounter for immunization: Secondary | ICD-10-CM | POA: Diagnosis not present

## 2019-06-08 DIAGNOSIS — Z889 Allergy status to unspecified drugs, medicaments and biological substances status: Secondary | ICD-10-CM | POA: Diagnosis not present

## 2019-08-09 DIAGNOSIS — Z889 Allergy status to unspecified drugs, medicaments and biological substances status: Secondary | ICD-10-CM | POA: Diagnosis not present

## 2019-09-05 ENCOUNTER — Encounter: Payer: Medicare Other | Admitting: Gynecology

## 2019-09-05 ENCOUNTER — Encounter: Payer: Medicare Other | Admitting: Obstetrics and Gynecology

## 2019-09-06 ENCOUNTER — Other Ambulatory Visit: Payer: Self-pay

## 2019-09-07 ENCOUNTER — Encounter: Payer: Self-pay | Admitting: Obstetrics and Gynecology

## 2019-09-07 ENCOUNTER — Ambulatory Visit (INDEPENDENT_AMBULATORY_CARE_PROVIDER_SITE_OTHER): Payer: Medicare Other | Admitting: Obstetrics and Gynecology

## 2019-09-07 VITALS — BP 122/74 | Ht 70.0 in | Wt 168.0 lb

## 2019-09-07 DIAGNOSIS — F5101 Primary insomnia: Secondary | ICD-10-CM | POA: Diagnosis not present

## 2019-09-07 DIAGNOSIS — R87612 Low grade squamous intraepithelial lesion on cytologic smear of cervix (LGSIL): Secondary | ICD-10-CM

## 2019-09-07 DIAGNOSIS — Z79890 Hormone replacement therapy (postmenopausal): Secondary | ICD-10-CM | POA: Diagnosis not present

## 2019-09-07 DIAGNOSIS — Z01419 Encounter for gynecological examination (general) (routine) without abnormal findings: Secondary | ICD-10-CM | POA: Diagnosis not present

## 2019-09-07 DIAGNOSIS — N941 Unspecified dyspareunia: Secondary | ICD-10-CM | POA: Diagnosis not present

## 2019-09-07 DIAGNOSIS — Z8742 Personal history of other diseases of the female genital tract: Secondary | ICD-10-CM | POA: Diagnosis not present

## 2019-09-07 DIAGNOSIS — N952 Postmenopausal atrophic vaginitis: Secondary | ICD-10-CM | POA: Diagnosis not present

## 2019-09-07 DIAGNOSIS — Z1272 Encounter for screening for malignant neoplasm of vagina: Secondary | ICD-10-CM | POA: Diagnosis not present

## 2019-09-07 DIAGNOSIS — R87620 Atypical squamous cells of undetermined significance on cytologic smear of vagina (ASC-US): Secondary | ICD-10-CM | POA: Diagnosis not present

## 2019-09-07 DIAGNOSIS — Z9189 Other specified personal risk factors, not elsewhere classified: Secondary | ICD-10-CM | POA: Diagnosis not present

## 2019-09-07 MED ORDER — ESTRADIOL 1 MG PO TABS: ORAL_TABLET | ORAL | 4 refills | Status: DC

## 2019-09-07 MED ORDER — ZOLPIDEM TARTRATE 5 MG PO TABS: ORAL_TABLET | ORAL | 4 refills | Status: DC

## 2019-09-07 MED ORDER — ESTRADIOL 0.1 MG/GM VA CREA: 1.0000 | TOPICAL_CREAM | Freq: Every day | VAGINAL | 12 refills | Status: DC

## 2019-09-07 NOTE — Progress Notes (Signed)
Pamela Whitehead [birth year] [id number]  SUBJECTIVE:  69 y.o. G1P0010 female here for an annual routine gynecologic exam and Pap smear. She has been having irritation during and after intercourse mostly concentrated in the periclitoral area.  He has tried K-Y jelly and this has not really helped.  Sometimes applying Vaseline after intercourse helps a little bit.  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  Current Outpatient Medications  Medication Sig Dispense Refill  . ALPRAZolam (XANAX) 1 MG tablet Take 0.25 mg by mouth at bedtime as needed for anxiety.    Marland Kitchen EPINEPHrine 0.3 mg/0.3 mL IJ SOAJ injection Inject 0.3 mLs (0.3 mg total) into the muscle as needed for anaphylaxis. 1 Device 0  . estradiol (ESTRACE) 1 MG tablet TAKE 1 TABLET(1 MG) BY MOUTH DAILY 90 tablet 4  . hydrochlorothiazide (MICROZIDE) 12.5 MG capsule Take 12.5 mg by mouth daily.    Marland Kitchen loratadine (CLARITIN) 10 MG tablet Take 10 mg by mouth daily.    . metoprolol (LOPRESSOR) 50 MG tablet Take 50 mg by mouth 2 (two) times daily.    . mometasone (NASONEX) 50 MCG/ACT nasal spray Place 2 sprays into the nose daily.    Marland Kitchen omeprazole (PRILOSEC) 40 MG capsule Take 40 mg by mouth daily.    . RABEprazole (ACIPHEX) 20 MG tablet Take 20 mg by mouth daily.    . valsartan (DIOVAN) 40 MG tablet Take 40 mg by mouth daily.    Marland Kitchen zolpidem (AMBIEN) 5 MG tablet TAKE 1 TABLET(5 MG) BY MOUTH AT BEDTIME AS NEEDED FOR SLEEP 30 tablet 4   No current facility-administered medications for this visit.   Allergies: Celebrex [celecoxib] and Penicillins  No LMP recorded. Patient has had a hysterectomy.  Past medical history,surgical history, problem list, medications, allergies, family history and social history were all reviewed and documented as reviewed in the EPIC chart.  ROS:  Feeling well. No dyspnea or chest pain on exertion.  No abdominal pain, change in bowel habits, black or bloody stools.  No urinary tract symptoms. GYN ROS: no abnormal bleeding,  pelvic pain or discharge, no breast pain or new or enlarging lumps on self exam. No neurological complaints.   OBJECTIVE:  BP 122/74   Ht 5\' 10"  (1.778 m)   Wt 168 lb (76.2 kg)   BMI 24.11 kg/m  The patient appears well, alert, oriented x 3, in no distress. ENT normal.  Neck supple. No cervical or supraclavicular adenopathy or thyromegaly.  Lungs are clear, good air entry, no wheezes, rhonchi or rales. S1 and S2 normal, no murmurs, regular rate and rhythm.  Abdomen soft without tenderness, guarding, mass or organomegaly.  Neurological is normal, no focal findings.  BREAST EXAM: breasts appear normal, no suspicious masses, no skin or nipple changes or axillary nodes  PELVIC EXAM: VULVA: normal appearing vulva with no masses, tenderness or lesions, atrophic changes noted particularly around the periclitoral area, VAGINA: normal appearing vagina with normal color and discharge, no lesions, CERVIX: surgically absent, UTERUS: surgically absent, vaginal cuff normal, ADNEXA: no masses, no tenderness, PAP: Pap smear done today from vaginal cuff, thin-prep method  Chaperone: [name] present during the examination  ASSESSMENT:  69 y.o. G1P0010 here for annual gynecologic exam  PLAN:   1. Postmenopausal/HRT/atrophic vaginitis. Prior hysterectomy for benign purposes.  She uses estradiol 1 mg daily and had previously tried to decrease her dose a few years back but had return of headaches and vasomotor symptoms so she went back on the 1 mg daily dose.  She is now having some dyspareunia due to vulvovaginal atrophy/atrophic vaginitis, so we discussed trying other types of lubricants versus vaginal estrogen therapy.  She understands that with use of vaginal estrogen there is some systemic absorption that results in increased estrogen levels especially in combination with the oral therapy she is already taking, which would potentially further increase the risks of thrombotic diseases to include heart  attack, stroke, DVT, PE and the breast cancer issue.  She agrees to try cutting her estradiol tablets in half so she is taking 0.5 mg daily while going on the vaginal estrogen cream, I sent her a prescription for Estrace which she will apply nightly for 1 month and then decrease use to twice per week.  She will follow-up if there are any ongoing issues.  Refill is provided for the estradiol tablets x1 year (at the 1 mg dose in case she does not do well on the lower dose). 2.  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  History ASCUS negative high-risk HPV in 2017, 2018, 2019.  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  Vaginal cuff Pap smear is collected today.   3. Mammogram scheduled on 09/16/2019.  Normal breast exam today.  4.  Insomnia.  Uses Ambien 5 mg intermittently.  Discussed risks and benefits and she would like to continue with intermittent use, typically only takes 1/2 tablet.  Refill #30 with 4 refills. 5. Colonoscopy 2014.  Recommended that she follow up at the recommended interval.  6. Mammogram 09/2018.  Normal breast exam today.  Continue with annual mammograms. 7. DEXA 2019 normal.  2024.  Next DEXA recommended 2024.  8. Health maintenance.  No labs today as she normally has these completed elsewhere.  Return annually or sooner, prn.  Joseph Pierini MD 09/07/19

## 2019-09-07 NOTE — Addendum Note (Signed)
Addended by: Nelva Nay on: 09/07/2019 09:56 AM   Modules accepted: Orders

## 2019-09-07 NOTE — Patient Instructions (Signed)
Please try different lubricants to see if any ones work better for you than what you have already been using. Please try to decrease the oral estrogen dose by cutting the tablet in half so you are getting 0.5 mg estradiol daily. This is especially important to try if you are also adding additional estrogen to your system by using the vaginal estrogen cream (Estrace). Apply the Estrace vaginal cream using the applicator nightly for 1 month and then decrease the use to twice per week. If you find the Estrace is unusually expensive, there is an option to have one of the pharmacies in town make a custom compound which generally is more affordable.  Just let us know!

## 2019-09-14 ENCOUNTER — Telehealth: Payer: Self-pay

## 2019-09-14 NOTE — Telephone Encounter (Signed)
Patient called in triage voice mail stating she would like Kim to call her back as she has a question about her visit.  I called her back and left message in voice mail explaining Pamela Whitehead is with the physician assisting with patients and not much time for telephone and I am happy to have her and do have access to her office visit notes.  I left direct number for her to call back.

## 2019-09-15 LAB — PAP IG W/ RFLX HPV ASCU

## 2019-09-15 LAB — HUMAN PAPILLOMAVIRUS, HIGH RISK: HPV DNA High Risk: NOT DETECTED

## 2019-09-15 NOTE — Telephone Encounter (Signed)
Patient called back. What she was calling regarding was actually personal and not visit related. I forwarded her message to Onalee Hua.

## 2019-09-16 ENCOUNTER — Ambulatory Visit
Admission: RE | Admit: 2019-09-16 | Discharge: 2019-09-16 | Disposition: A | Discharge status: Home / Self Care | Payer: Medicare Other | Source: Ambulatory Visit | Attending: Obstetrics and Gynecology | Admitting: Obstetrics and Gynecology

## 2019-09-16 ENCOUNTER — Other Ambulatory Visit: Payer: Self-pay

## 2019-09-16 DIAGNOSIS — N6313 Unspecified lump in the right breast, lower outer quadrant: Secondary | ICD-10-CM | POA: Diagnosis not present

## 2019-09-16 DIAGNOSIS — R922 Inconclusive mammogram: Secondary | ICD-10-CM | POA: Diagnosis not present

## 2019-09-16 DIAGNOSIS — R928 Other abnormal and inconclusive findings on diagnostic imaging of breast: Secondary | ICD-10-CM

## 2019-09-16 DIAGNOSIS — R946 Abnormal results of thyroid function studies: Secondary | ICD-10-CM | POA: Diagnosis not present

## 2019-09-16 DIAGNOSIS — N63 Unspecified lump in unspecified breast: Secondary | ICD-10-CM

## 2019-09-16 DIAGNOSIS — N6314 Unspecified lump in the right breast, lower inner quadrant: Secondary | ICD-10-CM | POA: Diagnosis not present

## 2019-09-16 DIAGNOSIS — I10 Essential (primary) hypertension: Secondary | ICD-10-CM | POA: Diagnosis not present

## 2019-09-16 DIAGNOSIS — E782 Mixed hyperlipidemia: Secondary | ICD-10-CM | POA: Diagnosis not present

## 2019-09-18 ENCOUNTER — Emergency Department (HOSPITAL_BASED_OUTPATIENT_CLINIC_OR_DEPARTMENT_OTHER)
Admission: EM | Admit: 2019-09-18 | Discharge: 2019-09-18 | Disposition: A | Discharge status: Home / Self Care | Payer: Medicare Other | Attending: Emergency Medicine | Admitting: Emergency Medicine

## 2019-09-18 ENCOUNTER — Encounter (HOSPITAL_BASED_OUTPATIENT_CLINIC_OR_DEPARTMENT_OTHER): Payer: Self-pay

## 2019-09-18 ENCOUNTER — Emergency Department (HOSPITAL_BASED_OUTPATIENT_CLINIC_OR_DEPARTMENT_OTHER): Payer: Medicare Other

## 2019-09-18 ENCOUNTER — Other Ambulatory Visit: Payer: Self-pay

## 2019-09-18 DIAGNOSIS — K8071 Calculus of gallbladder and bile duct without cholecystitis with obstruction: Secondary | ICD-10-CM | POA: Insufficient documentation

## 2019-09-18 DIAGNOSIS — R10816 Epigastric abdominal tenderness: Secondary | ICD-10-CM | POA: Diagnosis not present

## 2019-09-18 DIAGNOSIS — R945 Abnormal results of liver function studies: Secondary | ICD-10-CM | POA: Diagnosis not present

## 2019-09-18 DIAGNOSIS — K839 Disease of biliary tract, unspecified: Secondary | ICD-10-CM | POA: Insufficient documentation

## 2019-09-18 DIAGNOSIS — K838 Other specified diseases of biliary tract: Secondary | ICD-10-CM | POA: Diagnosis not present

## 2019-09-18 DIAGNOSIS — R7989 Other specified abnormal findings of blood chemistry: Secondary | ICD-10-CM

## 2019-09-18 DIAGNOSIS — K828 Other specified diseases of gallbladder: Secondary | ICD-10-CM | POA: Diagnosis not present

## 2019-09-18 DIAGNOSIS — I10 Essential (primary) hypertension: Secondary | ICD-10-CM | POA: Diagnosis not present

## 2019-09-18 DIAGNOSIS — R109 Unspecified abdominal pain: Secondary | ICD-10-CM | POA: Diagnosis present

## 2019-09-18 DIAGNOSIS — K8063 Calculus of gallbladder and bile duct with acute cholecystitis with obstruction: Secondary | ICD-10-CM | POA: Diagnosis not present

## 2019-09-18 DIAGNOSIS — N83292 Other ovarian cyst, left side: Secondary | ICD-10-CM | POA: Diagnosis not present

## 2019-09-18 DIAGNOSIS — K8020 Calculus of gallbladder without cholecystitis without obstruction: Secondary | ICD-10-CM | POA: Diagnosis not present

## 2019-09-18 DIAGNOSIS — K8021 Calculus of gallbladder without cholecystitis with obstruction: Secondary | ICD-10-CM

## 2019-09-18 LAB — COMPREHENSIVE METABOLIC PANEL
ALT: 149 U/L — ABNORMAL HIGH (ref 0–44)
AST: 57 U/L — ABNORMAL HIGH (ref 15–41)
Albumin: 4.1 g/dL (ref 3.5–5.0)
Alkaline Phosphatase: 373 U/L — ABNORMAL HIGH (ref 38–126)
Anion gap: 13 (ref 5–15)
BUN: 22 mg/dL (ref 8–23)
CO2: 26 mmol/L (ref 22–32)
Calcium: 9.3 mg/dL (ref 8.9–10.3)
Chloride: 98 mmol/L (ref 98–111)
Creatinine, Ser: 0.98 mg/dL (ref 0.44–1.00)
GFR calc Af Amer: >60 mL/min (ref >60)
GFR calc non Af Amer: 59 mL/min — ABNORMAL LOW (ref >60)
Glucose, Bld: 104 mg/dL — ABNORMAL HIGH (ref 70–99)
Potassium: 3.5 mmol/L (ref 3.5–5.1)
Sodium: 137 mmol/L (ref 135–145)
Total Bilirubin: 0.6 mg/dL (ref 0.3–1.2)
Total Protein: 7.2 g/dL (ref 6.5–8.1)

## 2019-09-18 LAB — URINALYSIS, ROUTINE W REFLEX MICROSCOPIC
Bilirubin Urine: NEGATIVE
Glucose, UA: NEGATIVE mg/dL
Ketones, ur: NEGATIVE mg/dL
Leukocytes,Ua: NEGATIVE
Nitrite: NEGATIVE
Protein, ur: NEGATIVE mg/dL
Specific Gravity, Urine: 1.015 (ref 1.005–1.030)
pH: 6.5 (ref 5.0–8.0)

## 2019-09-18 LAB — CBC
HCT: 40.4 % (ref 36.0–46.0)
Hemoglobin: 13.5 g/dL (ref 12.0–15.0)
MCH: 31.8 pg (ref 26.0–34.0)
MCHC: 33.4 g/dL (ref 30.0–36.0)
MCV: 95.3 fL (ref 80.0–100.0)
Platelets: 317 10*3/uL (ref 150–400)
RBC: 4.24 MIL/uL (ref 3.87–5.11)
RDW: 12.4 % (ref 11.5–15.5)
WBC: 5.7 10*3/uL (ref 4.0–10.5)
nRBC: 0 % (ref 0.0–0.2)

## 2019-09-18 LAB — URINALYSIS, MICROSCOPIC (REFLEX)

## 2019-09-18 LAB — LIPASE, BLOOD: Lipase: 33 U/L (ref 11–51)

## 2019-09-18 MED ORDER — IOHEXOL 300 MG/ML  SOLN
100.0000 mL | Freq: Once | INTRAMUSCULAR | Status: AC | PRN
  Administered 2019-09-18: 100 mL via INTRAVENOUS

## 2019-09-18 NOTE — ED Triage Notes (Signed)
Pt arrives with c/o abdominal pain X5-6 months, pt states that it gets worse with eating. Pt states that she had an ulcer diagnosed when she was younger has not had EGD since. Had blood work recently done at PCP office and advised to come to ED.

## 2019-09-18 NOTE — ED Provider Notes (Signed)
[facility] EMERGENCY DEPARTMENT Provider Note   CSN: [id number] Arrival date & time: 09/18/19  9628     History Chief Complaint  Patient presents with  . Abdominal Pain    Pamela Whitehead is a 69 y.o. female.  HPI   Patient presents the emergency room for evaluation of abnormal liver tests and abdominal pain.  Patient states she has been having this pain about 5 to 6 months now.  She does have a history of H. pylori and was previously treated for that so she felt it was most likely a recurrence of that issue.  Patient was managing it on her own with antacids.  The pain would come and go.  Oftentimes it would hurt while she was at work.  She was tried to take antacids.  Patient's had a couple of episodes of nausea and vomiting over the last several months but nothing significant.  She denies any diarrhea or dysuria.  Patient states she lost about 5 pounds but does not think she is had any excessive weight loss.  Patient had an appointment with her OB/GYN and she also had some outpatient laboratory tests performed recently by her primary care doctor.  Patient was noted to have elevations in her liver enzymes.  Yesterday she had rather intense severe pain.  She spoke to her primary care doctor, Dr. Sheryn Bison who is a personal friend of hers.  With her abnormal liver tests recently and the severe pain he recommended she come to the ED and he accompanied her here today.  Patient denies any alcohol use.  She does occasionally take Tylenol but not excessively.  Past Medical History:  Diagnosis Date  . Anaphylactic reaction    Unknown cause  . 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 Atypical squamous cell changes of undetermined significance (ASCUS) on vaginal cytology 12/2015, 05/2017   Negative high-risk HPV screen  . DES exposure in utero   . Endometriosis   . Hypertension   .  Kidney infection    June 2014  . Peptic ulcer    currently Oct 2015  . Spondylisthesis   . Ulcer   . 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 Microscopic hematuria 04/29/2011  . Hypertension   . Ulcer   . Anaphylactic reaction     Past Surgical History:  Procedure Laterality Date  . BREAST EXCISIONAL BIOPSY Left 2011   Benign  . BREAST SURGERY  02/25/2010   left breast biopsy ..for left breast mass. . . for [facility]  . ECTOPIC PREGNANCY SURGERY    . KNEE SURGERY  2003  . RECTAL SURGERY    . VAGINAL HYSTERECTOMY  1993   vaginal hysterectomy     OB History    Gravida  1   Para  0   Term      Preterm      AB  1   Living  0     SAB  0   TAB      Ectopic  1   Multiple      Live Births              Family History  Problem Relation Age of Onset  . Hypertension Mother   . Dementia Mother   . Stroke Mother   . Heart disease Father   .  Breast cancer Maternal Aunt        Age 54's  . Heart disease Brother   . Melanoma Brother   . Stroke Brother     Social History   Tobacco Use  . Smoking status: Never Smoker  . Smokeless tobacco: Never Used  Vaping Use  . Vaping Use: Never used  Substance Use Topics  . Alcohol use: Yes    Alcohol/week: 0.0 standard drinks    Comment: rare  . Drug use: No    Home Medications Prior to Admission medications   Medication Sig Start Date End Date Taking? Authorizing Provider  ALPRAZolam Duanne Moron) 1 MG tablet Take 0.25 mg by mouth at bedtime as needed for anxiety.    [provider]  EPINEPHrine 0.3 mg/0.3 mL IJ SOAJ injection Inject 0.3 mLs (0.3 mg total) into the muscle as needed for anaphylaxis. 06/14/18   Fontaine, Belinda Block, MD  estradiol (ESTRACE VAGINAL) 0.1 MG/GM vaginal cream Place 1 Applicatorful vaginally at bedtime. Nightly for 1 month then twice weekly after that. 09/07/19   Joseph Pierini, MD  estradiol (ESTRACE) 1 MG  tablet TAKE 1 TABLET(1 MG) BY MOUTH DAILY 09/07/19   Joseph Pierini, MD  hydrochlorothiazide (MICROZIDE) 12.5 MG capsule Take 12.5 mg by mouth daily.    [provider]  loratadine (CLARITIN) 10 MG tablet Take 10 mg by mouth daily.    [provider]  metoprolol (LOPRESSOR) 50 MG tablet Take 50 mg by mouth 2 (two) times daily.    [provider]  mometasone (NASONEX) 50 MCG/ACT nasal spray Place 2 sprays into the nose daily.    [provider]  omeprazole (PRILOSEC) 40 MG capsule Take 40 mg by mouth daily.    [provider]  RABEprazole (ACIPHEX) 20 MG tablet Take 20 mg by mouth daily.    [provider]  valsartan (DIOVAN) 40 MG tablet Take 40 mg by mouth daily.    [provider]  zolpidem (AMBIEN) 5 MG tablet TAKE 1 TABLET(5 MG) BY MOUTH AT BEDTIME AS NEEDED FOR SLEEP 09/07/19   Joseph Pierini, MD    Allergies    Celebrex [celecoxib], Other, and Penicillins  Review of Systems   Review of Systems  All other systems reviewed and are negative.   Physical Exam Updated Vital Signs BP 125/73 (BP Location: Right Arm)   Pulse (!) 56   Temp 98 F (36.7 C) (Oral)   Resp 16   Ht 1.778 m (5' 10" )   Wt 76.2 kg   SpO2 100%   BMI 24.11 kg/m   Physical Exam Vitals and nursing note reviewed.  Constitutional:      General: She is not in acute distress.    Appearance: She is well-developed.  HENT:     Head: Normocephalic and atraumatic.     Right Ear: External ear normal.     Left Ear: External ear normal.  Eyes:     General: No scleral icterus.       Right eye: No discharge.        Left eye: No discharge.     Conjunctiva/sclera: Conjunctivae normal.  Neck:     Trachea: No tracheal deviation.  Cardiovascular:     Rate and Rhythm: Normal rate and regular rhythm.  Pulmonary:     Effort: Pulmonary effort is normal. No respiratory distress.     Breath sounds: Normal breath sounds. No stridor. No wheezing or rales.    Abdominal:     General: Bowel sounds  are normal. There is no distension.     Palpations: Abdomen is soft. There is no hepatomegaly or mass.     Tenderness: There is abdominal tenderness in the epigastric area. There is no guarding or rebound.     Hernia: No hernia is present.     Comments: Mild tenderness epigastric region  Musculoskeletal:        General: No tenderness.     Cervical back: Neck supple.  Skin:    General: Skin is warm and dry.     Findings: No rash.  Neurological:     Mental Status: She is alert.     Cranial Nerves: No cranial nerve deficit (no facial droop, extraocular movements intact, no slurred speech).     Sensory: No sensory deficit.     Motor: No abnormal muscle tone or seizure activity.     Coordination: Coordination normal.     ED Results / Procedures / Treatments   Labs (all labs ordered are listed, but only abnormal results are displayed) Labs Reviewed  COMPREHENSIVE METABOLIC PANEL - Abnormal; Notable for the following components:      Result Value   Glucose, Bld 104 (*)    AST 57 (*)    ALT 149 (*)    Alkaline Phosphatase 373 (*)    GFR calc non Af Amer 59 (*)    All other components within normal limits  URINALYSIS, ROUTINE W REFLEX MICROSCOPIC - Abnormal; Notable for the following components:   APPearance HAZY (*)    Hgb urine dipstick SMALL (*)    All other components within normal limits  URINALYSIS, MICROSCOPIC (REFLEX) - Abnormal; Notable for the following components:   Bacteria, UA MANY (*)    All other components within normal limits  LIPASE, BLOOD  CBC    EKG None  Radiology CT ABDOMEN PELVIS W CONTRAST  Result Date: 09/18/2019 CLINICAL DATA:  Jaundice and abdominal pain. EXAM: CT ABDOMEN AND PELVIS WITH CONTRAST TECHNIQUE: Multidetector CT imaging of the abdomen and pelvis was performed using the standard protocol following bolus administration of intravenous contrast. CONTRAST:  175m OMNIPAQUE IOHEXOL 300 MG/ML  SOLN  COMPARISON:  None. FINDINGS: Lower chest: No acute abnormality. Hepatobiliary: Mild hepatic steatosis. No liver masses. The portal vein is patent. Mild intra and extrahepatic biliary duct dilatation is identified. The common bile duct measures up to 1 cm as seen on coronal image 28, tapering to normal caliber in the pancreas. No filling defects are seen in the common bile duct. There is at least 1 small stone in the gallbladder. No wall thickening. The gallbladder is mildly distended. Pancreas: Pancreatic duct is normal in caliber. No peripancreatic stranding identified. No pancreatic masses noted. Spleen: Normal in size without focal abnormality. Adrenals/Urinary Tract: Adrenal glands are unremarkable. Kidneys are normal, without renal calculi, focal lesion, or hydronephrosis. Bladder is unremarkable. Stomach/Bowel: Stomach is within normal limits. Appendix appears normal. No evidence of bowel wall thickening, distention, or inflammatory changes. Vascular/Lymphatic: Calcified atherosclerosis is seen in the nonaneurysmal tortuous aorta. No adenopathy. Reproductive: Patient is status post hysterectomy. The retained right ovary is normal. There is a cyst in the retained left ovary measuring up to 5.5 cm. There appears to be a septation within this cyst best seen on coronal image 42. There is also calcification along the periphery of this cyst seen on coronal image 32. Other: No abdominal wall hernia or abnormality. No abdominopelvic ascites. Musculoskeletal: No acute or significant osseous findings. IMPRESSION: 1. Intra and extrahepatic biliary duct dilatation.  No underlying cause is seen. This likely correlates with the patient's reported jaundice. Recommend MRCP or ERCP for further evaluation. 2. At least 1 small stone is seen in a mildly distended gallbladder without wall thickening. 3. 5.5 cm mildly complicated septated cyst in the left ovary. Recommend pelvic ultrasound for further evaluation. 4. Abdominal aortic  atherosclerosis. Electronically Signed   By: Dorise Bullion III M.D   On: 09/18/2019 11:23    Procedures Procedures (including critical care time)  Medications Ordered in ED Medications  iohexol (OMNIPAQUE) 300 MG/ML solution 100 mL (100 mLs Intravenous Contrast Given 09/18/19 1041)    ED Course  I have reviewed the triage vital signs and the nursing notes.  Pertinent labs & imaging results that were available during my care of the patient were reviewed by me and considered in my medical decision making (see chart for details).  Clinical Course as of Sep 17 1229  Sun Sep 18, 2019  1205 Labs and cT reviewed with pt and Dr Sheryn Bison.  Pt does have elevated lfts.  CT scan shows extra and intrahepatic biliary dilatation.  Gallstone but no signs of cholecystitis.    [JK]    Clinical Course User Index [JK] Dorie Rank, MD   MDM Rules/Calculators/A&P                          Patient presented to ED for evaluation of intermittent abdominal pain over the last 5 to 6 months and recently abnormal LFTs.  Patient follows up regularly with her primary care doctor Dr. Sheryn Bison.  He happens to be a personal friend that actually accompanied her here to the ED.  Patient had normal LFTs about 6 months ago.  She also tested positive for Helicobacter.  Patient was treated for that in felt like she improved.  When she started having these intermittent symptoms she initially just attributed it to her peptic ulcer disease.  Patient had abnormalities recently.  Those were repeated.  She continues to have elevated alk phos AST and ALT but no evidence of hyperbilirubinemia.  CT scan was performed today.  Patient does have extra and intrahepatic biliary duct dilatation.  No obvious mass.  She does have a gallstone but findings not suggestive of cholecystitis.  Ultrasound and MRI not available at this facility today.  I discussed the case with Dr. Pamalee Leyden gastroenterology.  He felt outpatient follow-up was  reasonable as long as the patient was not having any significant pain.  Patient will need to have an MRCP procedure.  Patient is comfortable here and I have not provide any pain medications.  I discussed the findings with both the patient and Dr. Sheryn Bison.  Patient is comfortable with outpatient follow-up.  I did stress if she has any severe pain or fever or vomiting she should return to either [facility] or [facility] ED. Final Clinical Impression(s) / ED Diagnoses Final diagnoses:  Biliary tract distention  Abnormal LFTs  Calculus of gallbladder with biliary obstruction but without cholecystitis    Rx / DC Orders ED Discharge Orders    None       Dorie Rank, MD 09/18/19 1232

## 2019-09-18 NOTE — ED Notes (Signed)
ED Provider at bedside. 

## 2019-09-18 NOTE — ED Notes (Signed)
Pt walked well to the bathroom while this writer, Brightyn Mozer NT, was on standby.

## 2019-09-18 NOTE — Discharge Instructions (Addendum)
Follow-up with the GI doctors for the MRCP procedure.  Return to the emergency room ([facility] or [facility]) if you start having recurrent severe pain, fevers or vomiting.

## 2019-09-18 NOTE — ED Notes (Signed)
Reports elevated liver enzymes with most recent blood work.

## 2019-09-27 DIAGNOSIS — L247 Irritant contact dermatitis due to plants, except food: Secondary | ICD-10-CM | POA: Diagnosis not present

## 2019-09-27 DIAGNOSIS — T148XXA Other injury of unspecified body region, initial encounter: Secondary | ICD-10-CM | POA: Diagnosis not present

## 2019-09-30 DIAGNOSIS — K838 Other specified diseases of biliary tract: Secondary | ICD-10-CM | POA: Diagnosis not present

## 2019-09-30 DIAGNOSIS — R748 Abnormal levels of other serum enzymes: Secondary | ICD-10-CM | POA: Diagnosis not present

## 2019-10-05 ENCOUNTER — Other Ambulatory Visit: Payer: Self-pay | Admitting: Gastroenterology

## 2019-10-05 DIAGNOSIS — R748 Abnormal levels of other serum enzymes: Secondary | ICD-10-CM

## 2019-10-05 DIAGNOSIS — K838 Other specified diseases of biliary tract: Secondary | ICD-10-CM

## 2019-10-20 ENCOUNTER — Other Ambulatory Visit (HOSPITAL_BASED_OUTPATIENT_CLINIC_OR_DEPARTMENT_OTHER): Payer: Self-pay | Admitting: Gastroenterology

## 2019-10-20 DIAGNOSIS — R748 Abnormal levels of other serum enzymes: Secondary | ICD-10-CM

## 2019-10-20 DIAGNOSIS — K838 Other specified diseases of biliary tract: Secondary | ICD-10-CM

## 2019-10-22 ENCOUNTER — Other Ambulatory Visit: Payer: Self-pay

## 2019-10-22 ENCOUNTER — Ambulatory Visit (HOSPITAL_BASED_OUTPATIENT_CLINIC_OR_DEPARTMENT_OTHER)
Admission: RE | Admit: 2019-10-22 | Discharge: 2019-10-22 | Disposition: A | Discharge status: Home / Self Care | Payer: Medicare Other | Source: Ambulatory Visit | Attending: Gastroenterology | Admitting: Gastroenterology

## 2019-10-22 DIAGNOSIS — K8020 Calculus of gallbladder without cholecystitis without obstruction: Secondary | ICD-10-CM | POA: Diagnosis not present

## 2019-10-22 DIAGNOSIS — K838 Other specified diseases of biliary tract: Secondary | ICD-10-CM | POA: Diagnosis not present

## 2019-10-22 DIAGNOSIS — R748 Abnormal levels of other serum enzymes: Secondary | ICD-10-CM | POA: Diagnosis not present

## 2019-10-22 MED ORDER — GADOBUTROL 1 MMOL/ML IV SOLN
7.5000 mL | Freq: Once | INTRAVENOUS | Status: AC | PRN
  Administered 2019-10-22: 7.5 mL via INTRAVENOUS

## 2019-11-01 ENCOUNTER — Other Ambulatory Visit: Payer: Medicare Other

## 2019-11-08 DIAGNOSIS — Z23 Encounter for immunization: Secondary | ICD-10-CM | POA: Diagnosis not present

## 2019-11-24 ENCOUNTER — Ambulatory Visit: Payer: Self-pay | Admitting: General Surgery

## 2019-11-24 DIAGNOSIS — Z23 Encounter for immunization: Secondary | ICD-10-CM | POA: Diagnosis not present

## 2019-11-24 DIAGNOSIS — K8020 Calculus of gallbladder without cholecystitis without obstruction: Secondary | ICD-10-CM | POA: Diagnosis not present

## 2019-11-29 ENCOUNTER — Other Ambulatory Visit: Payer: Self-pay

## 2019-11-29 ENCOUNTER — Telehealth: Payer: Self-pay

## 2019-11-29 DIAGNOSIS — N83202 Unspecified ovarian cyst, left side: Secondary | ICD-10-CM

## 2019-11-29 NOTE — Telephone Encounter (Signed)
Patient was in my voice mail twice today. I had just received staff message from Dr. Delilah Shan:  "This patient had a CT done recently through another office will be undergoing cholecystectomy with Dr. Marlou Starks in general surgery.  The CT also showed that she had a left ovarian cyst 5.5 cm.  Please let this patient know that I would like her to come in for a CA-125 and a CEA.  After the labs have returned and I would like to bring her in for a preoperative consultation for a left oophorectomy (which I would plan to do during the same surgery with Dr. Marlou Starks) which I will do if tumor markers are normal.  I did speak with Dr. Marlou Starks today and he said that his schedulers would be in contact with [place] to try to coordinate a surgery date. "  And shortly after that the scheduler from [facility] for Dr. Marlou Starks was in my voice mail.  I called patient and told her that she needs to come for CEA and CA125 tumor marker tests and if they are normal/not elevated that Dr. Delilah Shan will perform the surgery.  I placed orders and scheduled her a lab appointment for Friday. I told her that after I have time to call the scheduler at [facility] and we get a date that I will call her to confirm it.

## 2019-11-29 NOTE — Telephone Encounter (Signed)
I spoke with Pamela Whitehead at [facility] [phone].  I explained to her that patient will be having tumor marker labs done and if elevated Dr. Delilah Shan will not be doing surgery. She suggested we wait on the results and then schedule the surgery accordingly. I told her patient will be here Thursday morning to have blood drawn and office closed on Friday but should have results first of next week. I called patient and explained this plan to her and she is fine with that.

## 2019-12-01 ENCOUNTER — Other Ambulatory Visit: Payer: Self-pay

## 2019-12-01 ENCOUNTER — Other Ambulatory Visit: Payer: Medicare Other

## 2019-12-01 DIAGNOSIS — N83202 Unspecified ovarian cyst, left side: Secondary | ICD-10-CM

## 2019-12-02 LAB — CA 125: CA 125: 14 U/mL (ref <35)

## 2019-12-02 LAB — CEA: CEA: 1.3 ng/mL

## 2019-12-05 ENCOUNTER — Telehealth: Payer: Self-pay

## 2019-12-05 ENCOUNTER — Other Ambulatory Visit: Payer: Self-pay

## 2019-12-05 NOTE — Telephone Encounter (Signed)
Message sent to [facility] to call her and arrange appointment to discuss.

## 2019-12-05 NOTE — Telephone Encounter (Signed)
Yes, that was my plan to have her do either an appointment or video visit prior to surgery. Her labs are normal. Thank you.

## 2019-12-05 NOTE — Telephone Encounter (Signed)
Patient called for lab results. She said she would really like to talk with you, if you could call her, prior to you scheduling surgery with Dr. Marlou Starks.  I can have her schedule office visit or video visit if you would like.

## 2019-12-13 ENCOUNTER — Encounter: Payer: Self-pay | Admitting: Obstetrics and Gynecology

## 2019-12-13 ENCOUNTER — Ambulatory Visit (INDEPENDENT_AMBULATORY_CARE_PROVIDER_SITE_OTHER): Payer: Medicare Other | Admitting: Obstetrics and Gynecology

## 2019-12-13 ENCOUNTER — Other Ambulatory Visit: Payer: Self-pay

## 2019-12-13 VITALS — BP 120/78

## 2019-12-13 DIAGNOSIS — N83202 Unspecified ovarian cyst, left side: Secondary | ICD-10-CM

## 2019-12-13 NOTE — Progress Notes (Signed)
Pamela Whitehead Feb 01, 1951 [id number]  SUBJECTIVE:  69 y.o. G83P0010 female with a prior history of vaginal hysterectomy and endometriosis presents for recommendations regarding an incidental finding of a 5.5 cm left ovarian cyst discovered on CT of the abdomen/pelvis for work-up of abdominal pain.  The patient has been having biliary colic symptoms and is planning to have a cholecystectomy with Dr. Marlou Starks.  He did contact me to discuss this finding of the ovarian cyst, as did her primary doctor, Dr. Sheryn Bison.  Plan has been to coordinate surgery so she can have the cyst and ovary removed concurrently with the gallbladder.  She inquires about having both ovaries removed since we will be working in the pelvis anyway.  She is having gallbladder related pains after eating, but she denies any lower pelvic pain.    Current Outpatient Medications  Medication Sig Dispense Refill  . ALPRAZolam (XANAX) 1 MG tablet Take 0.25 mg by mouth at bedtime as needed for anxiety.    Marland Kitchen EPINEPHrine 0.3 mg/0.3 mL IJ SOAJ injection Inject 0.3 mLs (0.3 mg total) into the muscle as needed for anaphylaxis. 1 Device 0  . estradiol (ESTRACE VAGINAL) 0.1 MG/GM vaginal cream Place 1 Applicatorful vaginally at bedtime. Nightly for 1 month then twice weekly after that. 42.5 g 12  . estradiol (ESTRACE) 1 MG tablet TAKE 1 TABLET(1 MG) BY MOUTH DAILY 90 tablet 4  . hydrochlorothiazide (MICROZIDE) 12.5 MG capsule Take 12.5 mg by mouth daily.    Marland Kitchen loratadine (CLARITIN) 10 MG tablet Take 10 mg by mouth daily.    . metoprolol (LOPRESSOR) 50 MG tablet Take 50 mg by mouth 2 (two) times daily.    . mometasone (NASONEX) 50 MCG/ACT nasal spray Place 2 sprays into the nose daily.    Marland Kitchen omeprazole (PRILOSEC) 40 MG capsule Take 40 mg by mouth daily.    . RABEprazole (ACIPHEX) 20 MG tablet Take 20 mg by mouth daily.    . valsartan (DIOVAN) 40 MG tablet Take 40 mg by mouth daily.    Marland Kitchen zolpidem (AMBIEN) 5 MG tablet TAKE 1 TABLET(5 MG) BY MOUTH AT  BEDTIME AS NEEDED FOR SLEEP 30 tablet 4   No current facility-administered medications for this visit.   Allergies: Celebrex [celecoxib], Other, and Penicillins  No LMP recorded. Patient has had a hysterectomy.  Past medical history,surgical history, problem list, medications, allergies, family history and social history were all reviewed and documented as reviewed in the EPIC chart.  ROS: Pertinent positives and negatives as reviewed above in HPI    OBJECTIVE:  BP 120/78  The patient appears well, alert, oriented, in no distress. PELVIC EXAM: Deferred as this was recently performed earlier this year   ASSESSMENT:  69 y.o. G1P0010 here for operative discussion of laparoscopic BSO to remove incidentally found left ovarian cyst  PLAN:  We will plan to proceed with laparoscopic excision of the left ovary and cyst with fallopian tube, also if able would plan to remove the right ovary and fallopian tube.  We will try to correlate this with Dr. Marlou Starks who will be performing cholecystectomy.  There appears be some complexity within the cyst on CT so we will get a pelvic ultrasound to evaluate that further.  We did check a CA-125 and CEA the other week and they were within normal limits.  Thinking this is more likely a benign process, we will proceed with surgery.  I would recommend removal of the cyst.   I would plan to perform operative  laparoscopy with bilateral salpingo-oophorectomy and pelvic washings.  Depending on how scarred the cyst and/or ovaries may be into the area (previous surgery, history of endometriosis) it may not be possible to remove some or all of the fallopian tubes and ovaries as intended, and possibly we may not be able to remove all or some of the cyst.  We discussed that removal of ovaries and fallopian tubes does not necessarily completely protect against possibility of developing pelvic cancers in the future.  We may be able to use some or all of the incisions used for  the cholecystectomy but may also need to add lower pelvic incisions.  Rupture of the cyst during laparoscopy is almost always inevitable, and if it happens to be malignant then this would upstage the cancer and result in worse prognosis and alter the treatment plan, also possibly requiring additional surgery for further staging biopsies.  Based on the normal tumor markers possibility of the cyst being malignant is less likely.  I discussed some of the expectations of laparoscopic surgery including feeling of bloating, abdominal discomfort and possibly referred pain into the shoulders after surgery.  Postoperative recovery would require at least 1 to 2 weeks of rest from refraining from any heavy physical activity, and restrictions will be per the general surgeon.  The plan would be for a same-day procedure unless there is any complication.  There is always the risk of needing to convert to laparotomy in the case of any intra-abdominal bleeding and/or organ injury and the patient understands this and would be okay with laparotomy in that case.  Risks of surgery include infection, bleeding possibly requiring blood transfusion, risk of injury to internal organs, including bowel, bladder, major pelvic vessels, ureter, and nerves, risk of DVT.  Possibility of delayed recognition of complications is also reviewed, possible need for repeat surgery and/or hospitalization.  General anesthesia also has risks to include heart attack, stroke, death.  Joseph Pierini MD 09-Jan-2020

## 2019-12-17 ENCOUNTER — Other Ambulatory Visit (HOSPITAL_COMMUNITY): Payer: Medicare Other

## 2019-12-20 ENCOUNTER — Other Ambulatory Visit (HOSPITAL_COMMUNITY): Payer: Medicare Other

## 2019-12-23 ENCOUNTER — Ambulatory Visit: Admit: 2019-12-23 | Payer: Medicare Other | Admitting: General Surgery

## 2019-12-23 SURGERY — LAPAROSCOPIC CHOLECYSTECTOMY WITH INTRAOPERATIVE CHOLANGIOGRAM
Anesthesia: General

## 2019-12-27 ENCOUNTER — Ambulatory Visit (INDEPENDENT_AMBULATORY_CARE_PROVIDER_SITE_OTHER): Payer: Medicare Other

## 2019-12-27 ENCOUNTER — Other Ambulatory Visit: Payer: Self-pay

## 2019-12-27 ENCOUNTER — Ambulatory Visit (INDEPENDENT_AMBULATORY_CARE_PROVIDER_SITE_OTHER): Payer: Medicare Other | Admitting: Obstetrics and Gynecology

## 2019-12-27 DIAGNOSIS — N83202 Unspecified ovarian cyst, left side: Secondary | ICD-10-CM

## 2019-12-27 NOTE — Progress Notes (Signed)
   Pamela Whitehead [birth year] [id number]  SUBJECTIVE:  69 y.o. G1P0010 female who is planning a laparoscopic BSO and pelvic washings for prophylaxis and for excision of a left ovarian cyst at the same time of having a cholecystectomy.  She presents today for an ultrasound to better characterize the ovarian cyst that was identified incidentally on recent CT imaging.  We had a telephone conversation hours after her ultrasound appointment today just to review the findings on today's ultrasound.   OBJECTIVE:  Pelvic ultrasound Surgically absent uterus Normal vaginal cuff Right ovary normal size Left ovarian cyst 6.3 x 5.3 cm with a prominent septation along with multiple echogenic excrescences, largest measuring 8 x 5 mm.  All avascular in appearance. No pelvic free fluid.  Bilateral adnexa without any other masses.   ASSESSMENT:  69 y.o. G1P0010 here for a pelvic ultrasound and left ovarian cyst  PLAN:  We reviewed the ultrasound findings.  Potentially concerning increases suspicion for possibility of something other than a completely benign cyst with the finding of the excrescences, however given her normal tumor markers and relative avascularity of the excrescences, I think it is fine to proceed with the excision as planned.  We did review the possibilities, permutations, risks of the procedure and expected postoperative recovery as outlined in the last note from 12/13/2019.  The patient acknowledges understanding and agrees to proceed with surgery as planned.  Joseph Pierini MD 12/27/19

## 2020-01-09 DIAGNOSIS — D271 Benign neoplasm of left ovary: Secondary | ICD-10-CM

## 2020-01-09 HISTORY — DX: Benign neoplasm of left ovary: D27.1

## 2020-01-09 NOTE — Progress Notes (Signed)
Your procedure is scheduled on Friday, January 20, 2020.  Report to [facility] Main Entrance "A" at 5:30 A.M., and check in at the Admitting office.  Call this number if you have problems the morning of surgery:  [phone]  Call [phone] if you have any questions prior to your surgery date Monday-Friday 8am-4pm    Remember:  Do not eat after midnight the night before your surgery  You may drink clear liquids until 4:30 AM the morning of your surgery.   Clear liquids allowed are: Water, Non-Citrus Juices (without pulp), Carbonated Beverages, Clear Tea, Black Coffee Only, and Gatorade    Take these medicines the morning of surgery with A SIP OF WATER:  estradiol (ESTRACE) tablet loratadine (CLARITIN) omeprazole (PRILOSEC)  IF NEEDED: acetaminophen (TYLENOL) ALPRAZolam (XANAX) EPINEPHrine mometasone (NASONEX) traMADol (ULTRAM)   As of today, STOP taking any Aspirin (unless otherwise instructed by your surgeon) Aleve, Naproxen, Ibuprofen, Motrin, Advil, Goody's, BC's, all herbal medications, fish oil, and all vitamins.                      Do not wear jewelry, make up, or nail polish            Do not wear lotions, powders, perfumes, or deodorant.            Do not shave 48 hours prior to surgery.            Do not bring valuables to the hospital.            [facility] is not responsible for any belongings or valuables.  Do NOT Smoke (Tobacco/Vaping) or drink Alcohol 24 hours prior to your procedure If you use a CPAP at night, you may bring all equipment for your overnight stay.   Contacts, glasses, dentures or bridgework may not be worn into surgery.      For patients admitted to the hospital, discharge time will be determined by your treatment team.   Patients discharged the day of surgery will not be allowed to drive home, and someone needs to stay with them for 24 hours.    Special instructions:   [facility]- Preparing For Surgery  Before surgery, you can  play an important role. Because skin is not sterile, your skin needs to be as free of germs as possible. You can reduce the number of germs on your skin by washing with CHG (chlorahexidine gluconate) Soap before surgery.  CHG is an antiseptic cleaner which kills germs and bonds with the skin to continue killing germs even after washing.    Oral Hygiene is also important to reduce your risk of infection.  Remember - BRUSH YOUR TEETH THE MORNING OF SURGERY WITH YOUR REGULAR TOOTHPASTE  Please do not use if you have an allergy to CHG or antibacterial soaps. If your skin becomes reddened/irritated stop using the CHG.  Do not shave (including legs and underarms) for at least 48 hours prior to first CHG shower. It is OK to shave your face.  Please follow these instructions carefully.   1. Shower the NIGHT BEFORE SURGERY and the MORNING OF SURGERY with CHG Soap.   2. If you chose to wash your hair, wash your hair first as usual with your normal shampoo.  3. After you shampoo, rinse your hair and body thoroughly to remove the shampoo.  4. Use CHG as you would any other liquid soap. You can apply CHG directly to the skin and wash gently with  a scrungie or a clean washcloth.   5. Apply the CHG Soap to your body ONLY FROM THE NECK DOWN.  Do not use on open wounds or open sores. Avoid contact with your eyes, ears, mouth and genitals (private parts). Wash Face and genitals (private parts)  with your normal soap.   6. Wash thoroughly, paying special attention to the area where your surgery will be performed.  7. Thoroughly rinse your body with warm water from the neck down.  8. DO NOT shower/wash with your normal soap after using and rinsing off the CHG Soap.  9. Pat yourself dry with a CLEAN TOWEL.  10. Wear CLEAN PAJAMAS to bed the night before surgery  11. Place CLEAN SHEETS on your bed the night of your first shower and DO NOT SLEEP WITH PETS.   Day of Surgery: Wear Clean/Comfortable clothing  the morning of surgery Do not apply any deodorants/lotions.   Remember to brush your teeth WITH YOUR REGULAR TOOTHPASTE.   Please read over the following fact sheets that you were given.

## 2020-01-10 ENCOUNTER — Other Ambulatory Visit: Payer: Self-pay

## 2020-01-10 ENCOUNTER — Encounter (HOSPITAL_COMMUNITY)
Admission: RE | Admit: 2020-01-10 | Discharge: 2020-01-10 | Disposition: A | Discharge status: Home / Self Care | Payer: Medicare Other | Source: Ambulatory Visit | Attending: Obstetrics and Gynecology | Admitting: Obstetrics and Gynecology

## 2020-01-10 ENCOUNTER — Encounter (HOSPITAL_COMMUNITY): Payer: Self-pay

## 2020-01-10 DIAGNOSIS — I493 Ventricular premature depolarization: Secondary | ICD-10-CM | POA: Diagnosis not present

## 2020-01-10 DIAGNOSIS — I10 Essential (primary) hypertension: Secondary | ICD-10-CM | POA: Insufficient documentation

## 2020-01-10 DIAGNOSIS — Z01818 Encounter for other preprocedural examination: Secondary | ICD-10-CM | POA: Diagnosis not present

## 2020-01-10 DIAGNOSIS — K8020 Calculus of gallbladder without cholecystitis without obstruction: Secondary | ICD-10-CM | POA: Insufficient documentation

## 2020-01-10 HISTORY — DX: Nausea with vomiting, unspecified: R11.2

## 2020-01-10 HISTORY — DX: Other specified postprocedural states: Z98.890

## 2020-01-10 LAB — TYPE AND SCREEN
ABO/RH(D): A POS
Antibody Screen: NEGATIVE

## 2020-01-10 LAB — BASIC METABOLIC PANEL
Anion gap: 8 (ref 5–15)
BUN: 20 mg/dL (ref 8–23)
CO2: 27 mmol/L (ref 22–32)
Calcium: 9.5 mg/dL (ref 8.9–10.3)
Chloride: 104 mmol/L (ref 98–111)
Creatinine, Ser: 0.94 mg/dL (ref 0.44–1.00)
GFR, Estimated: >60 mL/min (ref >60)
Glucose, Bld: 95 mg/dL (ref 70–99)
Potassium: 4.1 mmol/L (ref 3.5–5.1)
Sodium: 139 mmol/L (ref 135–145)

## 2020-01-10 LAB — CBC
HCT: 39.8 % (ref 36.0–46.0)
Hemoglobin: 13 g/dL (ref 12.0–15.0)
MCH: 32.1 pg (ref 26.0–34.0)
MCHC: 32.7 g/dL (ref 30.0–36.0)
MCV: 98.3 fL (ref 80.0–100.0)
Platelets: 277 10*3/uL (ref 150–400)
RBC: 4.05 MIL/uL (ref 3.87–5.11)
RDW: 12.9 % (ref 11.5–15.5)
WBC: 7.1 10*3/uL (ref 4.0–10.5)
nRBC: 0 % (ref 0.0–0.2)

## 2020-01-10 NOTE — Progress Notes (Addendum)
PCP - Dr. Aura Dials Cardiologist - Dr. Kela Millin  PPM/ICD - denies  Chest x-ray - N/A EKG - 01/10/2020 Stress Test - per patient about 2-3 years ago, no abmormal results - requested Cardiac Cath - denies ECHO - per patient about 2-3 years ago, no abnormal results - requested  Sleep Study - denies CPAP - N/A  DM: denies  Blood Thinner Instructions: N/A Aspirin Instructions: N/A  ERAS Protcol - Yes PRE-SURGERY Ensure or G2- None ordered   COVID TEST- Scheduled for 01/17/2020. Patient verbalized understanding of self-quarantine instructions, appointment time and place.  Anesthesia review: YES, records requested Dr. Irven Shelling office called and asked to have patient cardiac records faxed.  Patient denies shortness of breath, fever, cough and chest pain at PAT appointment  All instructions explained to the patient, with a verbal understanding of the material. Patient agrees to go over the instructions while at home for a better understanding. Patient also instructed to self quarantine after being tested for COVID-19. The opportunity to ask questions was provided.

## 2020-01-12 LAB — HEPATIC FUNCTION PANEL
ALT: 107 U/L — ABNORMAL HIGH (ref 0–44)
AST: 43 U/L — ABNORMAL HIGH (ref 15–41)
Albumin: 3.9 g/dL (ref 3.5–5.0)
Alkaline Phosphatase: 177 U/L — ABNORMAL HIGH (ref 38–126)
Bilirubin, Direct: <0.1 mg/dL (ref 0.0–0.2)
Total Bilirubin: 0.4 mg/dL (ref 0.3–1.2)
Total Protein: 6.5 g/dL (ref 6.5–8.1)

## 2020-01-12 NOTE — Anesthesia Preprocedure Evaluation (Addendum)
Anesthesia Evaluation  Patient identified by MRN, date of birth, ID band Patient awake    Reviewed: Allergy & Precautions, NPO status , Patient's Chart, lab work & pertinent test results  History of Anesthesia Complications (+) PONVNegative for: history of anesthetic complications  Airway Mallampati: I  TM Distance: >3 FB Neck ROM: Full    Dental  (+) Teeth Intact, Dental Advisory Given   Pulmonary neg pulmonary ROS,    Pulmonary exam normal        Cardiovascular hypertension, Normal cardiovascular exam+ dysrhythmias      Neuro/Psych negative neurological ROS  negative psych ROS   GI/Hepatic Neg liver ROS, PUD,   Endo/Other  negative endocrine ROS  Renal/GU negative Renal ROS  negative genitourinary   Musculoskeletal negative musculoskeletal ROS (+)   Abdominal   Peds  Hematology negative hematology ROS (+)   Anesthesia Other Findings   Reproductive/Obstetrics Ovarian cyst                          Anesthesia Physical Anesthesia Plan  ASA: II  Anesthesia Plan: General   Post-op Pain Management:    Induction: Intravenous  PONV Risk Score and Plan: 4 or greater and Ondansetron, Dexamethasone, Treatment may vary due to age or medical condition, Midazolam and Scopolamine patch - Pre-op  Airway Management Planned: Oral ETT  Additional Equipment: None  Intra-op Plan:   Post-operative Plan: Extubation in OR  Informed Consent: I have reviewed the patients History and Physical, chart, labs and discussed the procedure including the risks, benefits and alternatives for the proposed anesthesia with the patient or authorized representative who has indicated his/her understanding and acceptance.     Dental advisory given  Plan Discussed with:   Anesthesia Plan Comments: (PAT note by Karoline Caldwell, PA-C: Patient was previously evaluated by cardiology for paroxysmal atrial tachycardia.  In  2015 she wore an event monitor which showed predominant sinus rhythm, symptomatic brief run of atrial tachycardia, 6-7 beats.  No sustained SVT or A. fib/flutter.  Occasional PACs and PVCs.  She had an echo also in 2015 that showed normal LV function, normal valves.  She was last seen by cardiology 07/17/2015, and at that time it was noted that since beginning diltiazem a nightly basis, her symptoms have mostly resolved.  She was advised to continue to follow with PCP for management and follow-up with cardiology as needed.  Transaminases and alk phos noted to be elevated recently.  Alk phos 373, AST 57 and ALT 149 when seen in the ED on 09/18/2019 for abdominal pain.  Felt related to gallbladder calculus.  Preop labs show hepatic function panel has improved somewhat, alk phos 177, ALT 107 and AST 43.  Remainder of labs unremarkable.  EKG 01/10/2020: NSR.  Rate 60.  Event monitor 12/13/2013 (copy on chart): Predominant rhythm is normal sinus rhythm. Reported events.  Brief run of atrial tachycardia, 6 beats to 7 beats.  No sustained SVT or AV fib/flutter.  Occasional PACs and PVCs.  TTE 01/10/2014 (copy in chart): Left ventricle cavity is normal in size.  Normal global wall motion.  Normal diastolic filling pattern.  Calculated EF 58%.  Trace mitral regurgitation.  Normal echocardiogram. )      Anesthesia Quick Evaluation

## 2020-01-12 NOTE — Progress Notes (Signed)
Anesthesia Chart Review:  Patient was previously evaluated by cardiology for paroxysmal atrial tachycardia.  In 2015 she wore an event monitor which showed predominant sinus rhythm, symptomatic brief run of atrial tachycardia, 6-7 beats.  No sustained SVT or A. fib/flutter.  Occasional PACs and PVCs.  She had an echo also in 2015 that showed normal LV function, normal valves.  She was last seen by cardiology 07/17/2015, and at that time it was noted that since beginning diltiazem a nightly basis, her symptoms have mostly resolved.  She was advised to continue to follow with PCP for management and follow-up with cardiology as needed.  Transaminases and alk phos noted to be elevated recently.  Alk phos 373, AST 57 and ALT 149 when seen in the ED on 09/18/2019 for abdominal pain.  Felt related to gallbladder calculus.  Preop labs show hepatic function panel has improved somewhat, alk phos 177, ALT 107 and AST 43.  Remainder of labs unremarkable.  EKG 01/10/2020: NSR.  Rate 60.  Event monitor 12/13/2013 (copy on chart): Predominant rhythm is normal sinus rhythm. Reported events.  Brief run of atrial tachycardia, 6 beats to 7 beats.  No sustained SVT or AV fib/flutter.  Occasional PACs and PVCs.  TTE 01/10/2014 (copy in chart): Left ventricle cavity is normal in size.  Normal global wall motion.  Normal diastolic filling pattern.  Calculated EF 58%.  Trace mitral regurgitation.  Normal echocardiogram.   Wynonia Musty [facility] Short Stay Center/Anesthesiology Phone [phone] 01/12/2020 3:11 PM

## 2020-01-17 ENCOUNTER — Other Ambulatory Visit (HOSPITAL_COMMUNITY)
Admission: RE | Admit: 2020-01-17 | Discharge: 2020-01-17 | Disposition: A | Discharge status: Home / Self Care | Payer: Medicare Other | Source: Ambulatory Visit | Attending: Obstetrics and Gynecology | Admitting: Obstetrics and Gynecology

## 2020-01-17 DIAGNOSIS — Z01818 Encounter for other preprocedural examination: Secondary | ICD-10-CM | POA: Diagnosis not present

## 2020-01-17 DIAGNOSIS — Z20822 Contact with and (suspected) exposure to covid-19: Secondary | ICD-10-CM | POA: Diagnosis not present

## 2020-01-17 LAB — SARS CORONAVIRUS 2 (TAT 6-24 HRS): SARS Coronavirus 2: NEGATIVE

## 2020-01-20 ENCOUNTER — Ambulatory Visit (HOSPITAL_COMMUNITY): Payer: Medicare Other

## 2020-01-20 ENCOUNTER — Ambulatory Visit (HOSPITAL_COMMUNITY): Payer: Medicare Other | Admitting: Vascular Surgery

## 2020-01-20 ENCOUNTER — Inpatient Hospital Stay (HOSPITAL_COMMUNITY)
Admission: RE | Admit: 2020-01-20 | Discharge: 2020-01-26 | DRG: 418 | Disposition: A | Discharge status: Home / Self Care | Payer: Medicare Other | Attending: General Surgery | Admitting: General Surgery

## 2020-01-20 ENCOUNTER — Encounter (HOSPITAL_COMMUNITY): Payer: Self-pay | Admitting: Obstetrics and Gynecology

## 2020-01-20 ENCOUNTER — Other Ambulatory Visit: Payer: Self-pay

## 2020-01-20 ENCOUNTER — Encounter (HOSPITAL_COMMUNITY)
Admission: RE | Disposition: A | Discharge status: Home / Self Care | Payer: Self-pay | Source: Home / Self Care | Attending: General Surgery

## 2020-01-20 ENCOUNTER — Ambulatory Visit (HOSPITAL_COMMUNITY): Payer: Medicare Other | Admitting: Physician Assistant

## 2020-01-20 DIAGNOSIS — Z79890 Hormone replacement therapy (postmenopausal): Secondary | ICD-10-CM

## 2020-01-20 DIAGNOSIS — K279 Peptic ulcer, site unspecified, unspecified as acute or chronic, without hemorrhage or perforation: Secondary | ICD-10-CM | POA: Diagnosis present

## 2020-01-20 DIAGNOSIS — J9811 Atelectasis: Secondary | ICD-10-CM | POA: Diagnosis not present

## 2020-01-20 DIAGNOSIS — Z79899 Other long term (current) drug therapy: Secondary | ICD-10-CM

## 2020-01-20 DIAGNOSIS — R932 Abnormal findings on diagnostic imaging of liver and biliary tract: Secondary | ICD-10-CM | POA: Diagnosis present

## 2020-01-20 DIAGNOSIS — T8189XA Other complications of procedures, not elsewhere classified, initial encounter: Secondary | ICD-10-CM | POA: Diagnosis not present

## 2020-01-20 DIAGNOSIS — K8050 Calculus of bile duct without cholangitis or cholecystitis without obstruction: Secondary | ICD-10-CM

## 2020-01-20 DIAGNOSIS — E440 Moderate protein-calorie malnutrition: Secondary | ICD-10-CM | POA: Insufficient documentation

## 2020-01-20 DIAGNOSIS — N62 Hypertrophy of breast: Secondary | ICD-10-CM | POA: Diagnosis present

## 2020-01-20 DIAGNOSIS — G8918 Other acute postprocedural pain: Secondary | ICD-10-CM | POA: Diagnosis not present

## 2020-01-20 DIAGNOSIS — K8070 Calculus of gallbladder and bile duct without cholecystitis without obstruction: Principal | ICD-10-CM | POA: Diagnosis present

## 2020-01-20 DIAGNOSIS — N83202 Unspecified ovarian cyst, left side: Secondary | ICD-10-CM | POA: Diagnosis not present

## 2020-01-20 DIAGNOSIS — Z880 Allergy status to penicillin: Secondary | ICD-10-CM

## 2020-01-20 DIAGNOSIS — K838 Other specified diseases of biliary tract: Secondary | ICD-10-CM | POA: Diagnosis not present

## 2020-01-20 DIAGNOSIS — R109 Unspecified abdominal pain: Secondary | ICD-10-CM | POA: Diagnosis not present

## 2020-01-20 DIAGNOSIS — Z803 Family history of malignant neoplasm of breast: Secondary | ICD-10-CM | POA: Diagnosis not present

## 2020-01-20 DIAGNOSIS — K6811 Postprocedural retroperitoneal abscess: Secondary | ICD-10-CM | POA: Diagnosis not present

## 2020-01-20 DIAGNOSIS — N809 Endometriosis, unspecified: Secondary | ICD-10-CM | POA: Diagnosis not present

## 2020-01-20 DIAGNOSIS — Z8249 Family history of ischemic heart disease and other diseases of the circulatory system: Secondary | ICD-10-CM | POA: Diagnosis not present

## 2020-01-20 DIAGNOSIS — Z9049 Acquired absence of other specified parts of digestive tract: Secondary | ICD-10-CM | POA: Diagnosis not present

## 2020-01-20 DIAGNOSIS — K660 Peritoneal adhesions (postprocedural) (postinfection): Secondary | ICD-10-CM | POA: Diagnosis present

## 2020-01-20 DIAGNOSIS — Z888 Allergy status to other drugs, medicaments and biological substances status: Secondary | ICD-10-CM | POA: Diagnosis not present

## 2020-01-20 DIAGNOSIS — Z978 Presence of other specified devices: Secondary | ICD-10-CM | POA: Diagnosis not present

## 2020-01-20 DIAGNOSIS — K8010 Calculus of gallbladder with chronic cholecystitis without obstruction: Secondary | ICD-10-CM | POA: Diagnosis not present

## 2020-01-20 DIAGNOSIS — I10 Essential (primary) hypertension: Secondary | ICD-10-CM | POA: Diagnosis not present

## 2020-01-20 DIAGNOSIS — Z90710 Acquired absence of both cervix and uterus: Secondary | ICD-10-CM

## 2020-01-20 DIAGNOSIS — Z8719 Personal history of other diseases of the digestive system: Secondary | ICD-10-CM | POA: Diagnosis not present

## 2020-01-20 DIAGNOSIS — Z6823 Body mass index (BMI) 23.0-23.9, adult: Secondary | ICD-10-CM | POA: Diagnosis not present

## 2020-01-20 DIAGNOSIS — K668 Other specified disorders of peritoneum: Secondary | ICD-10-CM | POA: Diagnosis not present

## 2020-01-20 DIAGNOSIS — Z808 Family history of malignant neoplasm of other organs or systems: Secondary | ICD-10-CM | POA: Diagnosis not present

## 2020-01-20 DIAGNOSIS — N736 Female pelvic peritoneal adhesions (postinfective): Secondary | ICD-10-CM | POA: Diagnosis not present

## 2020-01-20 DIAGNOSIS — Z885 Allergy status to narcotic agent status: Secondary | ICD-10-CM

## 2020-01-20 DIAGNOSIS — K9189 Other postprocedural complications and disorders of digestive system: Secondary | ICD-10-CM | POA: Diagnosis not present

## 2020-01-20 DIAGNOSIS — R748 Abnormal levels of other serum enzymes: Secondary | ICD-10-CM | POA: Diagnosis not present

## 2020-01-20 DIAGNOSIS — T8143XA Infection following a procedure, organ and space surgical site, initial encounter: Secondary | ICD-10-CM | POA: Diagnosis not present

## 2020-01-20 DIAGNOSIS — R5082 Postprocedural fever: Secondary | ICD-10-CM | POA: Diagnosis not present

## 2020-01-20 DIAGNOSIS — Z823 Family history of stroke: Secondary | ICD-10-CM | POA: Diagnosis not present

## 2020-01-20 DIAGNOSIS — D271 Benign neoplasm of left ovary: Secondary | ICD-10-CM | POA: Diagnosis not present

## 2020-01-20 DIAGNOSIS — N838 Other noninflammatory disorders of ovary, fallopian tube and broad ligament: Secondary | ICD-10-CM | POA: Diagnosis not present

## 2020-01-20 DIAGNOSIS — K8020 Calculus of gallbladder without cholecystitis without obstruction: Secondary | ICD-10-CM | POA: Diagnosis not present

## 2020-01-20 HISTORY — PX: CHOLECYSTECTOMY: SHX55

## 2020-01-20 HISTORY — PX: LAPAROSCOPIC BILATERAL SALPINGO OOPHERECTOMY: SHX5890

## 2020-01-20 LAB — CBC WITH DIFFERENTIAL/PLATELET
Abs Immature Granulocytes: 0.05 10*3/uL (ref 0.00–0.07)
Basophils Absolute: 0 10*3/uL (ref 0.0–0.1)
Basophils Relative: 0 %
Eosinophils Absolute: 0 10*3/uL (ref 0.0–0.5)
Eosinophils Relative: 0 %
HCT: 37.6 % (ref 36.0–46.0)
Hemoglobin: 12.6 g/dL (ref 12.0–15.0)
Immature Granulocytes: 0 %
Lymphocytes Relative: 5 %
Lymphs Abs: 0.6 10*3/uL — ABNORMAL LOW (ref 0.7–4.0)
MCH: 31.6 pg (ref 26.0–34.0)
MCHC: 33.5 g/dL (ref 30.0–36.0)
MCV: 94.2 fL (ref 80.0–100.0)
Monocytes Absolute: 0.3 10*3/uL (ref 0.1–1.0)
Monocytes Relative: 3 %
Neutro Abs: 11.4 10*3/uL — ABNORMAL HIGH (ref 1.7–7.7)
Neutrophils Relative %: 92 %
Platelets: 298 10*3/uL (ref 150–400)
RBC: 3.99 MIL/uL (ref 3.87–5.11)
RDW: 12.4 % (ref 11.5–15.5)
WBC: 12.5 10*3/uL — ABNORMAL HIGH (ref 4.0–10.5)
nRBC: 0 % (ref 0.0–0.2)

## 2020-01-20 LAB — COMPREHENSIVE METABOLIC PANEL
ALT: 65 U/L — ABNORMAL HIGH (ref 0–44)
AST: 47 U/L — ABNORMAL HIGH (ref 15–41)
Albumin: 3.4 g/dL — ABNORMAL LOW (ref 3.5–5.0)
Alkaline Phosphatase: 229 U/L — ABNORMAL HIGH (ref 38–126)
Anion gap: 11 (ref 5–15)
BUN: 20 mg/dL (ref 8–23)
CO2: 26 mmol/L (ref 22–32)
Calcium: 9.2 mg/dL (ref 8.9–10.3)
Chloride: 101 mmol/L (ref 98–111)
Creatinine, Ser: 0.95 mg/dL (ref 0.44–1.00)
GFR, Estimated: >60 mL/min (ref >60)
Glucose, Bld: 129 mg/dL — ABNORMAL HIGH (ref 70–99)
Potassium: 4.1 mmol/L (ref 3.5–5.1)
Sodium: 138 mmol/L (ref 135–145)
Total Bilirubin: 0.9 mg/dL (ref 0.3–1.2)
Total Protein: 5.9 g/dL — ABNORMAL LOW (ref 6.5–8.1)

## 2020-01-20 LAB — ABO/RH: ABO/RH(D): A POS

## 2020-01-20 SURGERY — SALPINGO-OOPHORECTOMY, BILATERAL, LAPAROSCOPIC
Anesthesia: General

## 2020-01-20 SURGERY — LAPAROSCOPIC CHOLECYSTECTOMY WITH INTRAOPERATIVE CHOLANGIOGRAM
Anesthesia: General

## 2020-01-20 MED ORDER — FENTANYL CITRATE (PF) 100 MCG/2ML IJ SOLN
INTRAMUSCULAR | Status: AC
  Filled 2020-01-20: qty 2

## 2020-01-20 MED ORDER — SODIUM CHLORIDE 0.9 % IV SOLN
INTRAVENOUS | Status: DC | PRN
  Administered 2020-01-20: 50 mL

## 2020-01-20 MED ORDER — LACTATED RINGERS IV SOLN: INTRAVENOUS | Status: DC

## 2020-01-20 MED ORDER — ONDANSETRON 4 MG PO TBDP: 4.0000 mg | ORAL_TABLET | Freq: Four times a day (QID) | ORAL | Status: DC | PRN

## 2020-01-20 MED ORDER — DEXAMETHASONE SODIUM PHOSPHATE 10 MG/ML IJ SOLN
INTRAMUSCULAR | Status: DC | PRN
  Administered 2020-01-20: 8 mg via INTRAVENOUS

## 2020-01-20 MED ORDER — PHENYLEPHRINE 40 MCG/ML (10ML) SYRINGE FOR IV PUSH (FOR BLOOD PRESSURE SUPPORT)
PREFILLED_SYRINGE | INTRAVENOUS | Status: DC | PRN
  Administered 2020-01-20 (×2): 80 ug via INTRAVENOUS
  Administered 2020-01-20: 120 ug via INTRAVENOUS

## 2020-01-20 MED ORDER — LIDOCAINE 2% (20 MG/ML) 5 ML SYRINGE
INTRAMUSCULAR | Status: DC | PRN
  Administered 2020-01-20: 80 mg via INTRAVENOUS

## 2020-01-20 MED ORDER — SCOPOLAMINE 1 MG/3DAYS TD PT72
MEDICATED_PATCH | TRANSDERMAL | Status: DC | PRN
  Administered 2020-01-20: 1 via TRANSDERMAL

## 2020-01-20 MED ORDER — KCL IN DEXTROSE-NACL 20-5-0.9 MEQ/L-%-% IV SOLN
INTRAVENOUS | Status: DC
  Filled 2020-01-20 (×7): qty 1000

## 2020-01-20 MED ORDER — CHLORHEXIDINE GLUCONATE 0.12 % MT SOLN
15.0000 mL | Freq: Once | OROMUCOSAL | Status: AC
  Administered 2020-01-20: 15 mL via OROMUCOSAL
  Filled 2020-01-20: qty 15

## 2020-01-20 MED ORDER — DEXAMETHASONE SODIUM PHOSPHATE 10 MG/ML IJ SOLN
INTRAMUSCULAR | Status: AC
  Filled 2020-01-20: qty 1

## 2020-01-20 MED ORDER — BUPIVACAINE HCL (PF) 0.25 % IJ SOLN
INTRAMUSCULAR | Status: AC
  Filled 2020-01-20: qty 30

## 2020-01-20 MED ORDER — PROPOFOL 10 MG/ML IV BOLUS
INTRAVENOUS | Status: AC
  Filled 2020-01-20: qty 40

## 2020-01-20 MED ORDER — ONDANSETRON HCL 4 MG/2ML IJ SOLN
INTRAMUSCULAR | Status: DC | PRN
  Administered 2020-01-20: 4 mg via INTRAVENOUS

## 2020-01-20 MED ORDER — OXYCODONE HCL 5 MG PO TABS: 5.0000 mg | ORAL_TABLET | Freq: Once | ORAL | Status: DC | PRN

## 2020-01-20 MED ORDER — PROPOFOL 10 MG/ML IV BOLUS
INTRAVENOUS | Status: DC | PRN
  Administered 2020-01-20: 170 mg via INTRAVENOUS

## 2020-01-20 MED ORDER — EPHEDRINE SULFATE-NACL 50-0.9 MG/10ML-% IV SOSY
PREFILLED_SYRINGE | INTRAVENOUS | Status: DC | PRN
  Administered 2020-01-20: 10 mg via INTRAVENOUS

## 2020-01-20 MED ORDER — ACETAMINOPHEN 500 MG PO TABS
1000.0000 mg | ORAL_TABLET | ORAL | Status: AC
  Administered 2020-01-20: 1000 mg via ORAL
  Filled 2020-01-20: qty 2

## 2020-01-20 MED ORDER — SCOPOLAMINE 1 MG/3DAYS TD PT72
MEDICATED_PATCH | TRANSDERMAL | Status: AC
  Filled 2020-01-20: qty 1

## 2020-01-20 MED ORDER — ONDANSETRON HCL 4 MG/2ML IJ SOLN
INTRAMUSCULAR | Status: AC
  Filled 2020-01-20: qty 2

## 2020-01-20 MED ORDER — CHLORHEXIDINE GLUCONATE CLOTH 2 % EX PADS: 6.0000 | MEDICATED_PAD | Freq: Once | CUTANEOUS | Status: DC

## 2020-01-20 MED ORDER — ROCURONIUM BROMIDE 10 MG/ML (PF) SYRINGE
PREFILLED_SYRINGE | INTRAVENOUS | Status: DC | PRN
  Administered 2020-01-20: 20 mg via INTRAVENOUS
  Administered 2020-01-20: 60 mg via INTRAVENOUS
  Administered 2020-01-20: 40 mg via INTRAVENOUS

## 2020-01-20 MED ORDER — ROCURONIUM BROMIDE 10 MG/ML (PF) SYRINGE
PREFILLED_SYRINGE | INTRAVENOUS | Status: AC
  Filled 2020-01-20: qty 10

## 2020-01-20 MED ORDER — SUGAMMADEX SODIUM 200 MG/2ML IV SOLN
INTRAVENOUS | Status: DC | PRN
  Administered 2020-01-20: 200 mg via INTRAVENOUS

## 2020-01-20 MED ORDER — MIDAZOLAM HCL 2 MG/2ML IJ SOLN
INTRAMUSCULAR | Status: AC
  Filled 2020-01-20: qty 2

## 2020-01-20 MED ORDER — CIPROFLOXACIN IN D5W 400 MG/200ML IV SOLN
400.0000 mg | INTRAVENOUS | Status: AC
  Administered 2020-01-20: 400 mg via INTRAVENOUS
  Filled 2020-01-20: qty 200

## 2020-01-20 MED ORDER — OXYCODONE HCL 5 MG/5ML PO SOLN: 5.0000 mg | Freq: Once | ORAL | Status: DC | PRN

## 2020-01-20 MED ORDER — AMISULPRIDE (ANTIEMETIC) 5 MG/2ML IV SOLN: 10.0000 mg | Freq: Once | INTRAVENOUS | Status: DC | PRN

## 2020-01-20 MED ORDER — ORAL CARE MOUTH RINSE: 15.0000 mL | Freq: Once | OROMUCOSAL | Status: AC

## 2020-01-20 MED ORDER — ONDANSETRON HCL 4 MG/2ML IJ SOLN
4.0000 mg | Freq: Four times a day (QID) | INTRAMUSCULAR | Status: DC | PRN
  Administered 2020-01-21: 4 mg via INTRAVENOUS
  Filled 2020-01-20: qty 2

## 2020-01-20 MED ORDER — MIDAZOLAM HCL 5 MG/5ML IJ SOLN
INTRAMUSCULAR | Status: DC | PRN
  Administered 2020-01-20: 2 mg via INTRAVENOUS

## 2020-01-20 MED ORDER — OXYCODONE HCL 5 MG PO TABS
5.0000 mg | ORAL_TABLET | Freq: Four times a day (QID) | ORAL | 0 refills | Status: DC | PRN
Start: 2020-01-20 — End: 2020-09-11

## 2020-01-20 MED ORDER — MORPHINE SULFATE (PF) 2 MG/ML IV SOLN
1.0000 mg | INTRAVENOUS | Status: DC | PRN
  Administered 2020-01-20 – 2020-01-23 (×15): 2 mg via INTRAVENOUS
  Filled 2020-01-20 (×15): qty 1

## 2020-01-20 MED ORDER — GABAPENTIN 300 MG PO CAPS
300.0000 mg | ORAL_CAPSULE | ORAL | Status: AC
  Administered 2020-01-20: 300 mg via ORAL
  Filled 2020-01-20: qty 1

## 2020-01-20 MED ORDER — PANTOPRAZOLE SODIUM 40 MG IV SOLR
40.0000 mg | Freq: Every day | INTRAVENOUS | Status: DC
  Administered 2020-01-20 – 2020-01-22 (×3): 40 mg via INTRAVENOUS
  Filled 2020-01-20 (×3): qty 40

## 2020-01-20 MED ORDER — SODIUM CHLORIDE 0.9 % IR SOLN
Status: DC | PRN
  Administered 2020-01-20: 3000 mL

## 2020-01-20 MED ORDER — SUCCINYLCHOLINE CHLORIDE 200 MG/10ML IV SOSY
PREFILLED_SYRINGE | INTRAVENOUS | Status: AC
  Filled 2020-01-20: qty 10

## 2020-01-20 MED ORDER — LORAZEPAM 2 MG/ML IJ SOLN: 0.5000 mg | Freq: Four times a day (QID) | INTRAMUSCULAR | Status: DC | PRN

## 2020-01-20 MED ORDER — PHENYLEPHRINE 40 MCG/ML (10ML) SYRINGE FOR IV PUSH (FOR BLOOD PRESSURE SUPPORT)
PREFILLED_SYRINGE | INTRAVENOUS | Status: AC
  Filled 2020-01-20: qty 10

## 2020-01-20 MED ORDER — LIDOCAINE 2% (20 MG/ML) 5 ML SYRINGE
INTRAMUSCULAR | Status: AC
  Filled 2020-01-20: qty 5

## 2020-01-20 MED ORDER — POVIDONE-IODINE 10 % EX SWAB: 2.0000 "application " | Freq: Once | CUTANEOUS | Status: DC

## 2020-01-20 MED ORDER — EPHEDRINE 5 MG/ML INJ
INTRAVENOUS | Status: AC
  Filled 2020-01-20: qty 10

## 2020-01-20 MED ORDER — ONDANSETRON HCL 4 MG/2ML IJ SOLN: 4.0000 mg | Freq: Once | INTRAMUSCULAR | Status: DC | PRN

## 2020-01-20 MED ORDER — BUPIVACAINE HCL (PF) 0.25 % IJ SOLN
INTRAMUSCULAR | Status: DC | PRN
  Administered 2020-01-20: 11 mL
  Administered 2020-01-20: 15 mL

## 2020-01-20 MED ORDER — FENTANYL CITRATE (PF) 100 MCG/2ML IJ SOLN
INTRAMUSCULAR | Status: DC | PRN
  Administered 2020-01-20 (×3): 50 ug via INTRAVENOUS

## 2020-01-20 MED ORDER — FENTANYL CITRATE (PF) 100 MCG/2ML IJ SOLN
25.0000 ug | INTRAMUSCULAR | Status: DC | PRN
  Administered 2020-01-20 (×3): 50 ug via INTRAVENOUS

## 2020-01-20 MED ORDER — FENTANYL CITRATE (PF) 250 MCG/5ML IJ SOLN
INTRAMUSCULAR | Status: AC
  Filled 2020-01-20: qty 5

## 2020-01-20 SURGICAL SUPPLY — 73 items
ADH SKN CLS APL DERMABOND .7 (GAUZE/BANDAGES/DRESSINGS) ×4
ADH SKN CLS LQ APL DERMABOND (GAUZE/BANDAGES/DRESSINGS) ×2
APL PRP STRL LF DISP 70% ISPRP (MISCELLANEOUS) ×2
APL SRG 38 LTWT LNG FL B (MISCELLANEOUS)
APPLICATOR ARISTA FLEXITIP XL (MISCELLANEOUS) IMPLANT
APPLIER CLIP 5 13 M/L LIGAMAX5 (MISCELLANEOUS) ×6
APR CLP MED LRG 5 ANG JAW (MISCELLANEOUS) ×4
BAG RETRIEVAL 10 (BASKET) ×2
BAG SPEC RTRVL LRG 6X4 10 (ENDOMECHANICALS) ×2
BARRIER ADHS 3X4 INTERCEED (GAUZE/BANDAGES/DRESSINGS) IMPLANT
BLADE CLIPPER SURG (BLADE) IMPLANT
BRR ADH 4X3 ABS CNTRL BYND (GAUZE/BANDAGES/DRESSINGS)
CABLE HIGH FREQUENCY MONO STRZ (ELECTRODE) ×3 IMPLANT
CANISTER SUCT 3000ML PPV (MISCELLANEOUS) ×3 IMPLANT
CATH REDDICK CHOLANGI 4FR 50CM (CATHETERS) ×3 IMPLANT
CATH ROBINSON RED A/P 16FR (CATHETERS) IMPLANT
CHLORAPREP W/TINT 26 (MISCELLANEOUS) ×3 IMPLANT
CLIP APPLIE 5 13 M/L LIGAMAX5 (MISCELLANEOUS) ×4 IMPLANT
COVER MAYO STAND STRL (DRAPES) ×6 IMPLANT
COVER SURGICAL LIGHT HANDLE (MISCELLANEOUS) ×3 IMPLANT
COVER WAND RF STERILE (DRAPES) ×6 IMPLANT
DERMABOND ADHESIVE PROPEN (GAUZE/BANDAGES/DRESSINGS) ×1
DERMABOND ADVANCED (GAUZE/BANDAGES/DRESSINGS) ×2
DERMABOND ADVANCED .7 DNX12 (GAUZE/BANDAGES/DRESSINGS) ×4 IMPLANT
DERMABOND ADVANCED .7 DNX6 (GAUZE/BANDAGES/DRESSINGS) ×2 IMPLANT
DRAPE C-ARM 42X120 X-RAY (DRAPES) ×3 IMPLANT
DURAPREP 26ML APPLICATOR (WOUND CARE) ×3 IMPLANT
ELECT REM PT RETURN 9FT ADLT (ELECTROSURGICAL) ×3
ELECTRODE REM PT RTRN 9FT ADLT (ELECTROSURGICAL) ×2 IMPLANT
GAUZE 4X4 16PLY RFD (DISPOSABLE) ×3 IMPLANT
GLOVE BIO SURGEON STRL SZ7.5 (GLOVE) ×3 IMPLANT
GLOVE BIO SURGEON STRL SZ8 (GLOVE) IMPLANT
GLOVE BIOGEL PI IND STRL 7.0 (GLOVE) ×4 IMPLANT
GLOVE BIOGEL PI IND STRL 8 (GLOVE) ×2 IMPLANT
GLOVE BIOGEL PI INDICATOR 7.0 (GLOVE) ×2
GLOVE BIOGEL PI INDICATOR 8 (GLOVE) ×1
GOWN STRL REUS W/ TWL LRG LVL3 (GOWN DISPOSABLE) ×6 IMPLANT
GOWN STRL REUS W/ TWL XL LVL3 (GOWN DISPOSABLE) ×2 IMPLANT
GOWN STRL REUS W/TWL LRG LVL3 (GOWN DISPOSABLE) ×12 IMPLANT
GOWN STRL REUS W/TWL XL LVL3 (GOWN DISPOSABLE) ×3
HEMOSTAT ARISTA ABSORB 3G PWDR (HEMOSTASIS) IMPLANT
IV CATH 14GX2 1/4 (CATHETERS) ×3 IMPLANT
KIT BASIN OR (CUSTOM PROCEDURE TRAY) IMPLANT
KIT TURNOVER KIT B (KITS) ×3 IMPLANT
LIGASURE BLUNT TIP 5 LONG 44CM (ELECTROSURGICAL) ×3 IMPLANT
NS IRRIG 1000ML POUR BTL (IV SOLUTION) ×3 IMPLANT
PACK LAPAROSCOPY BASIN (CUSTOM PROCEDURE TRAY) ×3 IMPLANT
PACK TRENDGUARD 450 HYBRID PRO (MISCELLANEOUS) ×2 IMPLANT
PAD ARMBOARD 7.5X6 YLW CONV (MISCELLANEOUS) ×3 IMPLANT
PAD OB MATERNITY 4.3X12.25 (PERSONAL CARE ITEMS) ×3 IMPLANT
POUCH SPECIMEN RETRIEVAL 10MM (ENDOMECHANICALS) ×3 IMPLANT
PROTECTOR NERVE ULNAR (MISCELLANEOUS) ×6 IMPLANT
SCISSORS LAP 5X35 DISP (ENDOMECHANICALS) ×6 IMPLANT
SET CHOLANGIOGRAPH 5 50 .035 (SET/KITS/TRAYS/PACK) ×3 IMPLANT
SET IRRIG TUBING LAPAROSCOPIC (IRRIGATION / IRRIGATOR) ×6 IMPLANT
SET TUBE SMOKE EVAC HIGH FLOW (TUBING) ×6 IMPLANT
SLEEVE ENDOPATH XCEL 5M (ENDOMECHANICALS) ×6 IMPLANT
SPECIMEN JAR SMALL (MISCELLANEOUS) ×3 IMPLANT
SUT MNCRL AB 4-0 PS2 18 (SUTURE) ×9 IMPLANT
SUT VICRYL 0 UR6 27IN ABS (SUTURE) IMPLANT
SYS BAG RETRIEVAL 10MM (BASKET) ×4
SYSTEM BAG RETRIEVAL 10MM (BASKET) ×4 IMPLANT
TOWEL GREEN STERILE (TOWEL DISPOSABLE) ×3 IMPLANT
TOWEL GREEN STERILE FF (TOWEL DISPOSABLE) ×6 IMPLANT
TRAY FOLEY W/BAG SLVR 14FR (SET/KITS/TRAYS/PACK) ×3 IMPLANT
TRAY LAPAROSCOPIC MC (CUSTOM PROCEDURE TRAY) ×3 IMPLANT
TRENDGUARD 450 HYBRID PRO PACK (MISCELLANEOUS) ×3
TROCAR BALLN 12MMX100 BLUNT (TROCAR) IMPLANT
TROCAR XCEL BLUNT TIP 100MML (ENDOMECHANICALS) ×3 IMPLANT
TROCAR XCEL NON-BLD 11X100MML (ENDOMECHANICALS) ×3 IMPLANT
TROCAR XCEL NON-BLD 5MMX100MML (ENDOMECHANICALS) ×12 IMPLANT
WARMER LAPAROSCOPE (MISCELLANEOUS) ×3 IMPLANT
WATER STERILE IRR 1000ML POUR (IV SOLUTION) ×3 IMPLANT

## 2020-01-20 NOTE — Anesthesia Postprocedure Evaluation (Signed)
Anesthesia Post Note  Patient: Pamela Whitehead  Procedure(s) Performed: LAPAROSCOPIC BILATERAL SALPINGO OOPHORECTOMY (Bilateral ) LAPAROSCOPIC CHOLECYSTECTOMY WITH INTRAOPERATIVE CHOLANGIOGRAM (N/A )     Patient location during evaluation: PACU Anesthesia Type: General Level of consciousness: awake and alert Pain management: pain level controlled Vital Signs Assessment: post-procedure vital signs reviewed and stable Respiratory status: spontaneous breathing, nonlabored ventilation, respiratory function stable and patient connected to nasal cannula oxygen Cardiovascular status: blood pressure returned to baseline and stable Postop Assessment: no apparent nausea or vomiting Anesthetic complications: no   No complications documented.  Last Vitals:  Vitals:   01/20/20 1300 01/20/20 1304  BP:  132/68  Pulse: (!) 59 (!) 55  Resp: 14 13  Temp:    SpO2: 99% 100%    Last Pain:  Vitals:   01/20/20 1300  TempSrc:   PainSc: 0-No pain                 Tiajuana Amass

## 2020-01-20 NOTE — Anesthesia Preprocedure Evaluation (Addendum)
Anesthesia Evaluation  Patient identified by MRN, date of birth, ID band Patient awake    Reviewed: Allergy & Precautions, NPO status , Patient's Chart, lab work & pertinent test results, reviewed documented beta blocker date and time   History of Anesthesia Complications (+) PONVNegative for: history of anesthetic complications  Airway Mallampati: II  TM Distance: >3 FB Neck ROM: Full    Dental no notable dental hx. (+) Dental Advisory Given, Teeth Intact   Pulmonary neg pulmonary ROS,    Pulmonary exam normal breath sounds clear to auscultation       Cardiovascular hypertension, Pt. on home beta blockers and Pt. on medications Normal cardiovascular exam+ dysrhythmias  Rhythm:Regular Rate:Normal     Neuro/Psych negative neurological ROS  negative psych ROS   GI/Hepatic Neg liver ROS, PUD,   Endo/Other  negative endocrine ROS  Renal/GU Renal disease  negative genitourinary   Musculoskeletal negative musculoskeletal ROS (+)   Abdominal   Peds  Hematology negative hematology ROS (+)   Anesthesia Other Findings   Reproductive/Obstetrics Ovarian cyst                            Anesthesia Physical  Anesthesia Plan  ASA: II  Anesthesia Plan: General   Post-op Pain Management:    Induction: Intravenous  PONV Risk Score and Plan: 4 or greater and Ondansetron, Dexamethasone, Treatment may vary due to age or medical condition and Midazolam  Airway Management Planned: Oral ETT  Additional Equipment: None  Intra-op Plan:   Post-operative Plan: Extubation in OR  Informed Consent: I have reviewed the patients History and Physical, chart, labs and discussed the procedure including the risks, benefits and alternatives for the proposed anesthesia with the patient or authorized representative who has indicated his/her understanding and acceptance.     Dental advisory given  Plan  Discussed with: CRNA  Anesthesia Plan Comments: (PAT note by Karoline Caldwell, PA-C: Patient was previously evaluated by cardiology for paroxysmal atrial tachycardia.  In 2015 she wore an event monitor which showed predominant sinus rhythm, symptomatic brief run of atrial tachycardia, 6-7 beats.  No sustained SVT or A. fib/flutter.  Occasional PACs and PVCs.  She had an echo also in 2015 that showed normal LV function, normal valves.  She was last seen by cardiology 07/17/2015, and at that time it was noted that since beginning diltiazem a nightly basis, her symptoms have mostly resolved.  She was advised to continue to follow with PCP for management and follow-up with cardiology as needed.  Transaminases and alk phos noted to be elevated recently.  Alk phos 373, AST 57 and ALT 149 when seen in the ED on 09/18/2019 for abdominal pain.  Felt related to gallbladder calculus.  Preop labs show hepatic function panel has improved somewhat, alk phos 177, ALT 107 and AST 43.  Remainder of labs unremarkable.  EKG 01/10/2020: NSR.  Rate 60.  Event monitor 12/13/2013 (copy on chart): Predominant rhythm is normal sinus rhythm. Reported events.  Brief run of atrial tachycardia, 6 beats to 7 beats.  No sustained SVT or AV fib/flutter.  Occasional PACs and PVCs.  TTE 01/10/2014 (copy in chart): Left ventricle cavity is normal in size.  Normal global wall motion.  Normal diastolic filling pattern.  Calculated EF 58%.  Trace mitral regurgitation.  Normal echocardiogram. )       Anesthesia Quick Evaluation

## 2020-01-20 NOTE — Op Note (Addendum)
01/20/2020  10:18 AM  PATIENT:  Pamela Whitehead  69 y.o. female  PRE-OPERATIVE DIAGNOSIS:  gallstones  POST-OPERATIVE DIAGNOSIS: gallstones  PROCEDURE:  Procedure(s) with comments: LAPAROSCOPIC CHOLECYSTECTOMY WITH INTRAOPERATIVE CHOLANGIOGRAM (N/A)  SURGEON:  Surgeon(s) and Role:  Jovita Kussmaul, MD - Primary  PHYSICIAN ASSISTANT:   ASSISTANTS: none   ANESTHESIA:   general  EBL:  10 mL   BLOOD ADMINISTERED:none  DRAINS: none   LOCAL MEDICATIONS USED:  MARCAINE     SPECIMEN:  Source of Specimen:  gallbladder  DISPOSITION OF SPECIMEN:  PATHOLOGY  COUNTS:  YES  TOURNIQUET:  * No tourniquets in log *  DICTATION: .Dragon Dictation     Procedure: After informed consent was obtained the patient was brought to the operating room and placed in the supine position on the operating room table. After adequate induction of general anesthesia the patient's abdomen was prepped with ChloraPrep allowed to dry and draped in usual sterile manner. An appropriate timeout was performed. The area below the umbilicus was infiltrated with quarter percent  Marcaine. A small incision was made with a 15 blade knife. The incision was carried down through the subcutaneous tissue bluntly with a hemostat and [employer] retractors. The linea alba was identified. The linea alba was incised with a 15 blade knife and each side was grasped with Coker clamps. The preperitoneal space was then probed with a hemostat until the peritoneum was opened and access was gained to the abdominal cavity. A 0 Vicryl pursestring stitch was placed in the fascia surrounding the opening. A Hassan cannula was then placed through the opening and anchored in place with the previously placed Vicryl purse string stitch. The abdomen was insufflated with carbon dioxide without difficulty. A laparoscope was inserted through the [facility] cannula in the right upper quadrant was inspected. Next the epigastric region was infiltrated with  % Marcaine. A small incision was made with a 15 blade knife. A 5 mm port was placed bluntly through this incision into the abdominal cavity under direct vision. Next 2 sites were chosen laterally on the right side of the abdomen for placement of 5 mm ports. Each of these areas was infiltrated with quarter percent Marcaine. Small stab incisions were made with a 15 blade knife. 5 mm ports were then placed bluntly through these incisions into the abdominal cavity under direct vision without difficulty. A blunt grasper was placed through the lateralmost 5 mm port and used to grasp the dome of the gallbladder and elevated anteriorly and superiorly. Another blunt grasper was placed through the other 5 mm port and used to retract the body and neck of the gallbladder. A dissector was placed through the epigastric port and using the electrocautery the peritoneal reflection at the gallbladder neck was opened. Blunt dissection was then carried out in this area until the gallbladder neck-cystic duct junction was readily identified and a good window was created. A single clip was placed on the gallbladder neck. A small  ductotomy was made just below the clip with laparoscopic scissors. A 14-gauge Angiocath was then placed through the anterior abdominal wall under direct vision. A Reddick cholangiogram catheter was then placed through the Angiocath and flushed. The catheter was then placed in the cystic duct and anchored in place with a clip. A cholangiogram was obtained that showed several medium sized filling defects but good emptying into the duodenum an adequate length on the cystic duct. The anchoring clip and catheters were then removed from the patient. 3 clips were  placed proximally on the cystic duct and the duct was divided between the 2 sets of clips. Posterior to this the cystic artery was identified and again dissected bluntly in a circumferential manner until a good window  was created. 2 clips were placed  proximally and one distally on the artery and the artery was divided between the 2 sets of clips. Next a laparoscopic hook cautery device was used to separate the gallbladder from the liver bed. Prior to completely detaching the gallbladder from the liver bed the liver bed was inspected and several small bleeding points were coagulated with the electrocautery until the area was completely hemostatic. The gallbladder was then detached the rest of it from the liver bed without difficulty. A laparoscopic bag was inserted through the hassan port. The laparoscope was moved to the epigastric port. The gallbladder was placed within the bag and the bag was sealed.  The bag with the gallbladder was then removed with the [facility] cannula through the infraumbilical port without difficulty. The fascial defect was then closed with the previously placed Vicryl pursestring stitch as well as with another figure-of-eight 0 Vicryl stitch. The liver bed was inspected again and found to be hemostatic. The abdomen was irrigated with copious amounts of saline until the effluent was clear. The ports were then removed under direct vision without difficulty and were found to be hemostatic. The gas was allowed to escape. The skin incisions were all closed with interrupted 4-0 Monocryl subcuticular stitches. Dermabond dressings were applied. The patient tolerated the procedure well. At the end of the case all needle sponge and instrument counts were correct. The patient was then awakened and taken to recovery in stable condition. I also closed the incisions from the GYN surgery  PLAN OF CARE: Admit for overnight observation  PATIENT DISPOSITION:  PACU - hemodynamically stable.   Delay start of Pharmacological VTE agent (>24hrs) due to surgical blood loss or risk of bleeding: no

## 2020-01-20 NOTE — Op Note (Signed)
Name: Pamela Whitehead  Age: 69 y.o.. Date of Birth: [birth year] Medical Record #: [id number]  Operative Note  Preoperative Diagnosis: Left ovarian cyst, desires prophylactic right salpingo-oophorectomy, postmenopausal Procedure: Operative laparoscopy, bilateral salpingo-oophorectomy, pelvic washings, lysis of adhesions.  Postoperative Diagnosis: same Surgeon: Joseph Pierini, MD Assistant: Princess Bruins, MD Estimated Blood Loss: 5 mL Specimens: Right ovary and fallopian tube, left ovary and cyst with fallopian tube, pelvic washings Findings: Surgically absent uterus, grossly normal appendix, sclerotic appearing liver.  Normal right fallopian tube and ovary.  Omental and bowel adhesions to the vaginal cuff and left pelvic sidewall.  Grossly simple appearing 6 cm left ovarian cyst with clear straw-colored fluid contents and no palpable solid component. Anesthesia: General, local 0.25% bupivacaine Complications: None. Date: 01/20/20  Under general anesthesia, [name] was placed in the dorsolithotomy position in [place] with sequential compression devices applied and functioning. The patient was prepped and draped in the usual sterile fashion.  The bladder was drained productive of clear urine.  A surgical team time out was performed to verify and agree on procedure and patient consent.  A sponge stick was placed in the vagina for use as a manipulator of the vaginal cuff.  The open Hasson technique was used by making a injecting 0.25% bupivacaine in the infraumbilical skin and making a 12 mm incision with the 11 blade scalpel.  The subcutaneous tissues were bluntly dissected to expose the fascia which was grasped with Kocher clamps and sharply entered with the Mayo scissors.  Peritoneum was bluntly dissected away and intraperitoneal access was confirmed.  A #0-Vicryl suture was used to pursestring around the fascial opening.  The Hasson trocar was inserted and intraperitoneal   position was confirmed.  The balloon was inflated with an air filled syringe and pneumoperitoneum was obtained to 15 mmHg.  The 10 mm laparoscope was inserted verifying an intraperitoneal position as well as verifying absence of injury to internal organs.  No injury to the omentum or bowel was noted after entry into the abdomen.  She was placed in slight Trendelenburg position.  Intraabdominal survey was conducted with findings as above.  A 5 mm left lateral and 5 mm right lateral trocar were placed under laparoscopic guidance, injecting the skin with local and transilluminating the abdominal wall to avoid vessel injury.     The omental adhesions in the left lower abdominal/pelvic wall and vaginal cuff were inspected and noted to be free of bowel.  Monopolar endoscopic scissors were used to trim the filmy adhesions until a vascular appearing pedicles were met and application of cautery here followed by transection of the pedicles with hemostatic edges noted.  LigaSure bipolar device was used to lyse the adhesions of the omentum in that area after they were carefully inspected again and noted to be free from bowel.  This afforded better visualization of the pelvis and sidewall.   20 minutes were spent in dissection of the adhesions.  The pelvis was irrigated and the contents were collected and sent to pathology for cytology analysis.  The course of the ureters was identified bilaterally.  The left ovary and cyst grasped with atraumatic forceps.  The LigaSure was used to secure the left infundibulopelvic ligament by doubly fulgurating and then transecting.  The left ovarian cyst remained intact and not ruptured throughout the process.  The specimen was set aside in the pelvis and attention was directed to the right adnexa.  The same process was carried out for the right adnexa, first  securing the right infundibulopelvic ligament by doubly fulgurating and then transecting using the LigaSure, staying well away from  the right ureter.  The laparoscope was switched to the 5 mm size and this was inserted into the lower quadrant trocar.  A 10 mm endoscopic bag was then inserted through the infraumbilical trocar and under direct visualization all specimens were placed into the bag intact.  The trocar balloon was deflated and the trocar was removed.  The bag was brought up to the surface of the abdominal wall to expose the cyst in the bag.  The cyst was sharply ruptured within the bag and drained of its fluid contents with the suction irrigator. The bag and contained specimens was removed from the abdominal cavity and passed off to be sent to pathology for analysis. The Hasson trocar was then placed back into the infraumbilical incision. Pneumoperitoneum was reestablished.   Hemostasis was observed in the pelvis at all pedicles and at the omental adhesion sites.  The bilateral ureters showed normal vermiculation and were well away from the sites of dissection.  Careful and comprehensive evaluation of both large and small bowel was completed and no signs of thermal injury, trocar injury, or other damage were present.  The bladder and ureters were also intact and normal.  Upon completion of the laparoscopic procedure, pneumoperitoneum was left intact and the three abdominal trocars were left in place for Dr. Marlou Starks to took over care of the patient to perform his planned procedure and closure of the trocar sites.  The sponge stick was removed from the vagina.  The patient tolerated the procedure well to this point.   A qualified surgical assistant was required as an active participant in this case because skilled help was needed for retraction of the operative site, safe exposure of anatomy, cutting suture materials, assistance with obtaining hemostasis, and closure of the surgical wounds. The credentialed surgical assistant was actively involved during the entire portion of this surgery.   Joseph Pierini, M.D., Cherlynn June

## 2020-01-20 NOTE — H&P (Signed)
Pamela Whitehead [birth year] MRN: [id number]  HPI The patient is a 69 y.o. G1P0010 who presents today for scheduled laparoscopic bilateral salpingo-oophorectomy and pelvic washings for a 6.3 cm left ovarian cyst that was incidentally discovered on imaging and prophylactic removal of the contralateral ovary.  She also will be having a cholecystectomy by Dr. Marlou Starks after the gyn portion of the procedure.  Normal tumor markers.  No changes to her medical history since her pre op exam, denies CP, SOB, fever/chills, dysuria.  Past Medical History:  Diagnosis Date  . Anaphylactic reaction    Unknown cause  . 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 Atypical squamous cell changes of undetermined significance (ASCUS) on vaginal cytology 12/2015, 05/2017   Negative high-risk HPV screen  . DES exposure in utero   . Endometriosis   . Hypertension   . Kidney infection    June 2014  . Peptic ulcer    currently Oct 2015  . PONV (postoperative nausea and vomiting)   . Spondylisthesis   . Ulcer   . 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 BREAST EXCISIONAL BIOPSY Left 2011   Benign  . BREAST SURGERY  02/25/2010   left breast biopsy ..for left breast mass. . . for [place]  . ECTOPIC PREGNANCY SURGERY    . KNEE SURGERY  2003  . RECTAL SURGERY    . TONSILLECTOMY    . VAGINAL HYSTERECTOMY  1993   vaginal hysterectomy   Allergies  Allergen Reactions  . Celebrex [Celecoxib] Anaphylaxis  . Penicillins Itching and Other (See Comments)   No current facility-administered medications on file prior to encounter.   Current Outpatient Medications on File Prior to Encounter  Medication Sig Dispense Refill  . acetaminophen (TYLENOL) 500 MG tablet Take 500 mg by mouth 2 (two) times daily as needed for headache.    . ALPRAZolam (XANAX) 1 MG  tablet Take 0.25-0.5 mg by mouth daily as needed for anxiety.     . Ascorbic Acid (VITAMIN C PO) Take 1 capsule by mouth daily.    . Cholecalciferol (VITAMIN D3 GUMMIES PO) Take 2 tablets by mouth daily.    Marland Kitchen ELDERBERRY PO Take 1 tablet by mouth daily. With zinc    . EPINEPHrine 0.3 mg/0.3 mL IJ SOAJ injection Inject 0.3 mLs (0.3 mg total) into the muscle as needed for anaphylaxis. 1 Device 0  . estradiol (ESTRACE) 1 MG tablet TAKE 1 TABLET(1 MG) BY MOUTH DAILY (Patient taking differently: Take 1 mg by mouth daily. ) 90 tablet 4  . hydrochlorothiazide (HYDRODIURIL) 25 MG tablet Take 25 mg by mouth daily.    Marland Kitchen loratadine (CLARITIN) 10 MG tablet Take 10 mg by mouth daily.    . metoprolol succinate (TOPROL-XL) 50 MG 24 hr tablet Take 100 mg by mouth at bedtime. Take with or immediately following a meal.    . mometasone (NASONEX) 50 MCG/ACT nasal spray Place 1 spray into the nose daily.     . Multiple Vitamins-Minerals (MULTIVITAMIN GUMMIES ADULT PO) Take 1 capsule by mouth daily.    Marland Kitchen omeprazole (PRILOSEC) 40 MG capsule Take 80 mg by mouth daily.     . Probiotic Product (CULTURELLE PROBIOTICS PO) Take 1 capsule by mouth daily.    . traMADol (ULTRAM) 50 MG tablet Take 25 mg by mouth daily as needed for pain.    Marland Kitchen  triamcinolone cream (KENALOG) 0.5 % Apply 1 application topically daily as needed for rash.    . valsartan (DIOVAN) 80 MG tablet Take 80 mg by mouth daily.    Marland Kitchen zolpidem (AMBIEN) 5 MG tablet TAKE 1 TABLET(5 MG) BY MOUTH AT BEDTIME AS NEEDED FOR SLEEP (Patient taking differently: Take 2.5 mg by mouth at bedtime as needed for sleep. ) 30 tablet 4  . estradiol (ESTRACE VAGINAL) 0.1 MG/GM vaginal cream Place 1 Applicatorful vaginally at bedtime. Nightly for 1 month then twice weekly after that. (Patient taking differently: Place 1 Applicatorful vaginally at bedtime as needed (dryness). ) 42.5 g 12      Physical Exam   BP (!) 145/64   Pulse 67   Temp 98.4 F (36.9 C) (Oral)   Resp 18    SpO2 100%       Plan Proceed with laparoscopic BSO as planned. Post op recovery restrictions reviewed.  All questions answered and the patient agrees to proceed.   Joseph Pierini, MD 01/20/20

## 2020-01-20 NOTE — H&P (Signed)
Pamela Whitehead  Location: [facility] Surgery Patient #: [id number] DOB: 1950/11/17 Divorced / Language: Pamela Whitehead / Race: White Female   History of Present Illness The patient is a 69 year old female who presents with abdominal pain. We are asked to see the patient in consultation by Dr. Penelope Coop to evaluate her for gallstones. The patient is a 69 year old white female who has had 3-4 attacks of abdominal pain in the last couple years. The pain has been located in the right upper quadrant and radiates into her back. The pain has been associated with significant nausea and vomiting. All foods seem to cause her discomfort. She recently had a CT and MRCP that showed stones in the gallbladder but no gallbladder wall thickening. There was ductal dilation but no common duct stone. Her liver functions have been slightly elevated.   Past Surgical History  Anal Fissure Repair  Breast Biopsy  Left. Breast Mass; Local Excision  Left. Colon Polyp Removal - Colonoscopy  Hysterectomy (not due to cancer) - Complete  Hysterectomy (not due to cancer) - Partial  Knee Surgery  Right. Oral Surgery  Tonsillectomy   Diagnostic Studies History  Colonoscopy  5-10 years ago Mammogram  within last year Pap Smear  1-5 years ago  Allergies Penicillin G Potassium *PENICILLINS*  CeleBREX *ANALGESICS - ANTI-INFLAMMATORY*  Allergies Reconciled   Medication History  ALPRAZolam (1MG  Tablet, Oral) Active. traMADol HCl (50MG  Tablet, Oral) Active. Tamiflu (75MG  Capsule, Oral) Active. Aciphex (20MG  Tablet DR, Oral) Active. Claritin (10MG  Tablet, Oral) Active. Estradiol (0.05MG /24HR Patch TW, Transdermal) Active. Metoprolol Succinate (50MG  CP24 Sprinkle, Oral) Active. Nasonex (50MCG/ACT Suspension, Nasal) Active. Famotidine (20MG  Tablet, Oral) Active. Omeprazole (40MG  Capsule DR, Oral) Active. Medications Reconciled  Pregnancy / Birth History  Age at menarche  76 years. Age  of menopause  >60 Gravida  1 Irregular periods  Maternal age  78-30 Para  0  Other Problems Cholelithiasis  Gastric Ulcer  General anesthesia - complications  High blood pressure  Lump In Breast  Other disease, cancer, significant illness  Transfusion history     Review of Systems  General Present- Chills, Fatigue and Weight Loss. Not Present- Appetite Loss, Fever, Night Sweats and Weight Gain. Skin Not Present- Change in Wart/Mole, Dryness, Hives, Jaundice, New Lesions, Non-Healing Wounds, Rash and Ulcer. HEENT Present- Seasonal Allergies and Wears glasses/contact lenses. Not Present- Earache, Hearing Loss, Hoarseness, Nose Bleed, Oral Ulcers, Ringing in the Ears, Sinus Pain, Sore Throat, Visual Disturbances and Yellow Eyes. Respiratory Not Present- Bloody sputum, Chronic Cough, Difficulty Breathing, Snoring and Wheezing. Breast Not Present- Breast Mass, Breast Pain, Nipple Discharge and Skin Changes. Cardiovascular Not Present- Chest Pain, Difficulty Breathing Lying Down, Leg Cramps, Palpitations, Rapid Heart Rate, Shortness of Breath and Swelling of Extremities. Gastrointestinal Present- Abdominal Pain, Excessive gas, Nausea and Vomiting. Not Present- Bloating, Bloody Stool, Change in Bowel Habits, Chronic diarrhea, Constipation, Difficulty Swallowing, Gets full quickly at meals, Hemorrhoids, Indigestion and Rectal Pain. Female Genitourinary Not Present- Frequency, Nocturia, Painful Urination, Pelvic Pain and Urgency. Musculoskeletal Present- Back Pain. Not Present- Joint Pain, Joint Stiffness, Muscle Pain, Muscle Weakness and Swelling of Extremities. Neurological Not Present- Decreased Memory, Fainting, Headaches, Numbness, Seizures, Tingling, Tremor, Trouble walking and Weakness. Psychiatric Not Present- Anxiety, Bipolar, Change in Sleep Pattern, Depression, Fearful and Frequent crying. Endocrine Not Present- Cold Intolerance, Excessive Hunger, Hair Changes, Heat  Intolerance, Hot flashes and New Diabetes. Hematology Not Present- Blood Thinners, Easy Bruising, Excessive bleeding, Gland problems, HIV and Persistent Infections.  Vitals Weight: 166.25 lb Height:  70in Body Surface Area: 1.93 m Body Mass Index: 23.85 kg/m  Temp.: 98.83F  Pulse: 70 (Regular)  P.OX: 98% (Room air) BP: 128/76(Sitting, Left Arm, Standard)       Physical Exam  General Mental Status-Alert. General Appearance-Consistent with stated age. Hydration-Well hydrated. Voice-Normal.  Head and Neck Head-normocephalic, atraumatic with no lesions or palpable masses. Trachea-midline. Thyroid Gland Characteristics - normal size and consistency.  Eye Eyeball - Bilateral-Extraocular movements intact. Sclera/Conjunctiva - Bilateral-No scleral icterus.  Chest and Lung Exam Chest and lung exam reveals -quiet, even and easy respiratory effort with no use of accessory muscles and on auscultation, normal breath sounds, no adventitious sounds and normal vocal resonance. Inspection Chest Wall - Normal. Back - normal.  Cardiovascular Cardiovascular examination reveals -normal heart sounds, regular rate and rhythm with no murmurs and normal pedal pulses bilaterally.  Abdomen Note: The abdomen is soft. There is mild right upper quadrant tenderness but no palpable mass. There is a well-healed lower midline scar   Neurologic Neurologic evaluation reveals -alert and oriented x 3 with no impairment of recent or remote memory. Mental Status-Normal.  Musculoskeletal Normal Exam - Left-Upper Extremity Strength Normal and Lower Extremity Strength Normal. Normal Exam - Right-Upper Extremity Strength Normal and Lower Extremity Strength Normal.  Lymphatic Head & Neck  General Head & Neck Lymphatics: Bilateral - Description - Normal. Axillary  General Axillary Region: Bilateral - Description - Normal. Tenderness - Non Tender. Femoral &  Inguinal  Generalized Femoral & Inguinal Lymphatics: Bilateral - Description - Normal. Tenderness - Non Tender.    Assessment & Plan GALLSTONES (K80.20) Impression: The patient appears to have symptomatic gallstones. Because of the risk of further painful episodes and possible pancreatitis and think she would benefit from having her gallbladder removed. She would also like to have this done. I have discussed with her in detail the risks and benefits of the operation to remove the gallbladder as well as some of the technical aspects including the risk of common duct injury and she understands and wishes to proceed. She also has a ovarian cyst and if this needs to be evaluated or removed at the same time we can always coordinate with her gynecologist. This patient encounter took 45 minutes today to perform the following: take history, perform exam, review outside records, interpret imaging, counsel the patient on their diagnosis and document encounter, findings & plan in the EHR

## 2020-01-20 NOTE — H&P (View-Only) (Signed)
Referring Provider: Dr. Marlou Starks ([place]) Primary Care Physician:  Aura Dials, MD Primary Gastroenterologist:  Dr. Penelope Coop [facility])  Reason for Consultation:  Choledocholithiasis/abnormal IOC  HPI: Pamela Whitehead is a 69 y.o. female with history of cholelithiasis and left ovarian cyst who underwent laparoscopic cholecystectomy and bilateral salpingo-oophorectomy today presenting for consultation of choledocholithiasis/abnormal IOC.  Patient reports that she has had intermittent biliary symptoms for several years. She has had intermittent abdominal pain, nausea, and vomiting. Denies any yellowing of the skin or eyes.  Denies any changes in bowel habits. Denies any melena or hematochezia.   Denies any blood thinner, aspirin, or NSAID use.   Denies any family history of gallbladder disorders or gastrointestinal malignancy.  Last colonoscopy 09/2012 was normal.  Past Medical History:  Diagnosis Date  . Anaphylactic reaction    Unknown cause  . 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 Atypical squamous cell changes of undetermined significance (ASCUS) on vaginal cytology 12/2015, 05/2017   Negative high-risk HPV screen  . DES exposure in utero   . Endometriosis   . Hypertension   . Kidney infection    June 2014  . Peptic ulcer    currently Oct 2015  . PONV (postoperative nausea and vomiting)   . Spondylisthesis   . Ulcer   . 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 BREAST EXCISIONAL BIOPSY Left 2011   Benign  . BREAST SURGERY  02/25/2010   left breast biopsy ..for left breast mass. . . for [place]  . ECTOPIC PREGNANCY SURGERY    . KNEE SURGERY  2003  . RECTAL SURGERY    . TONSILLECTOMY    . VAGINAL HYSTERECTOMY  1993   vaginal hysterectomy    Prior to Admission medications   Medication Sig Start Date End  Date Taking? Authorizing Provider  acetaminophen (TYLENOL) 500 MG tablet Take 500 mg by mouth 2 (two) times daily as needed for headache.   Yes [provider]  ALPRAZolam Duanne Moron) 1 MG tablet Take 0.25-0.5 mg by mouth daily as needed for anxiety.    Yes [provider]  Ascorbic Acid (VITAMIN C PO) Take 1 capsule by mouth daily.   Yes [provider]  Cholecalciferol (VITAMIN D3 GUMMIES PO) Take 2 tablets by mouth daily.   Yes [provider]  ELDERBERRY PO Take 1 tablet by mouth daily. With zinc   Yes [provider]  EPINEPHrine 0.3 mg/0.3 mL IJ SOAJ injection Inject 0.3 mLs (0.3 mg total) into the muscle as needed for anaphylaxis. 06/14/18  Yes Fontaine, Belinda Block, MD  estradiol (ESTRACE) 1 MG tablet TAKE 1 TABLET(1 MG) BY MOUTH DAILY Patient taking differently: Take 1 mg by mouth daily.  09/07/19  Yes Joseph Pierini, MD  hydrochlorothiazide (HYDRODIURIL) 25 MG tablet Take 25 mg by mouth daily.   Yes [provider]  loratadine (CLARITIN) 10 MG tablet Take 10 mg by mouth daily.   Yes [provider]  metoprolol succinate (TOPROL-XL) 50 MG 24 hr tablet Take 100 mg by mouth at bedtime. Take with or immediately following a meal.   Yes [provider]  mometasone (NASONEX) 50 MCG/ACT nasal spray Place 1 spray into the nose daily.    Yes [provider]  Multiple Vitamins-Minerals (MULTIVITAMIN GUMMIES ADULT PO) Take 1 capsule by mouth daily.   Yes [provider]  omeprazole (PRILOSEC) 40 MG capsule Take 80 mg by mouth daily.    Yes [provider]  Probiotic Product (CULTURELLE PROBIOTICS PO) Take 1 capsule by mouth daily.   Yes [provider]  traMADol (ULTRAM) 50 MG tablet Take 25 mg by mouth daily as needed for pain. 12/06/19  Yes [provider]  triamcinolone cream (KENALOG) 0.5 % Apply 1 application topically daily as needed for rash. 07/26/19  Yes [provider]   valsartan (DIOVAN) 80 MG tablet Take 80 mg by mouth daily.   Yes [provider]  zolpidem (AMBIEN) 5 MG tablet TAKE 1 TABLET(5 MG) BY MOUTH AT BEDTIME AS NEEDED FOR SLEEP Patient taking differently: Take 2.5 mg by mouth at bedtime as needed for sleep.  09/07/19  Yes Joseph Pierini, MD  estradiol (ESTRACE VAGINAL) 0.1 MG/GM vaginal cream Place 1 Applicatorful vaginally at bedtime. Nightly for 1 month then twice weekly after that. Patient taking differently: Place 1 Applicatorful vaginally at bedtime as needed (dryness).  09/07/19   Joseph Pierini, MD  oxyCODONE (OXY IR/ROXICODONE) 5 MG immediate release tablet Take 1-2 tablets (5-10 mg total) by mouth every 6 (six) hours as needed for moderate pain, severe pain or breakthrough pain. 01/20/20   Jovita Kussmaul, MD    Scheduled Meds: . Chlorhexidine Gluconate Cloth  6 each Topical Once   And  . Chlorhexidine Gluconate Cloth  6 each Topical Once  . fentaNYL      . povidone-iodine  2 application Topical Once   Continuous Infusions: . lactated ringers    . lactated ringers     PRN Meds:.amisulpride, fentaNYL (SUBLIMAZE) injection, ondansetron (ZOFRAN) IV, oxyCODONE **OR** oxyCODONE  Allergies as of 12/13/2019 - Review Complete 12/13/2019  Allergen Reaction Noted  . Celebrex [celecoxib] Anaphylaxis 10/16/2011  . Other Anaphylaxis 01/14/2017  . Penicillins Itching and Other (See Comments) 04/03/2011    Family History  Problem Relation Age of Onset  . Hypertension Mother   . Dementia Mother   . Stroke Mother   . Heart disease Father   . Breast cancer Maternal Aunt        Age 89's  . Heart disease Brother   . Melanoma Brother   . Stroke Brother     Social History   Socioeconomic History  . Marital status: Divorced    Spouse name: Not on file  . Number of children: Not on file  . Years of education: Not on file  . Highest education level: Not on file  Occupational History  . Not on file  Tobacco Use  . Smoking  status: Never Smoker  . Smokeless tobacco: Never Used  Vaping Use  . Vaping Use: Never used  Substance and Sexual Activity  . Alcohol use: Yes    Alcohol/week: 0.0 standard drinks    Comment: rare  . Drug use: No  . Sexual activity: Yes    Birth control/protection: Surgical    Comment: HYST-1st intercourse 80 yo--5 partners  Other Topics Concern  . Not on file  Social History Narrative  . Not on file   Social Determinants of Health   Financial Resource Strain:   . Difficulty of Paying Living Expenses: Not on file  Food Insecurity:   . Worried About Charity fundraiser in the Last Year: Not on file  . Ran Out of Food in the Last Year: Not on file  Transportation Needs:   . Lack of Transportation (Medical): Not on file  . Lack  of Transportation (Non-Medical): Not on file  Physical Activity:   . Days of Exercise per Week: Not on file  . Minutes of Exercise per Session: Not on file  Stress:   . Feeling of Stress : Not on file  Social Connections:   . Frequency of Communication with Friends and Family: Not on file  . Frequency of Social Gatherings with Friends and Family: Not on file  . Attends Religious Services: Not on file  . Active Member of Clubs or Organizations: Not on file  . Attends Archivist Meetings: Not on file  . Marital Status: Not on file  Intimate Partner Violence:   . Fear of Current or Ex-Partner: Not on file  . Emotionally Abused: Not on file  . Physically Abused: Not on file  . Sexually Abused: Not on file    Review of Systems: Review of Systems  Constitutional: Negative for chills and fever.  HENT: Negative for hearing loss and tinnitus.   Eyes: Negative for pain and redness.  Respiratory: Negative for cough and shortness of breath.   Cardiovascular: Negative for chest pain and palpitations.  Gastrointestinal: Positive for abdominal pain, nausea and vomiting. Negative for blood in stool, constipation, diarrhea, heartburn and melena.   Genitourinary: Negative for flank pain and hematuria.  Musculoskeletal: Negative for falls and joint pain.  Skin: Negative for itching and rash.  Neurological: Negative for seizures and loss of consciousness.  Endo/Heme/Allergies: Negative for polydipsia. Does not bruise/bleed easily.  Psychiatric/Behavioral: Negative for substance abuse. The patient is not nervous/anxious.     Physical Exam: Vital signs: Vitals:   01/20/20 1133 01/20/20 1149  BP: 135/69 140/73  Pulse: 64 (!) 59  Resp: 12 19  Temp:    SpO2: 100% 100%     Physical Exam Vitals reviewed.  Constitutional:      General: She is not in acute distress.    Interventions: Nasal cannula in place.  HENT:     Head: Normocephalic and atraumatic.     Nose: Nose normal. No congestion.     Mouth/Throat:     Mouth: Mucous membranes are moist.     Pharynx: Oropharynx is clear.  Eyes:     General: No scleral icterus.    Extraocular Movements: Extraocular movements intact.     Conjunctiva/sclera: Conjunctivae normal.  Cardiovascular:     Rate and Rhythm: Normal rate and regular rhythm.     Pulses: Normal pulses.  Pulmonary:     Effort: Pulmonary effort is normal. No respiratory distress.     Breath sounds: Normal breath sounds.  Abdominal:     General: Bowel sounds are normal. There is distension (mild).     Palpations: Abdomen is soft. There is no mass.     Tenderness: There is abdominal tenderness (moderate, diffuse). There is no guarding or rebound.     Hernia: No hernia is present.     Comments: Laparoscopic sites approximated with surgical adhesive  Musculoskeletal:        General: No swelling or tenderness.     Cervical back: Normal range of motion and neck supple.  Skin:    General: Skin is warm and dry.     Coloration: Skin is not jaundiced.  Neurological:     General: No focal deficit present.     Mental Status: She is oriented to person, place, and time. She is lethargic.  Psychiatric:        Mood and  Affect: Mood normal.  Behavior: Behavior normal. Behavior is cooperative.      GI:  Lab Results: No results for input(s): WBC, HGB, HCT, PLT in the last 72 hours. BMET No results for input(s): NA, K, CL, CO2, GLUCOSE, BUN, CREATININE, CALCIUM in the last 72 hours. LFT No results for input(s): PROT, ALBUMIN, AST, ALT, ALKPHOS, BILITOT, BILIDIR, IBILI in the last 72 hours. PT/INR No results for input(s): LABPROT, INR in the last 72 hours.   Studies/Results: DG Cholangiogram Operative  Result Date: 01/20/2020 CLINICAL DATA:  69 year old female with a history cholelithiasis EXAM: INTRAOPERATIVE CHOLANGIOGRAM TECHNIQUE: Cholangiographic images from the C-arm fluoroscopic device were submitted for interpretation post-operatively. Please see the procedural report for the amount of contrast and the fluoroscopy time utilized. COMPARISON:  None. FINDINGS: Surgical instruments project over the upper abdomen. There is cannulation of the cystic duct/gallbladder neck, with antegrade infusion of contrast. Caliber of the extrahepatic ductal system within normal limits. Geometric filling defects within the distal common bile duct. Free flow of contrast across the ampulla. IMPRESSION: Intraoperative cholangiogram demonstrates extrahepatic biliary ducts of unremarkable caliber, with choledocholithiasis. Correlation with ERCP may be indicated. Please refer to the dictated operative report for full details of intraoperative findings and procedure Electronically Signed   By: Corrie Mckusick D.O.   On: 01/20/2020 10:01    Impression: Choledocholithiasis: Intraoperative cholangiogram today demonstrates extrahepatic biliary ducts of unremarkable caliber, with choledocholithiasis.  -T bili 0.4/AST 43/ALT 107/ALP 177 as of 01/10/2020  Patient underwent laparoscopic cholecystectomy and bilateral salpingo-oophorectomies today  History of post-operative nausea/vomiting  Plan: ERCP tomorrow. We thoroughly  discussed the procedure with the patient to include nature, alternatives, benefits, and risks (including but not limited to bleeding, infection, perforation, anesthesia/cardiac and pulmonary complications). Patient verbalized understanding and gave verbal consent to proceed with ERCP.  CBC/CMP daily.  Clear liquid diet today with n.p.o. after midnight.  [facility] will follow.   LOS: 0 days   Salley Slaughter  PA-C 01/20/2020, 12:02 PM  Contact #  [phone]

## 2020-01-20 NOTE — Brief Op Note (Signed)
01/20/2020  9:27 AM  PATIENT:  Pamela Whitehead  69 y.o. female  PRE-OPERATIVE DIAGNOSIS:  left ovarian cyst, desires prophylactic right salpingo-oophorectomy, postmenopausal  POST-OPERATIVE DIAGNOSIS:  same  PROCEDURE:  Procedure(s) with comments: LAPAROSCOPIC BILATERAL SALPINGO OOPHORECTOMY (Bilateral), lysis of adhesions - Dr. Delilah Shan with be working in Dr. Ethlyn Gallery block time. To start at 7:30am  requests one hour [place] CHOLANGIOGRAM (N/A)  SURGEON:  Surgeon(s) and Role: Panel 1:    * Joseph Pierini, MD - Primary    * Princess Bruins, MD - Assisting  ANESTHESIA:   local and general  EBL: 5 ml  BLOOD ADMINISTERED:none (for gyn portion of the procedure)  DRAINS: none (for gyn portion of the procedure)  LOCAL MEDICATIONS USED:  BUPIVICAINE   SPECIMEN: right ovary and fallopian tube, left ovary and cyst with fallopian tube  DISPOSITION OF SPECIMEN:  PATHOLOGY  COUNTS:  YES  TOURNIQUET:  * No tourniquets in log *  DICTATION: .Note written in EPIC  PLAN OF CARE: Discharge to home after PACU  PATIENT DISPOSITION: The three abdominal trocars are left in place for Dr. Marlou Starks to took over care of the patient to perform his planned procedure

## 2020-01-20 NOTE — Interval H&P Note (Signed)
History and Physical Interval Note:  01/20/2020 8:11 AM  Pamela Whitehead  has presented today for surgery, with the diagnosis of left ovarian cyst.  The various methods of treatment have been discussed with the patient and family. After consideration of risks, benefits and other options for treatment, the patient has consented to  Procedure(s) with comments: LAPAROSCOPIC BILATERAL SALPINGO OOPHORECTOMY (Bilateral) - Dr. Delilah Shan with be working in Dr. Ethlyn Gallery block time. To start at 7:30am requests one hour LAPAROSCOPIC CHOLECYSTECTOMY WITH INTRAOPERATIVE CHOLANGIOGRAM (N/A) as a surgical intervention.  The patient's history has been reviewed, patient examined, no change in status, stable for surgery.  I have reviewed the patient's chart and labs.  Questions were answered to the patient's satisfaction.     Autumn Messing III

## 2020-01-20 NOTE — Anesthesia Procedure Notes (Signed)
Performed by: Leenah Seidner P, CRNA       

## 2020-01-20 NOTE — Consult Note (Signed)
Referring Provider: Dr. Marlou Starks ([place]) Primary Care Physician:  Aura Dials, MD Primary Gastroenterologist:  Dr. Penelope Coop [facility])  Reason for Consultation:  Choledocholithiasis/abnormal IOC  HPI: Pamela Whitehead is a 69 y.o. female with history of cholelithiasis and left ovarian cyst who underwent laparoscopic cholecystectomy and bilateral salpingo-oophorectomy today presenting for consultation of choledocholithiasis/abnormal IOC.  Patient reports that she has had intermittent biliary symptoms for several years. She has had intermittent abdominal pain, nausea, and vomiting. Denies any yellowing of the skin or eyes.  Denies any changes in bowel habits. Denies any melena or hematochezia.   Denies any blood thinner, aspirin, or NSAID use.   Denies any family history of gallbladder disorders or gastrointestinal malignancy.  Last colonoscopy 09/2012 was normal.  Past Medical History:  Diagnosis Date  . Anaphylactic reaction    Unknown cause  . 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 Atypical squamous cell changes of undetermined significance (ASCUS) on vaginal cytology 12/2015, 05/2017   Negative high-risk HPV screen  . DES exposure in utero   . Endometriosis   . Hypertension   . Kidney infection    June 2014  . Peptic ulcer    currently Oct 2015  . PONV (postoperative nausea and vomiting)   . Spondylisthesis   . Ulcer   . 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 BREAST EXCISIONAL BIOPSY Left 2011   Benign  . BREAST SURGERY  02/25/2010   left breast biopsy ..for left breast mass. . . for [place]  . ECTOPIC PREGNANCY SURGERY    . KNEE SURGERY  2003  . RECTAL SURGERY    . TONSILLECTOMY    . VAGINAL HYSTERECTOMY  1993   vaginal hysterectomy    Prior to Admission medications   Medication Sig Start Date End  Date Taking? Authorizing Provider  acetaminophen (TYLENOL) 500 MG tablet Take 500 mg by mouth 2 (two) times daily as needed for headache.   Yes [provider]  ALPRAZolam Duanne Moron) 1 MG tablet Take 0.25-0.5 mg by mouth daily as needed for anxiety.    Yes [provider]  Ascorbic Acid (VITAMIN C PO) Take 1 capsule by mouth daily.   Yes [provider]  Cholecalciferol (VITAMIN D3 GUMMIES PO) Take 2 tablets by mouth daily.   Yes [provider]  ELDERBERRY PO Take 1 tablet by mouth daily. With zinc   Yes [provider]  EPINEPHrine 0.3 mg/0.3 mL IJ SOAJ injection Inject 0.3 mLs (0.3 mg total) into the muscle as needed for anaphylaxis. 06/14/18  Yes Fontaine, Belinda Block, MD  estradiol (ESTRACE) 1 MG tablet TAKE 1 TABLET(1 MG) BY MOUTH DAILY Patient taking differently: Take 1 mg by mouth daily.  09/07/19  Yes Joseph Pierini, MD  hydrochlorothiazide (HYDRODIURIL) 25 MG tablet Take 25 mg by mouth daily.   Yes [provider]  loratadine (CLARITIN) 10 MG tablet Take 10 mg by mouth daily.   Yes [provider]  metoprolol succinate (TOPROL-XL) 50 MG 24 hr tablet Take 100 mg by mouth at bedtime. Take with or immediately following a meal.   Yes [provider]  mometasone (NASONEX) 50 MCG/ACT nasal spray Place 1 spray into the nose daily.    Yes [provider]  Multiple Vitamins-Minerals (MULTIVITAMIN GUMMIES ADULT PO) Take 1 capsule by mouth daily.   Yes [provider]  omeprazole (PRILOSEC) 40 MG capsule Take 80 mg by mouth daily.    Yes [provider]  Probiotic Product (CULTURELLE PROBIOTICS PO) Take 1 capsule by mouth daily.   Yes [provider]  traMADol (ULTRAM) 50 MG tablet Take 25 mg by mouth daily as needed for pain. 12/06/19  Yes [provider]  triamcinolone cream (KENALOG) 0.5 % Apply 1 application topically daily as needed for rash. 07/26/19  Yes [provider]   valsartan (DIOVAN) 80 MG tablet Take 80 mg by mouth daily.   Yes [provider]  zolpidem (AMBIEN) 5 MG tablet TAKE 1 TABLET(5 MG) BY MOUTH AT BEDTIME AS NEEDED FOR SLEEP Patient taking differently: Take 2.5 mg by mouth at bedtime as needed for sleep.  09/07/19  Yes Joseph Pierini, MD  estradiol (ESTRACE VAGINAL) 0.1 MG/GM vaginal cream Place 1 Applicatorful vaginally at bedtime. Nightly for 1 month then twice weekly after that. Patient taking differently: Place 1 Applicatorful vaginally at bedtime as needed (dryness).  09/07/19   Joseph Pierini, MD  oxyCODONE (OXY IR/ROXICODONE) 5 MG immediate release tablet Take 1-2 tablets (5-10 mg total) by mouth every 6 (six) hours as needed for moderate pain, severe pain or breakthrough pain. 01/20/20   Jovita Kussmaul, MD    Scheduled Meds: . Chlorhexidine Gluconate Cloth  6 each Topical Once   And  . Chlorhexidine Gluconate Cloth  6 each Topical Once  . fentaNYL      . povidone-iodine  2 application Topical Once   Continuous Infusions: . lactated ringers    . lactated ringers     PRN Meds:.amisulpride, fentaNYL (SUBLIMAZE) injection, ondansetron (ZOFRAN) IV, oxyCODONE **OR** oxyCODONE  Allergies as of 12/13/2019 - Review Complete 12/13/2019  Allergen Reaction Noted  . Celebrex [celecoxib] Anaphylaxis 10/16/2011  . Other Anaphylaxis 01/14/2017  . Penicillins Itching and Other (See Comments) 04/03/2011    Family History  Problem Relation Age of Onset  . Hypertension Mother   . Dementia Mother   . Stroke Mother   . Heart disease Father   . Breast cancer Maternal Aunt        Age 11's  . Heart disease Brother   . Melanoma Brother   . Stroke Brother     Social History   Socioeconomic History  . Marital status: Divorced    Spouse name: Not on file  . Number of children: Not on file  . Years of education: Not on file  . Highest education level: Not on file  Occupational History  . Not on file  Tobacco Use  . Smoking  status: Never Smoker  . Smokeless tobacco: Never Used  Vaping Use  . Vaping Use: Never used  Substance and Sexual Activity  . Alcohol use: Yes    Alcohol/week: 0.0 standard drinks    Comment: rare  . Drug use: No  . Sexual activity: Yes    Birth control/protection: Surgical    Comment: HYST-1st intercourse 42 yo--5 partners  Other Topics Concern  . Not on file  Social History Narrative  . Not on file   Social Determinants of Health   Financial Resource Strain:   . Difficulty of Paying Living Expenses: Not on file  Food Insecurity:   . Worried About Charity fundraiser in the Last Year: Not on file  . Ran Out of Food in the Last Year: Not on file  Transportation Needs:   . Lack of Transportation (Medical): Not on file  . Lack  of Transportation (Non-Medical): Not on file  Physical Activity:   . Days of Exercise per Week: Not on file  . Minutes of Exercise per Session: Not on file  Stress:   . Feeling of Stress : Not on file  Social Connections:   . Frequency of Communication with Friends and Family: Not on file  . Frequency of Social Gatherings with Friends and Family: Not on file  . Attends Religious Services: Not on file  . Active Member of Clubs or Organizations: Not on file  . Attends Archivist Meetings: Not on file  . Marital Status: Not on file  Intimate Partner Violence:   . Fear of Current or Ex-Partner: Not on file  . Emotionally Abused: Not on file  . Physically Abused: Not on file  . Sexually Abused: Not on file    Review of Systems: Review of Systems  Constitutional: Negative for chills and fever.  HENT: Negative for hearing loss and tinnitus.   Eyes: Negative for pain and redness.  Respiratory: Negative for cough and shortness of breath.   Cardiovascular: Negative for chest pain and palpitations.  Gastrointestinal: Positive for abdominal pain, nausea and vomiting. Negative for blood in stool, constipation, diarrhea, heartburn and melena.   Genitourinary: Negative for flank pain and hematuria.  Musculoskeletal: Negative for falls and joint pain.  Skin: Negative for itching and rash.  Neurological: Negative for seizures and loss of consciousness.  Endo/Heme/Allergies: Negative for polydipsia. Does not bruise/bleed easily.  Psychiatric/Behavioral: Negative for substance abuse. The patient is not nervous/anxious.     Physical Exam: Vital signs: Vitals:   01/20/20 1133 01/20/20 1149  BP: 135/69 140/73  Pulse: 64 (!) 59  Resp: 12 19  Temp:    SpO2: 100% 100%     Physical Exam Vitals reviewed.  Constitutional:      General: She is not in acute distress.    Interventions: Nasal cannula in place.  HENT:     Head: Normocephalic and atraumatic.     Nose: Nose normal. No congestion.     Mouth/Throat:     Mouth: Mucous membranes are moist.     Pharynx: Oropharynx is clear.  Eyes:     General: No scleral icterus.    Extraocular Movements: Extraocular movements intact.     Conjunctiva/sclera: Conjunctivae normal.  Cardiovascular:     Rate and Rhythm: Normal rate and regular rhythm.     Pulses: Normal pulses.  Pulmonary:     Effort: Pulmonary effort is normal. No respiratory distress.     Breath sounds: Normal breath sounds.  Abdominal:     General: Bowel sounds are normal. There is distension (mild).     Palpations: Abdomen is soft. There is no mass.     Tenderness: There is abdominal tenderness (moderate, diffuse). There is no guarding or rebound.     Hernia: No hernia is present.     Comments: Laparoscopic sites approximated with surgical adhesive  Musculoskeletal:        General: No swelling or tenderness.     Cervical back: Normal range of motion and neck supple.  Skin:    General: Skin is warm and dry.     Coloration: Skin is not jaundiced.  Neurological:     General: No focal deficit present.     Mental Status: She is oriented to person, place, and time. She is lethargic.  Psychiatric:        Mood and  Affect: Mood normal.  Behavior: Behavior normal. Behavior is cooperative.      GI:  Lab Results: No results for input(s): WBC, HGB, HCT, PLT in the last 72 hours. BMET No results for input(s): NA, K, CL, CO2, GLUCOSE, BUN, CREATININE, CALCIUM in the last 72 hours. LFT No results for input(s): PROT, ALBUMIN, AST, ALT, ALKPHOS, BILITOT, BILIDIR, IBILI in the last 72 hours. PT/INR No results for input(s): LABPROT, INR in the last 72 hours.   Studies/Results: DG Cholangiogram Operative  Result Date: 01/20/2020 CLINICAL DATA:  69 year old female with a history cholelithiasis EXAM: INTRAOPERATIVE CHOLANGIOGRAM TECHNIQUE: Cholangiographic images from the C-arm fluoroscopic device were submitted for interpretation post-operatively. Please see the procedural report for the amount of contrast and the fluoroscopy time utilized. COMPARISON:  None. FINDINGS: Surgical instruments project over the upper abdomen. There is cannulation of the cystic duct/gallbladder neck, with antegrade infusion of contrast. Caliber of the extrahepatic ductal system within normal limits. Geometric filling defects within the distal common bile duct. Free flow of contrast across the ampulla. IMPRESSION: Intraoperative cholangiogram demonstrates extrahepatic biliary ducts of unremarkable caliber, with choledocholithiasis. Correlation with ERCP may be indicated. Please refer to the dictated operative report for full details of intraoperative findings and procedure Electronically Signed   By: Corrie Mckusick D.O.   On: 01/20/2020 10:01    Impression: Choledocholithiasis: Intraoperative cholangiogram today demonstrates extrahepatic biliary ducts of unremarkable caliber, with choledocholithiasis.  -T bili 0.4/AST 43/ALT 107/ALP 177 as of 01/10/2020  Patient underwent laparoscopic cholecystectomy and bilateral salpingo-oophorectomies today  History of post-operative nausea/vomiting  Plan: ERCP tomorrow. We thoroughly  discussed the procedure with the patient to include nature, alternatives, benefits, and risks (including but not limited to bleeding, infection, perforation, anesthesia/cardiac and pulmonary complications). Patient verbalized understanding and gave verbal consent to proceed with ERCP.  CBC/CMP daily.  Clear liquid diet today with n.p.o. after midnight.  [facility] will follow.   LOS: 0 days   Salley Slaughter  PA-C 01/20/2020, 12:02 PM  Contact #  [phone]

## 2020-01-20 NOTE — Transfer of Care (Signed)
Immediate Anesthesia Transfer of Care Note  Patient: Pamela Whitehead  Procedure(s) Performed: LAPAROSCOPIC BILATERAL SALPINGO OOPHORECTOMY (Bilateral ) LAPAROSCOPIC CHOLECYSTECTOMY WITH INTRAOPERATIVE CHOLANGIOGRAM (N/A )  Patient Location: PACU  Anesthesia Type:General  Level of Consciousness: drowsy and patient cooperative  Airway & Oxygen Therapy: Patient Spontanous Breathing and Patient connected to face mask oxygen  Post-op Assessment: Report given to RN and Post -op Vital signs reviewed and stable  Post vital signs: Reviewed and stable  Last Vitals:  Vitals Value Taken Time  BP 134/69 01/20/20 1033  Temp    Pulse 68 01/20/20 1034  Resp 13 01/20/20 1034  SpO2 100 % 01/20/20 1034  Vitals shown include unvalidated device data.  Last Pain:  Vitals:   01/20/20 0630  TempSrc:   PainSc: 0-No pain      Patients Stated Pain Goal: 3 (23/00/97 9499)  Complications: No complications documented.

## 2020-01-20 NOTE — Anesthesia Procedure Notes (Signed)
Procedure Name: Intubation Date/Time: 01/20/2020 7:34 AM Performed by: Lowella Dell, CRNA Pre-anesthesia Checklist: Patient identified, Emergency Drugs available, Suction available and Patient being monitored Patient Re-evaluated:Patient Re-evaluated prior to induction Oxygen Delivery Method: Circle System Utilized Preoxygenation: Pre-oxygenation with 100% oxygen Induction Type: IV induction Ventilation: Mask ventilation without difficulty Laryngoscope Size: Mac and 3 Grade View: Grade I Tube type: Oral Tube size: 7.0 mm Number of attempts: 1 Airway Equipment and Method: Stylet Placement Confirmation: ETT inserted through vocal cords under direct vision,  positive ETCO2 and breath sounds checked- equal and bilateral Secured at: 22 cm Tube secured with: Tape Dental Injury: Teeth and Oropharynx as per pre-operative assessment

## 2020-01-20 NOTE — Progress Notes (Signed)
Patient arrived to room 6N03 from PACU via bed. Patient is alert and oriented times 4. VSS. NAD. Belongings and call bell within reach. Bed is in the lowest and locked position with bed rails up times 2. Patient oriented to unit and call bell. Patient updated as to plan of care and all questions addressed at this time.

## 2020-01-21 ENCOUNTER — Ambulatory Visit (HOSPITAL_COMMUNITY): Payer: Medicare Other | Admitting: Anesthesiology

## 2020-01-21 ENCOUNTER — Encounter (HOSPITAL_COMMUNITY): Payer: Self-pay | Admitting: Obstetrics and Gynecology

## 2020-01-21 ENCOUNTER — Encounter (HOSPITAL_COMMUNITY)
Admission: RE | Disposition: A | Discharge status: Home / Self Care | Payer: Self-pay | Source: Home / Self Care | Attending: General Surgery

## 2020-01-21 ENCOUNTER — Ambulatory Visit (HOSPITAL_COMMUNITY): Payer: Medicare Other

## 2020-01-21 DIAGNOSIS — R109 Unspecified abdominal pain: Secondary | ICD-10-CM | POA: Diagnosis not present

## 2020-01-21 DIAGNOSIS — R748 Abnormal levels of other serum enzymes: Secondary | ICD-10-CM | POA: Diagnosis not present

## 2020-01-21 DIAGNOSIS — J9811 Atelectasis: Secondary | ICD-10-CM | POA: Diagnosis not present

## 2020-01-21 DIAGNOSIS — Z90710 Acquired absence of both cervix and uterus: Secondary | ICD-10-CM | POA: Diagnosis not present

## 2020-01-21 DIAGNOSIS — K6811 Postprocedural retroperitoneal abscess: Secondary | ICD-10-CM | POA: Diagnosis not present

## 2020-01-21 DIAGNOSIS — T8143XA Infection following a procedure, organ and space surgical site, initial encounter: Secondary | ICD-10-CM | POA: Diagnosis not present

## 2020-01-21 DIAGNOSIS — T8189XA Other complications of procedures, not elsewhere classified, initial encounter: Secondary | ICD-10-CM | POA: Diagnosis not present

## 2020-01-21 DIAGNOSIS — K8020 Calculus of gallbladder without cholecystitis without obstruction: Secondary | ICD-10-CM | POA: Diagnosis present

## 2020-01-21 DIAGNOSIS — N809 Endometriosis, unspecified: Secondary | ICD-10-CM | POA: Diagnosis not present

## 2020-01-21 DIAGNOSIS — R932 Abnormal findings on diagnostic imaging of liver and biliary tract: Secondary | ICD-10-CM | POA: Diagnosis present

## 2020-01-21 DIAGNOSIS — I10 Essential (primary) hypertension: Secondary | ICD-10-CM | POA: Diagnosis present

## 2020-01-21 DIAGNOSIS — Z803 Family history of malignant neoplasm of breast: Secondary | ICD-10-CM | POA: Diagnosis not present

## 2020-01-21 DIAGNOSIS — N838 Other noninflammatory disorders of ovary, fallopian tube and broad ligament: Secondary | ICD-10-CM | POA: Diagnosis not present

## 2020-01-21 DIAGNOSIS — K668 Other specified disorders of peritoneum: Secondary | ICD-10-CM | POA: Diagnosis not present

## 2020-01-21 DIAGNOSIS — Z9049 Acquired absence of other specified parts of digestive tract: Secondary | ICD-10-CM | POA: Diagnosis not present

## 2020-01-21 DIAGNOSIS — Z6823 Body mass index (BMI) 23.0-23.9, adult: Secondary | ICD-10-CM | POA: Diagnosis not present

## 2020-01-21 DIAGNOSIS — N62 Hypertrophy of breast: Secondary | ICD-10-CM | POA: Diagnosis present

## 2020-01-21 DIAGNOSIS — Z880 Allergy status to penicillin: Secondary | ICD-10-CM | POA: Diagnosis not present

## 2020-01-21 DIAGNOSIS — Z79899 Other long term (current) drug therapy: Secondary | ICD-10-CM | POA: Diagnosis not present

## 2020-01-21 DIAGNOSIS — K660 Peritoneal adhesions (postprocedural) (postinfection): Secondary | ICD-10-CM | POA: Diagnosis present

## 2020-01-21 DIAGNOSIS — E440 Moderate protein-calorie malnutrition: Secondary | ICD-10-CM | POA: Diagnosis present

## 2020-01-21 DIAGNOSIS — Z885 Allergy status to narcotic agent status: Secondary | ICD-10-CM | POA: Diagnosis not present

## 2020-01-21 DIAGNOSIS — N83202 Unspecified ovarian cyst, left side: Secondary | ICD-10-CM | POA: Diagnosis present

## 2020-01-21 DIAGNOSIS — K838 Other specified diseases of biliary tract: Secondary | ICD-10-CM | POA: Diagnosis not present

## 2020-01-21 DIAGNOSIS — K8070 Calculus of gallbladder and bile duct without cholecystitis without obstruction: Secondary | ICD-10-CM | POA: Diagnosis present

## 2020-01-21 DIAGNOSIS — K9189 Other postprocedural complications and disorders of digestive system: Secondary | ICD-10-CM | POA: Diagnosis not present

## 2020-01-21 DIAGNOSIS — G8918 Other acute postprocedural pain: Secondary | ICD-10-CM | POA: Diagnosis not present

## 2020-01-21 DIAGNOSIS — Z888 Allergy status to other drugs, medicaments and biological substances status: Secondary | ICD-10-CM | POA: Diagnosis not present

## 2020-01-21 DIAGNOSIS — Z79890 Hormone replacement therapy (postmenopausal): Secondary | ICD-10-CM | POA: Diagnosis not present

## 2020-01-21 DIAGNOSIS — Z808 Family history of malignant neoplasm of other organs or systems: Secondary | ICD-10-CM | POA: Diagnosis not present

## 2020-01-21 DIAGNOSIS — Z8719 Personal history of other diseases of the digestive system: Secondary | ICD-10-CM | POA: Diagnosis not present

## 2020-01-21 DIAGNOSIS — Z823 Family history of stroke: Secondary | ICD-10-CM | POA: Diagnosis not present

## 2020-01-21 DIAGNOSIS — K8050 Calculus of bile duct without cholangitis or cholecystitis without obstruction: Secondary | ICD-10-CM | POA: Diagnosis not present

## 2020-01-21 DIAGNOSIS — Z8249 Family history of ischemic heart disease and other diseases of the circulatory system: Secondary | ICD-10-CM | POA: Diagnosis not present

## 2020-01-21 DIAGNOSIS — K279 Peptic ulcer, site unspecified, unspecified as acute or chronic, without hemorrhage or perforation: Secondary | ICD-10-CM | POA: Diagnosis present

## 2020-01-21 DIAGNOSIS — Z978 Presence of other specified devices: Secondary | ICD-10-CM | POA: Diagnosis not present

## 2020-01-21 DIAGNOSIS — R5082 Postprocedural fever: Secondary | ICD-10-CM | POA: Diagnosis not present

## 2020-01-21 HISTORY — PX: ERCP: SHX5425

## 2020-01-21 HISTORY — PX: PANCREATIC STENT PLACEMENT: SHX5539

## 2020-01-21 HISTORY — PX: SPHINCTEROTOMY: SHX5544

## 2020-01-21 HISTORY — PX: REMOVAL OF STONES: SHX5545

## 2020-01-21 LAB — CBC WITH DIFFERENTIAL/PLATELET
Abs Immature Granulocytes: 0.02 10*3/uL (ref 0.00–0.07)
Basophils Absolute: 0 10*3/uL (ref 0.0–0.1)
Basophils Relative: 0 %
Eosinophils Absolute: 0 10*3/uL (ref 0.0–0.5)
Eosinophils Relative: 0 %
HCT: 36.6 % (ref 36.0–46.0)
Hemoglobin: 12.2 g/dL (ref 12.0–15.0)
Immature Granulocytes: 0 %
Lymphocytes Relative: 18 %
Lymphs Abs: 1.9 10*3/uL (ref 0.7–4.0)
MCH: 32.1 pg (ref 26.0–34.0)
MCHC: 33.3 g/dL (ref 30.0–36.0)
MCV: 96.3 fL (ref 80.0–100.0)
Monocytes Absolute: 1 10*3/uL (ref 0.1–1.0)
Monocytes Relative: 10 %
Neutro Abs: 7.2 10*3/uL (ref 1.7–7.7)
Neutrophils Relative %: 72 %
Platelets: 295 10*3/uL (ref 150–400)
RBC: 3.8 MIL/uL — ABNORMAL LOW (ref 3.87–5.11)
RDW: 12.6 % (ref 11.5–15.5)
WBC: 10 10*3/uL (ref 4.0–10.5)
nRBC: 0 % (ref 0.0–0.2)

## 2020-01-21 LAB — COMPREHENSIVE METABOLIC PANEL
ALT: 61 U/L — ABNORMAL HIGH (ref 0–44)
AST: 59 U/L — ABNORMAL HIGH (ref 15–41)
Albumin: 3.1 g/dL — ABNORMAL LOW (ref 3.5–5.0)
Alkaline Phosphatase: 210 U/L — ABNORMAL HIGH (ref 38–126)
Anion gap: 8 (ref 5–15)
BUN: 17 mg/dL (ref 8–23)
CO2: 26 mmol/L (ref 22–32)
Calcium: 8.8 mg/dL — ABNORMAL LOW (ref 8.9–10.3)
Chloride: 106 mmol/L (ref 98–111)
Creatinine, Ser: 1.27 mg/dL — ABNORMAL HIGH (ref 0.44–1.00)
GFR, Estimated: 46 mL/min — ABNORMAL LOW (ref >60)
Glucose, Bld: 134 mg/dL — ABNORMAL HIGH (ref 70–99)
Potassium: 4.2 mmol/L (ref 3.5–5.1)
Sodium: 140 mmol/L (ref 135–145)
Total Bilirubin: 0.8 mg/dL (ref 0.3–1.2)
Total Protein: 5.6 g/dL — ABNORMAL LOW (ref 6.5–8.1)

## 2020-01-21 SURGERY — ERCP, WITH INTERVENTION IF INDICATED
Anesthesia: General

## 2020-01-21 MED ORDER — DEXAMETHASONE SODIUM PHOSPHATE 10 MG/ML IJ SOLN
INTRAMUSCULAR | Status: DC | PRN
  Administered 2020-01-21: 10 mg via INTRAVENOUS

## 2020-01-21 MED ORDER — INDOMETHACIN 50 MG RE SUPP
RECTAL | Status: AC
  Filled 2020-01-21: qty 1

## 2020-01-21 MED ORDER — LIDOCAINE 2% (20 MG/ML) 5 ML SYRINGE
INTRAMUSCULAR | Status: DC | PRN
  Administered 2020-01-21: 100 mg via INTRAVENOUS

## 2020-01-21 MED ORDER — CIPROFLOXACIN IN D5W 400 MG/200ML IV SOLN
INTRAVENOUS | Status: AC
  Filled 2020-01-21: qty 200

## 2020-01-21 MED ORDER — FENTANYL CITRATE (PF) 250 MCG/5ML IJ SOLN
INTRAMUSCULAR | Status: AC
  Filled 2020-01-21: qty 5

## 2020-01-21 MED ORDER — PHENYLEPHRINE HCL (PRESSORS) 10 MG/ML IV SOLN
INTRAVENOUS | Status: DC | PRN
  Administered 2020-01-21: 80 ug via INTRAVENOUS
  Administered 2020-01-21 (×2): 40 ug via INTRAVENOUS

## 2020-01-21 MED ORDER — MIDAZOLAM HCL 2 MG/2ML IJ SOLN
INTRAMUSCULAR | Status: AC
  Filled 2020-01-21: qty 2

## 2020-01-21 MED ORDER — FENTANYL CITRATE (PF) 250 MCG/5ML IJ SOLN
INTRAMUSCULAR | Status: DC | PRN
  Administered 2020-01-21 (×3): 25 ug via INTRAVENOUS
  Administered 2020-01-21: 50 ug via INTRAVENOUS

## 2020-01-21 MED ORDER — ROCURONIUM BROMIDE 10 MG/ML (PF) SYRINGE
PREFILLED_SYRINGE | INTRAVENOUS | Status: DC | PRN
  Administered 2020-01-21: 20 mg via INTRAVENOUS
  Administered 2020-01-21: 40 mg via INTRAVENOUS

## 2020-01-21 MED ORDER — INDOMETHACIN 50 MG RE SUPP
RECTAL | Status: DC | PRN
  Administered 2020-01-21: 100 mg via RECTAL

## 2020-01-21 MED ORDER — SODIUM CHLORIDE 0.9 % IV SOLN
INTRAVENOUS | Status: DC | PRN
  Administered 2020-01-21: 25 mL

## 2020-01-21 MED ORDER — SUGAMMADEX SODIUM 200 MG/2ML IV SOLN
INTRAVENOUS | Status: DC | PRN
  Administered 2020-01-21: 200 mg via INTRAVENOUS

## 2020-01-21 MED ORDER — PROPOFOL 10 MG/ML IV BOLUS
INTRAVENOUS | Status: DC | PRN
  Administered 2020-01-21: 120 mg via INTRAVENOUS

## 2020-01-21 MED ORDER — LACTATED RINGERS IV SOLN: INTRAVENOUS | Status: DC | PRN

## 2020-01-21 MED ORDER — GLUCAGON HCL RDNA (DIAGNOSTIC) 1 MG IJ SOLR
INTRAMUSCULAR | Status: DC | PRN
  Administered 2020-01-21 (×2): .5 mg via INTRAVENOUS

## 2020-01-21 MED ORDER — MIDAZOLAM HCL 2 MG/2ML IJ SOLN
INTRAMUSCULAR | Status: DC | PRN
  Administered 2020-01-21: 2 mg via INTRAVENOUS

## 2020-01-21 MED ORDER — GLUCAGON HCL RDNA (DIAGNOSTIC) 1 MG IJ SOLR
INTRAMUSCULAR | Status: AC
  Filled 2020-01-21: qty 1

## 2020-01-21 MED ORDER — ONDANSETRON HCL 4 MG/2ML IJ SOLN
INTRAMUSCULAR | Status: DC | PRN
  Administered 2020-01-21: 4 mg via INTRAVENOUS

## 2020-01-21 MED ORDER — INDOMETHACIN 50 MG RE SUPP
RECTAL | Status: AC
  Filled 2020-01-21: qty 2

## 2020-01-21 NOTE — Brief Op Note (Signed)
01/20/2020 - 01/21/2020  9:54 AM  PATIENT:  Pamela Whitehead  69 y.o. female  PRE-OPERATIVE DIAGNOSIS:  choledocholithiasis  POST-OPERATIVE DIAGNOSIS:  ERCP w/ sphinct. Removal of 6 stones; pancreatic stent placed  PROCEDURE:  Procedure(s): ENDOSCOPIC RETROGRADE CHOLANGIOPANCREATOGRAPHY (ERCP) (N/A) PANCREATIC STENT PLACEMENT SPHINCTEROTOMY REMOVAL OF STONES  SURGEON:  Surgeon(s) and Role:    Ronnette Juniper, MD - Primary  PHYSICIAN ASSISTANT:   ASSISTANTS: Tory Emerald, Cherylynn Ridges, Tech, Jerene Dilling  ANESTHESIA:   MAC  EBL:  1 mL   BLOOD ADMINISTERED:none  DRAINS: none   LOCAL MEDICATIONS USED:  NONE  SPECIMEN:  No Specimen  DISPOSITION OF SPECIMEN:  N/A  COUNTS:  YES  TOURNIQUET:  * No tourniquets in log *  DICTATION: .Dragon Dictation  PLAN OF CARE: Admit to inpatient   PATIENT DISPOSITION:  PACU - hemodynamically stable.   Delay start of Pharmacological VTE agent (>24hrs) due to surgical blood loss or risk of bleeding: not applicable

## 2020-01-21 NOTE — Transfer of Care (Signed)
Immediate Anesthesia Transfer of Care Note  Patient: Pamela Whitehead  Procedure(s) Performed: ENDOSCOPIC RETROGRADE CHOLANGIOPANCREATOGRAPHY (ERCP) (N/A ) PANCREATIC STENT PLACEMENT SPHINCTEROTOMY REMOVAL OF STONES  Patient Location: PACU  Anesthesia Type:General  Level of Consciousness: awake, alert  and oriented  Airway & Oxygen Therapy: Patient Spontanous Breathing  Post-op Assessment: Report given to RN and Post -op Vital signs reviewed and stable  Post vital signs: Reviewed and stable  Last Vitals:  Vitals Value Taken Time  BP 106/62 01/21/20 1005  Temp 36.5 C 01/21/20 1005  Pulse 84 01/21/20 1009  Resp 16 01/21/20 1009  SpO2 99 % 01/21/20 1009  Vitals shown include unvalidated device data.  Last Pain:  Vitals:   01/21/20 1005  TempSrc:   PainSc: Asleep      Patients Stated Pain Goal: 3 (62/70/35 0093)  Complications: No complications documented.

## 2020-01-21 NOTE — Anesthesia Postprocedure Evaluation (Signed)
Anesthesia Post Note  Patient: Pamela Whitehead  Procedure(s) Performed: ENDOSCOPIC RETROGRADE CHOLANGIOPANCREATOGRAPHY (ERCP) (N/A ) PANCREATIC STENT PLACEMENT SPHINCTEROTOMY REMOVAL OF STONES     Patient location during evaluation: PACU Anesthesia Type: General Level of consciousness: sedated and patient cooperative Pain management: pain level controlled Vital Signs Assessment: post-procedure vital signs reviewed and stable Respiratory status: spontaneous breathing Cardiovascular status: stable Anesthetic complications: no   No complications documented.  Last Vitals:  Vitals:   01/21/20 1020 01/21/20 1039  BP: 127/68 133/61  Pulse: 82 74  Resp: 15 14  Temp: 36.5 C 36.8 C  SpO2: 98% 94%    Last Pain:  Vitals:   01/21/20 1039  TempSrc: Oral  PainSc: 7    Pain Goal: Patients Stated Pain Goal: 3 (01/20/20 0630)                 Nolon Nations

## 2020-01-21 NOTE — Op Note (Signed)
[facility] Patient Name: Pamela Whitehead Procedure Date : 01/21/2020 MRN: [id number] Attending MD: Ronnette Juniper , MD Date of Birth: [birth year] CSN: [id number] Age: 69 Admit Type: Inpatient Procedure:                ERCP Indications:              Filling defect on intraoperative cholangiogram Providers:                Ronnette Juniper, MD, Grace Isaac, RN, Erenest Rasher,                            RN, Cherylynn Ridges, Technician Referring MD:             CCS Medicines:                Monitored Anesthesia Care Complications:            No immediate complications. Estimated blood loss:                            Minimal. Estimated Blood Loss:     Estimated blood loss was minimal. Procedure:                Pre-Anesthesia Assessment:                           - Prior to the procedure, a History and Physical                            was performed, and patient medications and                            allergies were reviewed. The patient's tolerance of                            previous anesthesia was also reviewed. The risks                            and benefits of the procedure and the sedation                            options and risks were discussed with the patient.                            All questions were answered, and informed consent                            was obtained. Prior Anticoagulants: The patient has                            taken no previous anticoagulant or antiplatelet                            agents. ASA Grade Assessment: II - A patient with  mild systemic disease. After reviewing the risks                            and benefits, the patient was deemed in                            satisfactory condition to undergo the procedure.                           After obtaining informed consent, the scope was                            passed under direct vision. Throughout the                            procedure, the  patient's blood pressure, pulse, and                            oxygen saturations were monitored continuously. The                            TJF- Q180V ([id number]) Olympus duodenoscope was                            introduced through the mouth, and used to inject                            contrast into and used to inject contrast into the                            bile duct. The ERCP was technically difficult and                            complex due to challenging cannulation. Successful                            completion of the procedure was aided by changing                            the patient's position.She was repositioned from                            prone to left lateral and back to prone position                            again. The patient tolerated the procedure well. Scope In: Scope Out: Findings:      The scout film was normal. The esophagus was successfully intubated       under direct vision. The scope was advanced to a normal major papilla in       the descending duodenum without detailed examination of the pharynx,       larynx and associated structures, and upper GI tract. The upper GI tract       was grossly normal.  Standard sphinctertome was exhanged for a tapered sphinctertome and the       wire was exchanged, a stiff wire was used for canulation.      Canulation was extremely challenging as the ampulla was buldging and the       ampullary opening was downward facing. The patient was positioned from       prone to left lateral side, after canulation was repositioned to       original prone position.      On deep canulation, the wire was noted in the pancreatic duct.       Subsequently, one 4 Fr by 3 cm plastic stent with a single external       pigtail and no internal flaps was placed 2.5 cm into the ventral       pancreatic duct. Fluid flowed through the stent. The stent was in good       position.      Thereafter, the bile duct was deeply  cannulated with the tapered       sphincterotome. Contrast was injected. I personally interpreted the bile       duct images. There was brisk flow of contrast through the ducts. Image       quality was excellent. Contrast extended to the main bile duct. The       lower third of the main bile duct contained multiple stones, the largest       of which was 8 mm in diameter. The main bile duct was moderately       dilated. The largest diameter was 10 mm. A cholecystectomy had been       performed. A straight Roadrunner wire was passed into the biliary tree.       A 10 mm biliary sphincterotomy was made with a braided tapered       sphincterotome using ERBE electrocautery. There was no       post-sphincterotomy bleeding. The biliary tree was swept with a 12 mm       balloon starting at the bifurcation. A total of 6 stones were removed.       No stones remained.      A large amount of air bubbles was noted within the CBD post       sphincterotomy and balloon sweep. After several balloon sweeps, there       were no stone remaining and excellent bile flow was noted.      The patient was given rectal indomethacin at the end of the procedure. Impression:               - The entire main bile duct was moderately dilated.                           - The patient has had a cholecystectomy.                           - Choledocholithiasis was found. Complete removal                            was accomplished by biliary sphincterotomy and                            balloon extraction.                           -  A biliary sphincterotomy was performed.                           - The biliary tree was swept.                           - One plastic stent was placed into the ventral                            pancreatic duct. Moderate Sedation:      Patient did not receive moderate sedation for this procedure, but       instead received monitored anesthesia care. Recommendation:           - Advance diet as  tolerated.                           - Abodminal Xray needs to done in 2-3 weeks as an                            outpatient. If the pancreatic duct stent has not                            fallen off spontaneously, will arrange for                            outpatient EGD for removal of pancreatic duct stent. Procedure Code(s):        --- Professional ---                           [phone], Endoscopic retrograde                            cholangiopancreatography (ERCP); with placement of                            endoscopic stent into biliary or pancreatic duct,                            including pre- and post-dilation and guide wire                            passage, when performed, including sphincterotomy,                            when performed, each stent                           43264, Endoscopic retrograde                            cholangiopancreatography (ERCP); with removal of                            calculi/debris from biliary/pancreatic duct(s)  00370, 59, Endoscopic retrograde                            cholangiopancreatography (ERCP); with                            sphincterotomy/papillotomy Diagnosis Code(s):        --- Professional ---                           Z90.49, Acquired absence of other specified parts                            of digestive tract                           K80.50, Calculus of bile duct without cholangitis                            or cholecystitis without obstruction                           K83.8, Other specified diseases of biliary tract                           R93.2, Abnormal findings on diagnostic imaging of                            liver and biliary tract CPT copyright 2019 American Medical Association. All rights reserved. The codes documented in this report are preliminary and upon coder review may  be revised to meet current compliance requirements. Ronnette Juniper, MD 01/21/2020 9:53:21 AM This  report has been signed electronically. Number of Addenda: 0

## 2020-01-21 NOTE — Anesthesia Procedure Notes (Signed)
Procedure Name: Intubation Date/Time: 01/21/2020 8:04 AM Performed by: Clearnce Sorrel, CRNA Pre-anesthesia Checklist: Patient identified, Emergency Drugs available, Suction available, Patient being monitored and Timeout performed Patient Re-evaluated:Patient Re-evaluated prior to induction Oxygen Delivery Method: Circle system utilized Preoxygenation: Pre-oxygenation with 100% oxygen Induction Type: IV induction Ventilation: Mask ventilation without difficulty Laryngoscope Size: Mac and 3 Tube type: Oral Tube size: 7.0 mm Number of attempts: 1 Airway Equipment and Method: Stylet Placement Confirmation: ETT inserted through vocal cords under direct vision,  positive ETCO2 and breath sounds checked- equal and bilateral Secured at: 22 cm Tube secured with: Tape Dental Injury: Teeth and Oropharynx as per pre-operative assessment

## 2020-01-21 NOTE — Interval H&P Note (Signed)
History and Physical Interval Note: 69/female with a positive intraoperative cholangiogram for an ERCP today.  01/21/2020 7:56 AM  Pamela Whitehead  has presented today for ERCP, with the diagnosis of choledocholithiasis.  The various methods of treatment have been discussed with the patient and family. After consideration of risks, benefits and other options for treatment, the patient has consented to  Procedure(s): ENDOSCOPIC RETROGRADE CHOLANGIOPANCREATOGRAPHY (ERCP) (N/A) as a surgical intervention.  The patient's history has been reviewed, patient examined, no change in status, stable for surgery.  I have reviewed the patient's chart and labs.  Questions were answered to the patient's satisfaction.     Ronnette Juniper

## 2020-01-21 NOTE — Progress Notes (Signed)
Patient ID: Pamela Whitehead, female   DOB: 24-Nov-1950, 69 y.o.   MRN: [id number] [facility] Progress Note:   Day of Surgery  Subjective: Mental status is awakening from ERCP doing well.  Complaints some pain in abdomen. Objective: Vital signs in last 24 hours: Temp:  [97 F (36.1 C)-98.8 F (37.1 C)] 97.7 F (36.5 C) (11/13 1020) Pulse Rate:  [52-86] 82 (11/13 1020) Resp:  [10-26] 15 (11/13 1020) BP: (106-140)/(51-77) 127/68 (11/13 1020) SpO2:  [95 %-100 %] 98 % (11/13 1020)  Intake/Output from previous day: 11/12 0701 - 11/13 0700 In: 2430.4 [I.V.:2230.4; IV Piggyback:200] Out: 560 [Urine:550; Blood:10] Intake/Output this shift: Total I/O In: 900 [I.V.:900] Out: 1 [Blood:1]  Physical Exam: Work of breathing is not labored.  Sore in abdomen  Lab Results:  Results for orders placed or performed during the hospital encounter of 01/20/20 (from the past 48 hour(s))  ABO/Rh     Status: None   Collection Time: 01/20/20  7:00 AM  Result Value Ref Range   ABO/RH(D)      A POS Performed at [facility] [address], [place], [place] [address]   CBC with Differential/Platelet     Status: Abnormal   Collection Time: 01/20/20  3:17 PM  Result Value Ref Range   WBC 12.5 (H) 4.0 - 10.5 K/uL   RBC 3.99 3.87 - 5.11 MIL/uL   Hemoglobin 12.6 12.0 - 15.0 g/dL   HCT 37.6 36 - 46 %   MCV 94.2 80.0 - 100.0 fL   MCH 31.6 26.0 - 34.0 pg   MCHC 33.5 30.0 - 36.0 g/dL   RDW 12.4 11.5 - 15.5 %   Platelets 298 150 - 400 K/uL   nRBC 0.0 0.0 - 0.2 %   Neutrophils Relative % 92 %   Neutro Abs 11.4 (H) 1.7 - 7.7 K/uL   Lymphocytes Relative 5 %   Lymphs Abs 0.6 (L) 0.7 - 4.0 K/uL   Monocytes Relative 3 %   Monocytes Absolute 0.3 0.1 - 1.0 K/uL   Eosinophils Relative 0 %   Eosinophils Absolute 0.0 0.0 - 0.5 K/uL   Basophils Relative 0 %   Basophils Absolute 0.0 0.0 - 0.1 K/uL   Immature Granulocytes 0 %   Abs Immature Granulocytes 0.05 0.00 - 0.07 K/uL    Comment:  Performed at [facility] [address], [place], [place] [address]  Comprehensive metabolic panel     Status: Abnormal   Collection Time: 01/20/20  3:17 PM  Result Value Ref Range   Sodium 138 135 - 145 mmol/L   Potassium 4.1 3.5 - 5.1 mmol/L   Chloride 101 98 - 111 mmol/L   CO2 26 22 - 32 mmol/L   Glucose, Bld 129 (H) 70 - 99 mg/dL    Comment: Glucose reference range applies only to samples taken after fasting for at least 8 hours.   BUN 20 8 - 23 mg/dL   Creatinine, Ser 0.95 0.44 - 1.00 mg/dL   Calcium 9.2 8.9 - 10.3 mg/dL   Total Protein 5.9 (L) 6.5 - 8.1 g/dL   Albumin 3.4 (L) 3.5 - 5.0 g/dL   AST 47 (H) 15 - 41 U/L   ALT 65 (H) 0 - 44 U/L   Alkaline Phosphatase 229 (H) 38 - 126 U/L   Total Bilirubin 0.9 0.3 - 1.2 mg/dL   GFR, Estimated >60 >60 mL/min    Comment: (NOTE) Calculated using the CKD-EPI Creatinine Equation (2021)  Anion gap 11 5 - 15    Comment: Performed at [facility] [address] [place], [place] [address]  CBC with Differential/Platelet     Status: Abnormal   Collection Time: 01/21/20  1:27 AM  Result Value Ref Range   WBC 10.0 4.0 - 10.5 K/uL   RBC 3.80 (L) 3.87 - 5.11 MIL/uL   Hemoglobin 12.2 12.0 - 15.0 g/dL   HCT 36.6 36 - 46 %   MCV 96.3 80.0 - 100.0 fL   MCH 32.1 26.0 - 34.0 pg   MCHC 33.3 30.0 - 36.0 g/dL   RDW 12.6 11.5 - 15.5 %   Platelets 295 150 - 400 K/uL   nRBC 0.0 0.0 - 0.2 %   Neutrophils Relative % 72 %   Neutro Abs 7.2 1.7 - 7.7 K/uL   Lymphocytes Relative 18 %   Lymphs Abs 1.9 0.7 - 4.0 K/uL   Monocytes Relative 10 %   Monocytes Absolute 1.0 0.1 - 1.0 K/uL   Eosinophils Relative 0 %   Eosinophils Absolute 0.0 0.0 - 0.5 K/uL   Basophils Relative 0 %   Basophils Absolute 0.0 0.0 - 0.1 K/uL   Immature Granulocytes 0 %   Abs Immature Granulocytes 0.02 0.00 - 0.07 K/uL    Comment: Performed at [facility] [address], [place], [place] [address]  Comprehensive metabolic panel     Status: Abnormal    Collection Time: 01/21/20  1:27 AM  Result Value Ref Range   Sodium 140 135 - 145 mmol/L   Potassium 4.2 3.5 - 5.1 mmol/L   Chloride 106 98 - 111 mmol/L   CO2 26 22 - 32 mmol/L   Glucose, Bld 134 (H) 70 - 99 mg/dL    Comment: Glucose reference range applies only to samples taken after fasting for at least 8 hours.   BUN 17 8 - 23 mg/dL   Creatinine, Ser 1.27 (H) 0.44 - 1.00 mg/dL   Calcium 8.8 (L) 8.9 - 10.3 mg/dL   Total Protein 5.6 (L) 6.5 - 8.1 g/dL   Albumin 3.1 (L) 3.5 - 5.0 g/dL   AST 59 (H) 15 - 41 U/L   ALT 61 (H) 0 - 44 U/L   Alkaline Phosphatase 210 (H) 38 - 126 U/L   Total Bilirubin 0.8 0.3 - 1.2 mg/dL   GFR, Estimated 46 (L) >60 mL/min    Comment: (NOTE) Calculated using the CKD-EPI Creatinine Equation (2021)    Anion gap 8 5 - 15    Comment: Performed at [facility], [place] [address], [place], [place] [address]    Radiology/Results: DG Cholangiogram Operative  Result Date: 01/20/2020 CLINICAL DATA:  69 year old female with a history cholelithiasis EXAM: INTRAOPERATIVE CHOLANGIOGRAM TECHNIQUE: Cholangiographic images from the C-arm fluoroscopic device were submitted for interpretation post-operatively. Please see the procedural report for the amount of contrast and the fluoroscopy time utilized. COMPARISON:  None. FINDINGS: Surgical instruments project over the upper abdomen. There is cannulation of the cystic duct/gallbladder neck, with antegrade infusion of contrast. Caliber of the extrahepatic ductal system within normal limits. Geometric filling defects within the distal common bile duct. Free flow of contrast across the ampulla. IMPRESSION: Intraoperative cholangiogram demonstrates extrahepatic biliary ducts of unremarkable caliber, with choledocholithiasis. Correlation with ERCP may be indicated. Please refer to the dictated operative report for full details of intraoperative findings and procedure Electronically Signed   By: Corrie Mckusick D.O.   On: 01/20/2020  10:01  Anti-infectives: Anti-infectives (From admission, onward)   Start     Dose/Rate Route Frequency Ordered Stop   01/20/20 0630  ciprofloxacin (CIPRO) IVPB 400 mg        400 mg 200 mL/hr over 60 Minutes Intravenous On call to O.R. 01/20/20 0625 01/20/20 0745      Assessment/Plan: Problem List: Patient Active Problem List   Diagnosis Date Noted  . Gallstones 01/20/2020  . Microscopic hematuria 04/29/2011  . Hypertension   . Ulcer   . Anaphylactic reaction     Successful removal of multiple stones from CBD and pancreatic duct stent.  Will observe today and advance diet.  Hopeful discharge tomorrow.   Day of Surgery    LOS: 0 days   Matt B. Hassell Done, MD, [facility] [phone] to reach the surgeon on call.    01/21/2020 10:37 AM

## 2020-01-22 LAB — COMPREHENSIVE METABOLIC PANEL
ALT: 55 U/L — ABNORMAL HIGH (ref 0–44)
AST: 47 U/L — ABNORMAL HIGH (ref 15–41)
Albumin: 2.7 g/dL — ABNORMAL LOW (ref 3.5–5.0)
Alkaline Phosphatase: 165 U/L — ABNORMAL HIGH (ref 38–126)
Anion gap: 6 (ref 5–15)
BUN: 14 mg/dL (ref 8–23)
CO2: 24 mmol/L (ref 22–32)
Calcium: 8.4 mg/dL — ABNORMAL LOW (ref 8.9–10.3)
Chloride: 111 mmol/L (ref 98–111)
Creatinine, Ser: 0.95 mg/dL (ref 0.44–1.00)
GFR, Estimated: >60 mL/min (ref >60)
Glucose, Bld: 119 mg/dL — ABNORMAL HIGH (ref 70–99)
Potassium: 3.9 mmol/L (ref 3.5–5.1)
Sodium: 141 mmol/L (ref 135–145)
Total Bilirubin: 1 mg/dL (ref 0.3–1.2)
Total Protein: 4.9 g/dL — ABNORMAL LOW (ref 6.5–8.1)

## 2020-01-22 LAB — CBC WITH DIFFERENTIAL/PLATELET
Abs Immature Granulocytes: 0.03 10*3/uL (ref 0.00–0.07)
Basophils Absolute: 0 10*3/uL (ref 0.0–0.1)
Basophils Relative: 0 %
Eosinophils Absolute: 0 10*3/uL (ref 0.0–0.5)
Eosinophils Relative: 0 %
HCT: 31.6 % — ABNORMAL LOW (ref 36.0–46.0)
Hemoglobin: 10.4 g/dL — ABNORMAL LOW (ref 12.0–15.0)
Immature Granulocytes: 0 %
Lymphocytes Relative: 23 %
Lymphs Abs: 2.4 10*3/uL (ref 0.7–4.0)
MCH: 32.3 pg (ref 26.0–34.0)
MCHC: 32.9 g/dL (ref 30.0–36.0)
MCV: 98.1 fL (ref 80.0–100.0)
Monocytes Absolute: 1.2 10*3/uL — ABNORMAL HIGH (ref 0.1–1.0)
Monocytes Relative: 12 %
Neutro Abs: 6.6 10*3/uL (ref 1.7–7.7)
Neutrophils Relative %: 65 %
Platelets: 232 10*3/uL (ref 150–400)
RBC: 3.22 MIL/uL — ABNORMAL LOW (ref 3.87–5.11)
RDW: 12.6 % (ref 11.5–15.5)
WBC: 10.4 10*3/uL (ref 4.0–10.5)
nRBC: 0 % (ref 0.0–0.2)

## 2020-01-22 LAB — LIPASE, BLOOD: Lipase: 47 U/L (ref 11–51)

## 2020-01-22 MED ORDER — ACETAMINOPHEN 325 MG PO TABS
650.0000 mg | ORAL_TABLET | Freq: Four times a day (QID) | ORAL | Status: DC | PRN
  Administered 2020-01-22 – 2020-01-23 (×3): 650 mg via ORAL
  Filled 2020-01-22 (×4): qty 2

## 2020-01-22 NOTE — Progress Notes (Signed)
Subjective: Some mild abdominal soreness, otherwise no complaints.  Objective: Vital signs in last 24 hours: Temp:  [98 F (36.7 C)-98.8 F (37.1 C)] 98.8 F (37.1 C) (11/14 0459) Pulse Rate:  [73-82] 79 (11/14 0459) Resp:  [15-18] 18 (11/14 0459) BP: (132-141)/(63-75) 134/68 (11/14 0459) SpO2:  [95 %-98 %] 96 % (11/14 0459) Weight:  [74.8 kg] 74.8 kg (11/14 1006) Weight change:  Last BM Date: 01/20/20  PE: GEN:  NAD ABD:  Mild distended, mild periumbilical tenderness  Lab Results: CBC    Component Value Date/Time   WBC 10.4 01/22/2020 0300   RBC 3.22 (L) 01/22/2020 0300   HGB 10.4 (L) 01/22/2020 0300   HCT 31.6 (L) 01/22/2020 0300   PLT 232 01/22/2020 0300   MCV 98.1 01/22/2020 0300   MCH 32.3 01/22/2020 0300   MCHC 32.9 01/22/2020 0300   RDW 12.6 01/22/2020 0300   LYMPHSABS 2.4 01/22/2020 0300   MONOABS 1.2 (H) 01/22/2020 0300   EOSABS 0.0 01/22/2020 0300   BASOSABS 0.0 01/22/2020 0300   CMP     Component Value Date/Time   NA 141 01/22/2020 0300   K 3.9 01/22/2020 0300   CL 111 01/22/2020 0300   CO2 24 01/22/2020 0300   GLUCOSE 119 (H) 01/22/2020 0300   BUN 14 01/22/2020 0300   CREATININE 0.95 01/22/2020 0300   CREATININE 0.95 05/20/2013 0839   CALCIUM 8.4 (L) 01/22/2020 0300   PROT 4.9 (L) 01/22/2020 0300   ALBUMIN 2.7 (L) 01/22/2020 0300   AST 47 (H) 01/22/2020 0300   ALT 55 (H) 01/22/2020 0300   ALKPHOS 165 (H) 01/22/2020 0300   BILITOT 1.0 01/22/2020 0300   GFRNONAA >60 01/22/2020 0300   GFRAA >60 09/18/2019 1003   Assessment:  1.  Choledocholithiasis, ERCP with stone extraction with Dr. Therisa Doyne.  Doing well post-procedurally. 2.  Elevated LFTs, downtrending. 3.  POD 2, cholecystectomy and salpingoophorectomy.  Plan:  1.  Diet and post-operative management per surgical team. 2.  Nothing further to do from GI perspective at this time. 3.  Dr. Encarnacion Slates office to contact patient upon discharge to arrange outpatient abdominal xray in 3-4 weeks to  ensure spontaneous passage of the pancreatic duct stent which was placed during yesterday's procedure. 4.  [facility] will sign-off; please call with questions; thank you for the consultation.   Landry Dyke 01/22/2020, 12:23 PM   Cell [phone] If no answer or after 5 PM call [phone]

## 2020-01-22 NOTE — Plan of Care (Signed)
  Problem: Education: Goal: Knowledge of General Education information will improve Description: Including pain rating scale, medication(s)/side effects and non-pharmacologic comfort measures Outcome: Progressing   Problem: Health Behavior/Discharge Planning: Goal: Ability to manage health-related needs will improve Outcome: Progressing   Problem: Clinical Measurements: Goal: Ability to maintain clinical measurements within normal limits will improve Outcome: Progressing   Problem: Activity: Goal: Risk for activity intolerance will decrease Outcome: Progressing   Problem: Nutrition: Goal: Adequate nutrition will be maintained Outcome: Progressing   Problem: Pain Managment: Goal: General experience of comfort will improve Outcome: Progressing   Problem: Safety: Goal: Ability to remain free from injury will improve Outcome: Progressing   Problem: Skin Integrity: Goal: Risk for impaired skin integrity will decrease Outcome: Progressing   Problem: Clinical Measurements: Goal: Postoperative complications will be avoided or minimized Outcome: Progressing

## 2020-01-22 NOTE — Progress Notes (Signed)
Patient ID: Pamela Whitehead, female   DOB: 1950/06/13, 69 y.o.   MRN: [id number] [facility] Surgery Progress Note:   1 Day Post-Op  Subjective: Mental status is alert.  Complaints sore. Objective: Vital signs in last 24 hours: Temp:  [97.7 F (36.5 C)-98.8 F (37.1 C)] 98.8 F (37.1 C) (11/14 0459) Pulse Rate:  [73-86] 79 (11/14 0459) Resp:  [14-19] 18 (11/14 0459) BP: (106-141)/(61-75) 134/68 (11/14 0459) SpO2:  [94 %-100 %] 96 % (11/14 0459)  Intake/Output from previous day: 11/13 0701 - 11/14 0700 In: 1609.2 [P.O.:120; I.V.:1489.2] Out: 1 [Blood:1] Intake/Output this shift: No intake/output data recorded.  Physical Exam: Work of breathing is not labored.  Abdomen is appropriately sore from surgery.    Lab Results:  Results for orders placed or performed during the hospital encounter of 01/20/20 (from the past 48 hour(s))  CBC with Differential/Platelet     Status: Abnormal   Collection Time: 01/20/20  3:17 PM  Result Value Ref Range   WBC 12.5 (H) 4.0 - 10.5 K/uL   RBC 3.99 3.87 - 5.11 MIL/uL   Hemoglobin 12.6 12.0 - 15.0 g/dL   HCT 37.6 36 - 46 %   MCV 94.2 80.0 - 100.0 fL   MCH 31.6 26.0 - 34.0 pg   MCHC 33.5 30.0 - 36.0 g/dL   RDW 12.4 11.5 - 15.5 %   Platelets 298 150 - 400 K/uL   nRBC 0.0 0.0 - 0.2 %   Neutrophils Relative % 92 %   Neutro Abs 11.4 (H) 1.7 - 7.7 K/uL   Lymphocytes Relative 5 %   Lymphs Abs 0.6 (L) 0.7 - 4.0 K/uL   Monocytes Relative 3 %   Monocytes Absolute 0.3 0.1 - 1.0 K/uL   Eosinophils Relative 0 %   Eosinophils Absolute 0.0 0.0 - 0.5 K/uL   Basophils Relative 0 %   Basophils Absolute 0.0 0.0 - 0.1 K/uL   Immature Granulocytes 0 %   Abs Immature Granulocytes 0.05 0.00 - 0.07 K/uL    Comment: Performed at [facility], [address] [place], [place] [address]  Comprehensive metabolic panel     Status: Abnormal   Collection Time: 01/20/20  3:17 PM  Result Value Ref Range   Sodium 138 135 - 145 mmol/L   Potassium 4.1 3.5  - 5.1 mmol/L   Chloride 101 98 - 111 mmol/L   CO2 26 22 - 32 mmol/L   Glucose, Bld 129 (H) 70 - 99 mg/dL    Comment: Glucose reference range applies only to samples taken after fasting for at least 8 hours.   BUN 20 8 - 23 mg/dL   Creatinine, Ser 0.95 0.44 - 1.00 mg/dL   Calcium 9.2 8.9 - 10.3 mg/dL   Total Protein 5.9 (L) 6.5 - 8.1 g/dL   Albumin 3.4 (L) 3.5 - 5.0 g/dL   AST 47 (H) 15 - 41 U/L   ALT 65 (H) 0 - 44 U/L   Alkaline Phosphatase 229 (H) 38 - 126 U/L   Total Bilirubin 0.9 0.3 - 1.2 mg/dL   GFR, Estimated >60 >60 mL/min    Comment: (NOTE) Calculated using the CKD-EPI Creatinine Equation (2021)    Anion gap 11 5 - 15    Comment: Performed at [facility], [place] [address], [place], [place] [address]  CBC with Differential/Platelet     Status: Abnormal   Collection Time: 01/21/20  1:27 AM  Result Value Ref Range   WBC 10.0  4.0 - 10.5 K/uL   RBC 3.80 (L) 3.87 - 5.11 MIL/uL   Hemoglobin 12.2 12.0 - 15.0 g/dL   HCT 36.6 36 - 46 %   MCV 96.3 80.0 - 100.0 fL   MCH 32.1 26.0 - 34.0 pg   MCHC 33.3 30.0 - 36.0 g/dL   RDW 12.6 11.5 - 15.5 %   Platelets 295 150 - 400 K/uL   nRBC 0.0 0.0 - 0.2 %   Neutrophils Relative % 72 %   Neutro Abs 7.2 1.7 - 7.7 K/uL   Lymphocytes Relative 18 %   Lymphs Abs 1.9 0.7 - 4.0 K/uL   Monocytes Relative 10 %   Monocytes Absolute 1.0 0.1 - 1.0 K/uL   Eosinophils Relative 0 %   Eosinophils Absolute 0.0 0.0 - 0.5 K/uL   Basophils Relative 0 %   Basophils Absolute 0.0 0.0 - 0.1 K/uL   Immature Granulocytes 0 %   Abs Immature Granulocytes 0.02 0.00 - 0.07 K/uL    Comment: Performed at [place] [address] [place], [place] [address]  Comprehensive metabolic panel     Status: Abnormal   Collection Time: 01/21/20  1:27 AM  Result Value Ref Range   Sodium 140 135 - 145 mmol/L   Potassium 4.2 3.5 - 5.1 mmol/L   Chloride 106 98 - 111 mmol/L   CO2 26 22 - 32 mmol/L   Glucose, Bld 134 (H) 70 - 99 mg/dL    Comment: Glucose  reference range applies only to samples taken after fasting for at least 8 hours.   BUN 17 8 - 23 mg/dL   Creatinine, Ser 1.27 (H) 0.44 - 1.00 mg/dL   Calcium 8.8 (L) 8.9 - 10.3 mg/dL   Total Protein 5.6 (L) 6.5 - 8.1 g/dL   Albumin 3.1 (L) 3.5 - 5.0 g/dL   AST 59 (H) 15 - 41 U/L   ALT 61 (H) 0 - 44 U/L   Alkaline Phosphatase 210 (H) 38 - 126 U/L   Total Bilirubin 0.8 0.3 - 1.2 mg/dL   GFR, Estimated 46 (L) >60 mL/min    Comment: (NOTE) Calculated using the CKD-EPI Creatinine Equation (2021)    Anion gap 8 5 - 15    Comment: Performed at [facility], [place] [address], [place], [place] [address]  CBC with Differential/Platelet     Status: Abnormal   Collection Time: 01/22/20  3:00 AM  Result Value Ref Range   WBC 10.4 4.0 - 10.5 K/uL   RBC 3.22 (L) 3.87 - 5.11 MIL/uL   Hemoglobin 10.4 (L) 12.0 - 15.0 g/dL   HCT 31.6 (L) 36 - 46 %   MCV 98.1 80.0 - 100.0 fL   MCH 32.3 26.0 - 34.0 pg   MCHC 32.9 30.0 - 36.0 g/dL   RDW 12.6 11.5 - 15.5 %   Platelets 232 150 - 400 K/uL   nRBC 0.0 0.0 - 0.2 %   Neutrophils Relative % 65 %   Neutro Abs 6.6 1.7 - 7.7 K/uL   Lymphocytes Relative 23 %   Lymphs Abs 2.4 0.7 - 4.0 K/uL   Monocytes Relative 12 %   Monocytes Absolute 1.2 (H) 0.1 - 1.0 K/uL   Eosinophils Relative 0 %   Eosinophils Absolute 0.0 0.0 - 0.5 K/uL   Basophils Relative 0 %   Basophils Absolute 0.0 0.0 - 0.1 K/uL   Immature Granulocytes 0 %   Abs Immature Granulocytes 0.03 0.00 - 0.07 K/uL  Comment: Performed at [facility], [place] [address] [place], [place] [address]  Comprehensive metabolic panel     Status: Abnormal   Collection Time: 01/22/20  3:00 AM  Result Value Ref Range   Sodium 141 135 - 145 mmol/L   Potassium 3.9 3.5 - 5.1 mmol/L   Chloride 111 98 - 111 mmol/L   CO2 24 22 - 32 mmol/L   Glucose, Bld 119 (H) 70 - 99 mg/dL    Comment: Glucose reference range applies only to samples taken after fasting for at least 8 hours.   BUN 14 8 - 23 mg/dL    Creatinine, Ser 0.95 0.44 - 1.00 mg/dL   Calcium 8.4 (L) 8.9 - 10.3 mg/dL   Total Protein 4.9 (L) 6.5 - 8.1 g/dL   Albumin 2.7 (L) 3.5 - 5.0 g/dL   AST 47 (H) 15 - 41 U/L   ALT 55 (H) 0 - 44 U/L   Alkaline Phosphatase 165 (H) 38 - 126 U/L   Total Bilirubin 1.0 0.3 - 1.2 mg/dL   GFR, Estimated >60 >60 mL/min    Comment: (NOTE) Calculated using the CKD-EPI Creatinine Equation (2021)    Anion gap 6 5 - 15    Comment: Performed at [facility], [place] [address], [place], [place] [address]  Lipase, blood     Status: None   Collection Time: 01/22/20  3:00 AM  Result Value Ref Range   Lipase 47 11 - 51 U/L    Comment: Performed at [place] [address], [place], [place] [address]    Radiology/Results: DG Cholangiogram Operative  Result Date: 01/20/2020 CLINICAL DATA:  70 year old female with a history cholelithiasis EXAM: INTRAOPERATIVE CHOLANGIOGRAM TECHNIQUE: Cholangiographic images from the C-arm fluoroscopic device were submitted for interpretation post-operatively. Please see the procedural report for the amount of contrast and the fluoroscopy time utilized. COMPARISON:  None. FINDINGS: Surgical instruments project over the upper abdomen. There is cannulation of the cystic duct/gallbladder neck, with antegrade infusion of contrast. Caliber of the extrahepatic ductal system within normal limits. Geometric filling defects within the distal common bile duct. Free flow of contrast across the ampulla. IMPRESSION: Intraoperative cholangiogram demonstrates extrahepatic biliary ducts of unremarkable caliber, with choledocholithiasis. Correlation with ERCP may be indicated. Please refer to the dictated operative report for full details of intraoperative findings and procedure Electronically Signed   By: Corrie Mckusick D.O.   On: 01/20/2020 10:01   DG ERCP BILIARY & PANCREATIC DUCTS  Result Date: 01/21/2020 CLINICAL DATA:  Choledocholithiasis. EXAM: ERCP TECHNIQUE: Multiple spot  images obtained with the fluoroscopic device and submitted for interpretation post-procedure. COMPARISON:  Intraoperative cholangiogram on 01/20/2020 FINDINGS: Imaging obtained with a C-arm during the endoscopic procedure demonstrates cannulation of the pancreatic duct with placement of a pancreatic ductal stent. Subsequent imaging demonstrates injection of contrast material into the common bile duct which demonstrates moderate dilatation and some filling defects. Balloon sweep maneuver was performed to extract calculi. IMPRESSION: Choledocholithiasis with extraction of calculi from the common bile duct. A pancreatic ductal stent was also placed. These images were submitted for radiologic interpretation only. Please see the procedural report for the amount of contrast and the fluoroscopy time utilized. Electronically Signed   By: Aletta Edouard M.D.   On: 01/21/2020 11:20    Anti-infectives: Anti-infectives (From admission, onward)   Start     Dose/Rate Route Frequency Ordered Stop   01/20/20 0630  ciprofloxacin (CIPRO) IVPB 400 mg  400 mg 200 mL/hr over 60 Minutes Intravenous On call to O.R. 01/20/20 0625 01/20/20 0745      Assessment/Plan: Problem List: Patient Active Problem List   Diagnosis Date Noted  . Gallstones 01/20/2020  . Microscopic hematuria 04/29/2011  . Hypertension   . Ulcer   . Anaphylactic reaction     Needs to take diet and walk.  Probable discharge tomorrow.  Lab looks OK and lipase normal.   1 Day Post-Op    LOS: 1 day   Matt B. Hassell Done, MD, [facility] [phone] to reach the surgeon on call.    01/22/2020 9:25 AM

## 2020-01-23 ENCOUNTER — Encounter (HOSPITAL_COMMUNITY): Payer: Self-pay | Admitting: Gastroenterology

## 2020-01-23 LAB — COMPREHENSIVE METABOLIC PANEL
ALT: 44 U/L (ref 0–44)
ALT: 41 U/L (ref 0–44)
AST: 34 U/L (ref 15–41)
AST: 31 U/L (ref 15–41)
Albumin: 2.6 g/dL — ABNORMAL LOW (ref 3.5–5.0)
Albumin: 2.7 g/dL — ABNORMAL LOW (ref 3.5–5.0)
Alkaline Phosphatase: 177 U/L — ABNORMAL HIGH (ref 38–126)
Alkaline Phosphatase: 200 U/L — ABNORMAL HIGH (ref 38–126)
Anion gap: 7 (ref 5–15)
Anion gap: 9 (ref 5–15)
BUN: 8 mg/dL (ref 8–23)
BUN: 9 mg/dL (ref 8–23)
CO2: 24 mmol/L (ref 22–32)
CO2: 24 mmol/L (ref 22–32)
Calcium: 8.4 mg/dL — ABNORMAL LOW (ref 8.9–10.3)
Calcium: 8.5 mg/dL — ABNORMAL LOW (ref 8.9–10.3)
Chloride: 107 mmol/L (ref 98–111)
Chloride: 103 mmol/L (ref 98–111)
Creatinine, Ser: 0.75 mg/dL (ref 0.44–1.00)
Creatinine, Ser: 0.76 mg/dL (ref 0.44–1.00)
GFR, Estimated: >60 mL/min (ref >60)
GFR, Estimated: >60 mL/min (ref >60)
Glucose, Bld: 104 mg/dL — ABNORMAL HIGH (ref 70–99)
Glucose, Bld: 111 mg/dL — ABNORMAL HIGH (ref 70–99)
Potassium: 3.8 mmol/L (ref 3.5–5.1)
Potassium: 3.4 mmol/L — ABNORMAL LOW (ref 3.5–5.1)
Sodium: 138 mmol/L (ref 135–145)
Sodium: 136 mmol/L (ref 135–145)
Total Bilirubin: 1.3 mg/dL — ABNORMAL HIGH (ref 0.3–1.2)
Total Bilirubin: 1.4 mg/dL — ABNORMAL HIGH (ref 0.3–1.2)
Total Protein: 4.9 g/dL — ABNORMAL LOW (ref 6.5–8.1)
Total Protein: 5.1 g/dL — ABNORMAL LOW (ref 6.5–8.1)

## 2020-01-23 LAB — LIPASE, BLOOD: Lipase: 24 U/L (ref 11–51)

## 2020-01-23 LAB — CBC WITH DIFFERENTIAL/PLATELET
Abs Immature Granulocytes: 0.03 10*3/uL (ref 0.00–0.07)
Basophils Absolute: 0 10*3/uL (ref 0.0–0.1)
Basophils Relative: 1 %
Eosinophils Absolute: 0.1 10*3/uL (ref 0.0–0.5)
Eosinophils Relative: 1 %
HCT: 33.2 % — ABNORMAL LOW (ref 36.0–46.0)
Hemoglobin: 10.8 g/dL — ABNORMAL LOW (ref 12.0–15.0)
Immature Granulocytes: 0 %
Lymphocytes Relative: 18 %
Lymphs Abs: 1.4 10*3/uL (ref 0.7–4.0)
MCH: 31.5 pg (ref 26.0–34.0)
MCHC: 32.5 g/dL (ref 30.0–36.0)
MCV: 96.8 fL (ref 80.0–100.0)
Monocytes Absolute: 0.9 10*3/uL (ref 0.1–1.0)
Monocytes Relative: 11 %
Neutro Abs: 5.5 10*3/uL (ref 1.7–7.7)
Neutrophils Relative %: 69 %
Platelets: 232 10*3/uL (ref 150–400)
RBC: 3.43 MIL/uL — ABNORMAL LOW (ref 3.87–5.11)
RDW: 12.4 % (ref 11.5–15.5)
WBC: 8 10*3/uL (ref 4.0–10.5)
nRBC: 0 % (ref 0.0–0.2)

## 2020-01-23 LAB — SURGICAL PATHOLOGY

## 2020-01-23 MED ORDER — MORPHINE SULFATE (PF) 2 MG/ML IV SOLN
2.0000 mg | INTRAVENOUS | Status: DC | PRN
  Administered 2020-01-23 – 2020-01-25 (×6): 2 mg via INTRAVENOUS
  Filled 2020-01-23 (×6): qty 1

## 2020-01-23 MED ORDER — HYDROCODONE-ACETAMINOPHEN 5-325 MG PO TABS
1.0000 | ORAL_TABLET | ORAL | Status: DC | PRN
  Administered 2020-01-23 – 2020-01-24 (×4): 2 via ORAL
  Administered 2020-01-24: 1 via ORAL
  Administered 2020-01-25 – 2020-01-26 (×3): 2 via ORAL
  Filled 2020-01-23 (×7): qty 2
  Filled 2020-01-23 (×2): qty 1
  Filled 2020-01-23: qty 2

## 2020-01-23 MED ORDER — PANTOPRAZOLE SODIUM 40 MG PO TBEC
40.0000 mg | DELAYED_RELEASE_TABLET | Freq: Every day | ORAL | Status: DC
  Administered 2020-01-23 – 2020-01-25 (×2): 40 mg via ORAL
  Filled 2020-01-23 (×3): qty 1

## 2020-01-23 NOTE — Plan of Care (Signed)
  Problem: Education: Goal: Knowledge of General Education information will improve Description: Including pain rating scale, medication(s)/side effects and non-pharmacologic comfort measures 01/23/2020 2346 by Heloise Purpura, RN Outcome: Progressing 01/23/2020 2343 by Heloise Purpura, RN Outcome: Progressing

## 2020-01-23 NOTE — Plan of Care (Signed)
  Problem: Education: Goal: Knowledge of General Education information will improve Description: Including pain rating scale, medication(s)/side effects and non-pharmacologic comfort measures Outcome: Progressing   Problem: Health Behavior/Discharge Planning: Goal: Ability to manage health-related needs will improve Outcome: Progressing   Problem: Clinical Measurements: Goal: Ability to maintain clinical measurements within normal limits will improve Outcome: Progressing Goal: Respiratory complications will improve Outcome: Progressing   Problem: Activity: Goal: Risk for activity intolerance will decrease Outcome: Progressing   Problem: Nutrition: Goal: Adequate nutrition will be maintained Outcome: Progressing   Problem: Pain Managment: Goal: General experience of comfort will improve Outcome: Progressing   Problem: Safety: Goal: Ability to remain free from injury will improve Outcome: Progressing

## 2020-01-23 NOTE — Plan of Care (Signed)
  Problem: Education: Goal: Knowledge of General Education information will improve Description Including pain rating scale, medication(s)/side effects and non-pharmacologic comfort measures Outcome: Progressing   

## 2020-01-23 NOTE — Progress Notes (Signed)
2 Days Post-Op   Subjective/Chief Complaint: Complains of RUQ pain that comes and goes   Objective: Vital signs in last 24 hours: Temp:  [98.3 F (36.8 C)-98.9 F (37.2 C)] 98.3 F (36.8 C) (11/15 1013) Pulse Rate:  [74-90] 90 (11/15 1419) Resp:  [18-20] 18 (11/15 1419) BP: (135-174)/(74-80) 135/74 (11/15 1419) SpO2:  [95 %-98 %] 97 % (11/15 1419) Last BM Date: 01/20/20  Intake/Output from previous day: 11/14 0701 - 11/15 0700 In: 120 [P.O.:120] Out: -  Intake/Output this shift: Total I/O In: 120 [P.O.:120] Out: 1 [Urine:1]  General appearance: alert and cooperative Resp: clear to auscultation bilaterally Cardio: regular rate and rhythm GI: soft with focal RUQ pain  Lab Results:  Recent Labs    01/22/20 0300 01/23/20 0256  WBC 10.4 8.0  HGB 10.4* 10.8*  HCT 31.6* 33.2*  PLT 232 232   BMET Recent Labs    01/22/20 0300 01/23/20 0256  NA 141 138  K 3.9 3.8  CL 111 107  CO2 24 24  GLUCOSE 119* 104*  BUN 14 8  CREATININE 0.95 0.75  CALCIUM 8.4* 8.4*   PT/INR No results for input(s): LABPROT, INR in the last 72 hours. ABG No results for input(s): PHART, HCO3 in the last 72 hours.  Invalid input(s): PCO2, PO2  Studies/Results: No results found.  Anti-infectives: Anti-infectives (From admission, onward)   Start     Dose/Rate Route Frequency Ordered Stop   01/20/20 0630  ciprofloxacin (CIPRO) IVPB 400 mg        400 mg 200 mL/hr over 60 Minutes Intravenous On call to O.R. 01/20/20 0625 01/20/20 0745      Assessment/Plan: s/p Procedure(s): ENDOSCOPIC RETROGRADE CHOLANGIOPANCREATOGRAPHY (ERCP) (N/A) PANCREATIC STENT PLACEMENT SPHINCTEROTOMY REMOVAL OF STONES Advance diet  Work on pain control Plan for d/c in am  LOS: 2 days    Autumn Messing III 01/23/2020

## 2020-01-24 ENCOUNTER — Inpatient Hospital Stay (HOSPITAL_COMMUNITY): Payer: Medicare Other

## 2020-01-24 LAB — CYTOLOGY - NON PAP

## 2020-01-24 MED ORDER — ADULT MULTIVITAMIN W/MINERALS CH
1.0000 | ORAL_TABLET | Freq: Every day | ORAL | Status: DC
  Administered 2020-01-25: 1 via ORAL
  Filled 2020-01-24 (×3): qty 1

## 2020-01-24 MED ORDER — IOHEXOL 300 MG/ML  SOLN
100.0000 mL | Freq: Once | INTRAMUSCULAR | Status: AC | PRN
  Administered 2020-01-24: 100 mL via INTRAVENOUS

## 2020-01-24 MED ORDER — SODIUM CHLORIDE 0.9 % IV SOLN: INTRAVENOUS | Status: DC

## 2020-01-24 MED ORDER — TECHNETIUM TC 99M MEBROFENIN IV KIT
5.0000 | PACK | Freq: Once | INTRAVENOUS | Status: AC | PRN
  Administered 2020-01-24: 5 via INTRAVENOUS

## 2020-01-24 MED ORDER — ENSURE ENLIVE PO LIQD: 237.0000 mL | Freq: Two times a day (BID) | ORAL | Status: DC

## 2020-01-24 NOTE — Progress Notes (Signed)
Initial Nutrition Assessment  DOCUMENTATION CODES:   Non-severe (moderate) malnutrition in context of acute illness/injury  INTERVENTION:   Recommend liberalizing diet to regular  Ensure Enlive po BID, each supplement provides 350 kcal and 20 grams of protein  Mighty Shake po BID, each supplement provides 330 kcals and 9 grams of protein  MVI daily   NUTRITION DIAGNOSIS:   Moderate Malnutrition related to acute illness (cholelithiasis) as evidenced by mild muscle depletion, mild fat depletion.    GOAL:   Patient will meet greater than or equal to 90% of their needs    MONITOR:   PO intake, Supplement acceptance, Weight trends, Labs, I & O's  REASON FOR ASSESSMENT:   Malnutrition Screening Tool    ASSESSMENT:   Pt with a history of cholelithiasis and L ovarian cyst presented for laparoscopic cholecystectomy and bilateral salpingo-oophorectomy. Pt then had ERCP on 11/13 with the diagnosis of choledocholithiasis.  Per GI, there is nothing further to do from a GI perspective and diet/post-operative management are to be managed per surgical team. GI signed off.   At baseline, pt has a very balanced and healthy diet and pt is very physically active. Over the last month or so, the pt has been struggling to complete her meals and has not been able to participate in exercise as much. Due to her worsening appetite over the last month, pt reports that she has only been drinking a Premier Protein shake for breakfast. She attempts her usual meals for lunch/dinner (consisting of protein,veggie,starch), but she has not been able to eat it all or eat the same amount that she would at baseline. Pt states that she has been "living on milkshakes." Pt agreeable to use of oral nutrition supplements.   Pt reports unintentional wt loss of ~ 6 lbs. Reviewed wt history; no significant wt changes noted.   PO Intake: 75% x 1 recorded meal  No UOP documented.   Labs: K+ 3.4 (L) Medications  reviewed.    NUTRITION - FOCUSED PHYSICAL EXAM:    Most Recent Value  Orbital Region No depletion  Upper Arm Region Mild depletion  Thoracic and Lumbar Region Mild depletion  Buccal Region Mild depletion  [place] Region Mild depletion  Clavicle Bone Region Mild depletion  Clavicle and Acromion Bone Region Mild depletion  Scapular Bone Region Mild depletion  Dorsal Hand Mild depletion  Patellar Region Mild depletion  Anterior Thigh Region Mild depletion  Posterior Calf Region Mild depletion  Edema (RD Assessment) Mild  Hair Reviewed  Eyes Reviewed  Mouth Reviewed  Skin Reviewed  Nails Reviewed       Diet Order:   Diet Order            Diet Heart Room service appropriate? Yes; Fluid consistency: Thin  Diet effective now                 EDUCATION NEEDS:   No education needs have been identified at this time  Skin:  Skin Assessment: Skin Integrity Issues: Skin Integrity Issues:: Incisions Incisions: abdomen, vagina  Last BM:  11/12  Height:   Ht Readings from Last 1 Encounters:  01/22/20 5\' 10"  (1.778 m)    Weight:   Wt Readings from Last 1 Encounters:  01/22/20 74.8 kg   BMI:  Body mass index is 23.68 kg/m.  Estimated Nutritional Needs:   Kcal:  1900-2100  Protein:  95-105 grams  Fluid:  >/=1.9L/d    Larkin Ina, MS, RD, LDN RD pager number and weekend/on-call  pager number located in [place].

## 2020-01-24 NOTE — Care Management Important Message (Signed)
Important Message  Patient Details  Name: Pamela Whitehead MRN: [id number] Date of Birth: [birth year]   Medicare Important Message Given:  Yes     Kern Gingras 01/24/2020, 2:57 PM

## 2020-01-24 NOTE — Progress Notes (Signed)
Patient c/o severe RUQ stabbing pain 10/10,Dr Byerly on call for surgery notified with new order.

## 2020-01-24 NOTE — Progress Notes (Signed)
3 Days Post-Op   Subjective/Chief Complaint: Still complaining of RUQ pain. No fever. No nausea or vomiting. Lipase normal   Objective: Vital signs in last 24 hours: Temp:  [98.3 F (36.8 C)-99.5 F (37.5 C)] 99.5 F (37.5 C) (11/16 0509) Pulse Rate:  [74-90] 89 (11/16 0509) Resp:  [18-20] 18 (11/16 0509) BP: (135-174)/(72-85) 159/85 (11/16 0509) SpO2:  [95 %-97 %] 95 % (11/16 0509) Last BM Date: 01/20/20  Intake/Output from previous day: 11/15 0701 - 11/16 0700 In: 120 [P.O.:120] Out: 1 [Urine:1] Intake/Output this shift: No intake/output data recorded.  General appearance: alert and cooperative Resp: clear to auscultation bilaterally Cardio: regular rate and rhythm GI: soft, mild to mod RUQ tenderness. incisions look good. good bs  Lab Results:  Recent Labs    01/22/20 0300 01/23/20 0256  WBC 10.4 8.0  HGB 10.4* 10.8*  HCT 31.6* 33.2*  PLT 232 232   BMET Recent Labs    01/23/20 0256 01/23/20 2140  NA 138 136  K 3.8 3.4*  CL 107 103  CO2 24 24  GLUCOSE 104* 111*  BUN 8 9  CREATININE 0.75 0.76  CALCIUM 8.4* 8.5*   PT/INR No results for input(s): LABPROT, INR in the last 72 hours. ABG No results for input(s): PHART, HCO3 in the last 72 hours.  Invalid input(s): PCO2, PO2  Studies/Results: No results found.  Anti-infectives: Anti-infectives (From admission, onward)   Start     Dose/Rate Route Frequency Ordered Stop   01/20/20 0630  ciprofloxacin (CIPRO) IVPB 400 mg        400 mg 200 mL/hr over 60 Minutes Intravenous On call to O.R. 01/20/20 [id number] 01/20/20 0745      Assessment/Plan: s/p Procedure(s): ENDOSCOPIC RETROGRADE CHOLANGIOPANCREATOGRAPHY (ERCP) (N/A) PANCREATIC STENT PLACEMENT SPHINCTEROTOMY REMOVAL OF STONES will get HIDA to rule out bile leak  Lipase normal so unlikely to have post ercp pancreatitis ambulate  LOS: 3 days    Pamela Whitehead 01/24/2020

## 2020-01-25 ENCOUNTER — Inpatient Hospital Stay (HOSPITAL_COMMUNITY): Payer: Medicare Other

## 2020-01-25 DIAGNOSIS — E440 Moderate protein-calorie malnutrition: Secondary | ICD-10-CM | POA: Insufficient documentation

## 2020-01-25 LAB — PROTIME-INR
INR: 1.2 (ref 0.8–1.2)
Prothrombin Time: 15 seconds (ref 11.4–15.2)

## 2020-01-25 MED ORDER — FENTANYL CITRATE (PF) 100 MCG/2ML IJ SOLN
INTRAMUSCULAR | Status: AC | PRN
Start: 2020-01-25 — End: 2020-01-25
  Administered 2020-01-25 (×2): 50 ug via INTRAVENOUS

## 2020-01-25 MED ORDER — FENTANYL CITRATE (PF) 100 MCG/2ML IJ SOLN
INTRAMUSCULAR | Status: AC
  Filled 2020-01-25: qty 2

## 2020-01-25 MED ORDER — MIDAZOLAM HCL 2 MG/2ML IJ SOLN
INTRAMUSCULAR | Status: AC
  Filled 2020-01-25: qty 2

## 2020-01-25 MED ORDER — DIPHENHYDRAMINE HCL 25 MG PO CAPS: 25.0000 mg | ORAL_CAPSULE | Freq: Four times a day (QID) | ORAL | Status: DC | PRN

## 2020-01-25 MED ORDER — LIDOCAINE HCL 1 % IJ SOLN
INTRAMUSCULAR | Status: AC
  Filled 2020-01-25: qty 20

## 2020-01-25 MED ORDER — MIDAZOLAM HCL 2 MG/2ML IJ SOLN
INTRAMUSCULAR | Status: AC | PRN
  Administered 2020-01-25 (×2): 1 mg via INTRAVENOUS

## 2020-01-25 MED ORDER — SODIUM CHLORIDE 0.9% FLUSH
5.0000 mL | Freq: Three times a day (TID) | INTRAVENOUS | Status: DC
  Administered 2020-01-25 – 2020-01-26 (×2): 5 mL

## 2020-01-25 MED ORDER — FENTANYL CITRATE (PF) 100 MCG/2ML IJ SOLN
50.0000 ug | INTRAMUSCULAR | Status: DC | PRN
  Administered 2020-01-26 (×3): 50 ug via INTRAVENOUS
  Filled 2020-01-25 (×3): qty 2

## 2020-01-25 NOTE — Consult Note (Signed)
Chief Complaint: Patient was seen in consultation today for biloma aspiration with drain placement  Referring Physician(s): Dr. Marlou Starks  Supervising Physician: Arne Cleveland  Patient Status: [facility] - In-pt  History of Present Illness: Pamela Whitehead is a 69 y.o. female with a medical history significant for hypertension, H.pylori infection and endometriosis (hysterectomy). She presented to the [place] ED 09/18/19 for abdominal pain, abnormal liver tests and jaundice. CT imaging that day showed extra and intrahepatic biliary duct dilatation with a gallstone and an incidental finding of a 5.5 cm mildly complicated septated left ovarian cyst. Outpatient MRCP showed innumerable small gallstones and the patient presented to [facility] 01/20/20 for a cholecystectomy and bilateral salpingo-oopherectomy. Intraoperative cholangiogram showed a filling defect and she underwent an ERCP 01/21/20 with stone removal and pancreatic stent placement. In the post-op period the patient continued to have RUQ pain and additional imaging was obtained.   NM Hepato Biliary Leak 01/24/20: IMPRESSION: 1. There is no free extravasation of the radiopharmaceutical typical of a bile leak. However, there is a medium size focus of radiotracer accumulation within the expected location of the gallbladder fossa. Primary differential considerations include: Contained leak with small biloma, radiotracer accumulation within cystic duct remnant, or reflux of the radiopharmaceutical into the more proximal portions of the duodenum. Anatomic localization with contrast enhanced CT of the abdomen is recommended.  CT abdomen/pelvis w/contrast 01/24/20: IMPRESSION: 1. Several thin hypoattenuating fluid collections extend across the right lobe liver with indentation of the parenchymal surface suggesting a subcapsular loculated collections. Additionally, more focal collection anteriorly, adjacent the  gallbladder fossa and truncated cystic duct measuring approximately 4.4 x 1.9 x 3 cm in size likely corresponds to the area of radiotracer accumulation on the nuclear medicine examination suspicious for a small contained biloma possibly arising from an accessory duct of Luschka or leak along the truncated cystic duct. Superinfection is not excluded. 2. Mild thickening at the hepatic flexure. Favored to be reactive thickening from the adjacent inflammatory and postsurgical changes in the gallbladder fossa though could correlate for features of a colitis. 3. Short biliary stent remains within the proximal pancreatic duct traversing the common bile duct and terminating in the duodenum. 4. Small to moderate bilateral effusions with adjacent areas of passive atelectasis. 5. Postsurgical changes from recent bilateral salpingo oophorectomy and excision of a previously seen cystic lesion in the left adnexa. 6. Mild symmetric bilateral perinephric stranding, a nonspecific finding which may correlate with advanced age or decreased renal function though should assess for urinary symptoms. 7. Small volume of non organized fluid layering in the pericolic gutters and deep pelvis as well as edematous changes and soft tissue gas along the anterior abdominal wall lateral flanks are nonspecific findings in the setting of a recent operative procedure. 8. Aortic Atherosclerosis (ICD10-I70.0).  Interventional Radiology has been asked to evaluate this patient for a biloma aspiration with drain placement. This case has been reviewed and procedure approved by Dr. Vernard Gambles.    Past Medical History:  Diagnosis Date  . Anaphylactic reaction    Unknown cause  . 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 Atypical squamous cell changes of undetermined significance (ASCUS) on vaginal cytology 12/2015, 05/2017   Negative  high-risk HPV screen  . DES exposure in utero   . Endometriosis   . Hypertension   . Kidney infection    June 2014  .  Peptic ulcer    currently Oct 2015  . PONV (postoperative nausea and vomiting)   . Spondylisthesis   . Ulcer   . 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 BREAST EXCISIONAL BIOPSY Left 2011   Benign  . BREAST SURGERY  02/25/2010   left breast biopsy ..for left breast mass. . . for [place]  . CHOLECYSTECTOMY N/A 01/20/2020   Procedure: LAPAROSCOPIC CHOLECYSTECTOMY WITH INTRAOPERATIVE CHOLANGIOGRAM;  Surgeon: Jovita Kussmaul, MD;  Location: [place];  Service: General;  Laterality: N/A;  . ECTOPIC PREGNANCY SURGERY    . ERCP N/A 01/21/2020   Procedure: ENDOSCOPIC RETROGRADE CHOLANGIOPANCREATOGRAPHY (ERCP);  Surgeon: Ronnette Juniper, MD;  Location: [place];  Service: Gastroenterology;  Laterality: N/A;  . KNEE SURGERY  2003  . LAPAROSCOPIC BILATERAL SALPINGO OOPHERECTOMY Bilateral 01/20/2020   Procedure: LAPAROSCOPIC BILATERAL SALPINGO OOPHORECTOMY;  Surgeon: Joseph Pierini, MD;  Location: [place];  Service: Gynecology;  Laterality: Bilateral;  Dr. Delilah Shan with be working in Dr. Ethlyn Gallery block time. To start at 7:30am requests one hour  . PANCREATIC STENT PLACEMENT  01/21/2020   Procedure: PANCREATIC STENT PLACEMENT;  Surgeon: Ronnette Juniper, MD;  Location: [place];  Service: Gastroenterology;;  . RECTAL SURGERY    . REMOVAL OF STONES  01/21/2020   Procedure: REMOVAL OF STONES;  Surgeon: Ronnette Juniper, MD;  Location: [facility] ENDOSCOPY;  Service: Gastroenterology;;  . Joan Mayans  01/21/2020   Procedure: Joan Mayans;  Surgeon: Ronnette Juniper, MD;  Location: [place];  Service: Gastroenterology;;  . TONSILLECTOMY    . VAGINAL HYSTERECTOMY  1993   vaginal hysterectomy    Allergies: Celebrex [celecoxib] and Penicillins  Medications: Prior to Admission medications   Medication Sig Start Date End  Date Taking? Authorizing Provider  acetaminophen (TYLENOL) 500 MG tablet Take 500 mg by mouth 2 (two) times daily as needed for headache.   Yes [provider]  ALPRAZolam Duanne Moron) 1 MG tablet Take 0.25-0.5 mg by mouth daily as needed for anxiety.    Yes [provider]  Ascorbic Acid (VITAMIN C PO) Take 1 capsule by mouth daily.   Yes [provider]  Cholecalciferol (VITAMIN D3 GUMMIES PO) Take 2 tablets by mouth daily.   Yes [provider]  ELDERBERRY PO Take 1 tablet by mouth daily. With zinc   Yes [provider]  EPINEPHrine 0.3 mg/0.3 mL IJ SOAJ injection Inject 0.3 mLs (0.3 mg total) into the muscle as needed for anaphylaxis. 06/14/18  Yes Fontaine, Belinda Block, MD  estradiol (ESTRACE) 1 MG tablet TAKE 1 TABLET(1 MG) BY MOUTH DAILY Patient taking differently: Take 1 mg by mouth daily.  09/07/19  Yes Joseph Pierini, MD  hydrochlorothiazide (HYDRODIURIL) 25 MG tablet Take 25 mg by mouth daily.   Yes [provider]  loratadine (CLARITIN) 10 MG tablet Take 10 mg by mouth daily.   Yes [provider]  metoprolol succinate (TOPROL-XL) 50 MG 24 hr tablet Take 100 mg by mouth at bedtime. Take with or immediately following a meal.   Yes [provider]  mometasone (NASONEX) 50 MCG/ACT nasal spray Place 1 spray into the nose daily.    Yes [provider]  Multiple Vitamins-Minerals (MULTIVITAMIN GUMMIES ADULT PO) Take 1 capsule by mouth daily.   Yes [provider]  omeprazole (PRILOSEC) 40 MG capsule Take 80 mg by mouth daily.    Yes [provider]  Probiotic Product (CULTURELLE PROBIOTICS PO) Take 1 capsule by mouth daily.  Yes [provider]  traMADol (ULTRAM) 50 MG tablet Take 25 mg by mouth daily as needed for pain. 12/06/19  Yes [provider]  triamcinolone cream (KENALOG) 0.5 % Apply 1 application topically daily as needed for rash. 07/26/19  Yes [provider]    valsartan (DIOVAN) 80 MG tablet Take 80 mg by mouth daily.   Yes [provider]  zolpidem (AMBIEN) 5 MG tablet TAKE 1 TABLET(5 MG) BY MOUTH AT BEDTIME AS NEEDED FOR SLEEP Patient taking differently: Take 2.5 mg by mouth at bedtime as needed for sleep.  09/07/19  Yes Joseph Pierini, MD  estradiol (ESTRACE VAGINAL) 0.1 MG/GM vaginal cream Place 1 Applicatorful vaginally at bedtime. Nightly for 1 month then twice weekly after that. Patient taking differently: Place 1 Applicatorful vaginally at bedtime as needed (dryness).  09/07/19   Joseph Pierini, MD  oxyCODONE (OXY IR/ROXICODONE) 5 MG immediate release tablet Take 1-2 tablets (5-10 mg total) by mouth every 6 (six) hours as needed for moderate pain, severe pain or breakthrough pain. 01/20/20   Jovita Kussmaul, MD     Family History  Problem Relation Age of Onset  . Hypertension Mother   . Dementia Mother   . Stroke Mother   . Heart disease Father   . Breast cancer Maternal Aunt        Age 70's  . Heart disease Brother   . Melanoma Brother   . Stroke Brother     Social History   Socioeconomic History  . Marital status: Divorced    Spouse name: Not on file  . Number of children: Not on file  . Years of education: Not on file  . Highest education level: Not on file  Occupational History  . Not on file  Tobacco Use  . Smoking status: Never Smoker  . Smokeless tobacco: Never Used  Vaping Use  . Vaping Use: Never used  Substance and Sexual Activity  . Alcohol use: Yes    Alcohol/week: 0.0 standard drinks    Comment: rare  . Drug use: No  . Sexual activity: Yes    Birth control/protection: Surgical    Comment: HYST-1st intercourse 89 yo--5 partners  Other Topics Concern  . Not on file  Social History Narrative  . Not on file   Social Determinants of Health   Financial Resource Strain:   . Difficulty of Paying Living Expenses: Not on file  Food Insecurity:   . Worried About Charity fundraiser in the Last  Year: Not on file  . Ran Out of Food in the Last Year: Not on file  Transportation Needs:   . Lack of Transportation (Medical): Not on file  . Lack of Transportation (Non-Medical): Not on file  Physical Activity:   . Days of Exercise per Week: Not on file  . Minutes of Exercise per Session: Not on file  Stress:   . Feeling of Stress : Not on file  Social Connections:   . Frequency of Communication with Friends and Family: Not on file  . Frequency of Social Gatherings with Friends and Family: Not on file  . Attends Religious Services: Not on file  . Active Member of Clubs or Organizations: Not on file  . Attends Archivist Meetings: Not on file  . Marital Status: Not on file    Review of Systems: A 12 point ROS discussed and pertinent positives are indicated in the HPI above.  All other systems are negative.  Review of  Systems  Constitutional: Positive for appetite change and fatigue.  Respiratory: Positive for shortness of breath. Negative for cough.   Cardiovascular: Negative for chest pain and leg swelling.  Gastrointestinal: Positive for abdominal pain and nausea.  Musculoskeletal: Negative for back pain.  Neurological: Positive for light-headedness and headaches.    Vital Signs: BP (!) 158/78 (BP Location: Left Arm)   Pulse 96   Temp 99.5 F (37.5 C) (Oral)   Resp 20   Ht 5\' 10"  (1.778 m)   Wt 165 lb (74.8 kg)   SpO2 93%   BMI 23.68 kg/m   Physical Exam Constitutional:      General: She is not in acute distress.    Appearance: She is not ill-appearing.  HENT:     Mouth/Throat:     Mouth: Mucous membranes are moist.     Pharynx: Oropharynx is clear.  Cardiovascular:     Rate and Rhythm: Normal rate and regular rhythm.     Pulses: Normal pulses.     Heart sounds: Normal heart sounds.  Pulmonary:     Effort: Pulmonary effort is normal.     Breath sounds: Normal breath sounds.  Abdominal:     General: Bowel sounds are normal.     Tenderness: There  is abdominal tenderness.     Comments: 5 laparoscopic incisions on the abdomen - all are well-approximated and without erythema or drainage. Generalized pain/tenderness with significant RUQ pain/tenderness.   Skin:    General: Skin is warm and dry.  Neurological:     Mental Status: She is alert and oriented to person, place, and time.     Imaging: DG Cholangiogram Operative  Result Date: 01/20/2020 CLINICAL DATA:  69 year old female with a history cholelithiasis EXAM: INTRAOPERATIVE CHOLANGIOGRAM TECHNIQUE: Cholangiographic images from the C-arm fluoroscopic device were submitted for interpretation post-operatively. Please see the procedural report for the amount of contrast and the fluoroscopy time utilized. COMPARISON:  None. FINDINGS: Surgical instruments project over the upper abdomen. There is cannulation of the cystic duct/gallbladder neck, with antegrade infusion of contrast. Caliber of the extrahepatic ductal system within normal limits. Geometric filling defects within the distal common bile duct. Free flow of contrast across the ampulla. IMPRESSION: Intraoperative cholangiogram demonstrates extrahepatic biliary ducts of unremarkable caliber, with choledocholithiasis. Correlation with ERCP may be indicated. Please refer to the dictated operative report for full details of intraoperative findings and procedure Electronically Signed   By: Corrie Mckusick D.O.   On: 01/20/2020 10:01   US PELVIS TRANSVAGINAL NON-OB (TV ONLY)  Result Date: 12/27/2019 Surgically absent uterus Normal vaginal cuff Right ovary normal size Left ovarian cyst 6.3 x 5.3 cm with a prominent septation along with multiple echogenic excrescences, largest measuring 8 x 5 mm.  All avascular in appearance. No pelvic free fluid.  Bilateral adnexa without any other masses.  CT ABDOMEN PELVIS W CONTRAST  Result Date: 01/24/2020 CLINICAL DATA:  Abdominal pain, fever, postoperative EXAM: CT ABDOMEN AND PELVIS WITH CONTRAST  TECHNIQUE: Multidetector CT imaging of the abdomen and pelvis was performed using the standard protocol following bolus administration of intravenous contrast. CONTRAST:  166mL OMNIPAQUE IOHEXOL 300 MG/ML  SOLN COMPARISON:  ERCP 01/21/2020, MR abdomen 10/22/2019, nuclear medicine biliary scan 01/24/2020 FINDINGS: Lower chest: Small to moderate bilateral effusions with adjacent areas of passive atelectasis. Normal heart size. No pericardial effusion. Hepatobiliary: Normal hepatic attenuation. Hypoattenuating fluid collections are seen extending across the right lobe liver and within the gallbladder fossa which may reflect sub capsular loculated collections of  fluid. Additionally, a small collection more anteriorly adjacent the gallbladder fossa measuring approximately 4.4 x 1.9 x 3 cm in size (3/29, 6/57) likely corresponds to the area of radiotracer accumulation on the nuclear medicine examination suspicious for a small contained biloma possibly arising from an accessory duct of Luschka or leak along the truncated cystic duct. Pancreas: A small biliary stent is again seen partially within the proximal pancreatic duct traversing a short segment of distal common bile duct and coiling within the duodenum. No pancreatic ductal dilatation or surrounding inflammatory changes. Spleen: Normal in size. No concerning splenic lesions. Adrenals/Urinary Tract: Normal adrenal glands. No concerning adrenal masses. Mild symmetric bilateral perinephric stranding, a nonspecific finding which may correlate with advanced age or decreased renal function. Kidneys are normally located with symmetric enhancement and excretion. No suspicious renal lesion, urolithiasis or hydronephrosis. Urinary bladder is unremarkable for degree of distension. Stomach/Bowel: Distal esophagus, stomach and duodenum are free of acute abnormality. Terminus of a biliary stent within the sweep of the duodenum, as above. High attenuation enteric contrast media  traverses the small bowel to the level of the ascending colon/hepatic flexure. Mild thickening at the hepatic flexure is nonspecific in a reactive to adjacent inflammatory changes in the adjacent gallbladder fossa versus a focal colitis, less favored. No other significant colonic thickening or dilatation. Normal appendix in the right lower quadrant opacified by contrast material as it traverses along the right pelvic sidewall. Vascular/Lymphatic: Atherosclerotic calcifications within the abdominal aorta and branch vessels. No aneurysm or ectasia. No pathologically enlarged abdominopelvic lymph nodes. Reactive adenopathy is however present in the upper abdomen. Reproductive: Postsurgical changes are also present in the deep pelvis from recent bilateral salpingo oophorectomy and excision of a previously seen cystic lesion in the left adnexa. Hysterectomy changes are more remote. Small volume of non organized free fluid is seen in the pelvis which is nonspecific. Other: There is diffuse edematous changes of the body wall as well as scattered foci soft tissue gas along the anterior body wall and lateral flanks, nonspecific given recent operative procedures. Small volume of free intraperitoneal free fluid is noted measuring a relatively simple attenuation (6 HU) more complex seemingly loculated collections are seen along the right lobe liver hepatic capsule and associated with the operative site in the gallbladder fossa, as above. Musculoskeletal: Multilevel degenerative changes are present in the imaged portions of the spine. Levocurvature of the lumbar spine, apex L4 with compensatory thoracolumbar dextrocurvature. Unchanged retrolisthesis L2 on L3 and grade 1 anterolisthesis L4 on L5. Some associated facet arthropathy as well as Modic type endplate changes are present. No associated spondylolysis is evident. No acute or worrisome osseous lesion. IMPRESSION: 1. Several thin hypoattenuating fluid collections extend  across the right lobe liver with indentation of the parenchymal surface suggesting a subcapsular loculated collections. Additionally, more focal collection anteriorly, adjacent the gallbladder fossa and truncated cystic duct measuring approximately 4.4 x 1.9 x 3 cm in size likely corresponds to the area of radiotracer accumulation on the nuclear medicine examination suspicious for a small contained biloma possibly arising from an accessory duct of Luschka or leak along the truncated cystic duct. Superinfection is not excluded. 2. Mild thickening at the hepatic flexure. Favored to be reactive thickening from the adjacent inflammatory and postsurgical changes in the gallbladder fossa though could correlate for features of a colitis. 3. Short biliary stent remains within the proximal pancreatic duct traversing the common bile duct and terminating in the duodenum. 4. Small to moderate bilateral effusions with adjacent  areas of passive atelectasis. 5. Postsurgical changes from recent bilateral salpingo oophorectomy and excision of a previously seen cystic lesion in the left adnexa. 6. Mild symmetric bilateral perinephric stranding, a nonspecific finding which may correlate with advanced age or decreased renal function though should assess for urinary symptoms. 7. Small volume of non organized fluid layering in the pericolic gutters and deep pelvis as well as edematous changes and soft tissue gas along the anterior abdominal wall lateral flanks are nonspecific findings in the setting of a recent operative procedure. 8. Aortic Atherosclerosis (ICD10-I70.0). Electronically Signed   By: Lovena Le M.D.   On: 01/24/2020 20:58   DG ERCP BILIARY & PANCREATIC DUCTS  Result Date: 01/21/2020 CLINICAL DATA:  Choledocholithiasis. EXAM: ERCP TECHNIQUE: Multiple spot images obtained with the fluoroscopic device and submitted for interpretation post-procedure. COMPARISON:  Intraoperative cholangiogram on 01/20/2020 FINDINGS:  Imaging obtained with a C-arm during the endoscopic procedure demonstrates cannulation of the pancreatic duct with placement of a pancreatic ductal stent. Subsequent imaging demonstrates injection of contrast material into the common bile duct which demonstrates moderate dilatation and some filling defects. Balloon sweep maneuver was performed to extract calculi. IMPRESSION: Choledocholithiasis with extraction of calculi from the common bile duct. A pancreatic ductal stent was also placed. These images were submitted for radiologic interpretation only. Please see the procedural report for the amount of contrast and the fluoroscopy time utilized. Electronically Signed   By: Aletta Edouard M.D.   On: 01/21/2020 11:20   NM HEPATO BILIARY LEAK  Addendum Date: 01/24/2020   ADDENDUM REPORT: 01/24/2020 14:31 ADDENDUM: These results were called by telephone at the time of interpretation on 01/24/2020 at 2:31 pm to provider Autumn Messing III , who verbally acknowledged these results. Electronically Signed   By: Kerby Moors M.D.   On: 01/24/2020 14:31   Result Date: 01/24/2020 CLINICAL DATA:  Abdominal pain. Status post laparoscopic cholecystectomy. EXAM: NUCLEAR MEDICINE HEPATOBILIARY IMAGING TECHNIQUE: Sequential images of the abdomen were obtained out to 60 minutes following intravenous administration of radiopharmaceutical. RADIOPHARMACEUTICALS:  5.1 mCi Tc-47m  Choletec IV COMPARISON:  ERCP from 01/21/2020 and intraoperative cholangiogram from 01/20/2020. CT AP 09/18/2019. FINDINGS: Prompt uptake and biliary excretion of activity by the liver is seen. Biliary activity passes into small bowel, consistent with patent common bile duct. In the expected location of the gallbladder fossa there is a focal area of increased radiotracer accumulation which persists over time. This does not have the typical appearance of radiotracer extravasation associated with bowel leak status post cholecystectomy. IMPRESSION: 1. There  is no free extravasation of the radiopharmaceutical typical of a bile leak. However, there is a medium size focus of radiotracer accumulation within the expected location of the gallbladder fossa. Primary differential considerations include: Contained leak with small biloma, radiotracer accumulation within cystic duct remnant, or reflux of the radiopharmaceutical into the more proximal portions of the duodenum. Anatomic localization with contrast enhanced CT of the abdomen is recommended. Electronically Signed: By: Kerby Moors M.D. On: 01/24/2020 13:36    Labs:  CBC: Recent Labs    01/20/20 1517 01/21/20 0127 01/22/20 0300 01/23/20 0256  WBC 12.5* 10.0 10.4 8.0  HGB 12.6 12.2 10.4* 10.8*  HCT 37.6 36.6 31.6* 33.2*  PLT 298 295 232 232    COAGS: No results for input(s): INR, APTT in the last 8760 hours.  BMP: Recent Labs    09/18/19 1003 01/10/20 1336 01/21/20 0127 01/22/20 0300 01/23/20 0256 01/23/20 2140  NA 137   < > 140  141 138 136  K 3.5   < > 4.2 3.9 3.8 3.4*  CL 98   < > 106 111 107 103  CO2 26   < > 26 24 24 24   GLUCOSE 104*   < > 134* 119* 104* 111*  BUN 22   < > 17 14 8 9   CALCIUM 9.3   < > 8.8* 8.4* 8.4* 8.5*  CREATININE 0.98   < > 1.27* 0.95 0.75 0.76  GFRNONAA 59*   < > 46* >60 >60 >60  GFRAA >60  --   --   --   --   --    < > = values in this interval not displayed.    LIVER FUNCTION TESTS: Recent Labs    01/21/20 0127 01/22/20 0300 01/23/20 0256 01/23/20 2140  BILITOT 0.8 1.0 1.3* 1.4*  AST 59* 47* 34 31  ALT 61* 55* 44 41  ALKPHOS 210* 165* 177* 200*  PROT 5.6* 4.9* 4.9* 5.1*  ALBUMIN 3.1* 2.7* 2.6* 2.7*    TUMOR MARKERS: Recent Labs    12/01/19 8110  CEA 1.3    Assessment and Plan:  Post-cholecystectomy biloma: Melynda Keller, 69 year old female, is tentatively scheduled for a biloma aspiration with drain placement 01/25/20 at the [place] [facility] department.   Risks and benefits discussed with the patient  including bleeding, infection, damage to adjacent structures and sepsis.  All of the patient's questions were answered, patient is agreeable to proceed.  She has been NPO. Labs and vitals have been reviewed. She has not been on any blood thinning medications.    Consent signed and in chart.  Thank you for this interesting consult.  I greatly enjoyed meeting [employer] and look forward to participating in their care.  A copy of this report was sent to the requesting provider on this date.  Electronically Signed: Soyla Dryer, AGACNP-BC [phone] 01/25/2020, 10:35 AM   I spent a total of 20 Minutes    in face to face in clinical consultation, greater than 50% of which was counseling/coordinating care for biloma aspiration with drain placement

## 2020-01-25 NOTE — Procedures (Signed)
  Procedure: CT guided RUQ drain placement 69f EBL:   minimal Complications:  none immediate  See full dictation in [employer].  Dillard Cannon MD Main # [phone] Pager  [phone] Mobile [phone]

## 2020-01-25 NOTE — Progress Notes (Signed)
4 Days Post-Op   Subjective/Chief Complaint: Still having RUQ pain. CT shows possible biloma RUQ   Objective: Vital signs in last 24 hours: Temp:  [99.5 F (37.5 C)] 99.5 F (37.5 C) (11/16 2022) Pulse Rate:  [95-96] 96 (11/17 0544) Resp:  [20] 20 (11/17 0544) BP: (158-159)/(78-87) 158/78 (11/17 0544) SpO2:  [93 %-94 %] 93 % (11/17 0544) Last BM Date: 01/23/20  Intake/Output from previous day: 11/16 0701 - 11/17 0700 In: 322.5 [P.O.:120; I.V.:202.5] Out: 1 [Urine:1] Intake/Output this shift: No intake/output data recorded.  General appearance: alert and cooperative Resp: clear to auscultation bilaterally Cardio: regular rate and rhythm GI: soft, moderate RUQ pain  Lab Results:  Recent Labs    01/23/20 0256  WBC 8.0  HGB 10.8*  HCT 33.2*  PLT 232   BMET Recent Labs    01/23/20 0256 01/23/20 2140  NA 138 136  K 3.8 3.4*  CL 107 103  CO2 24 24  GLUCOSE 104* 111*  BUN 8 9  CREATININE 0.75 0.76  CALCIUM 8.4* 8.5*   PT/INR No results for input(s): LABPROT, INR in the last 72 hours. ABG No results for input(s): PHART, HCO3 in the last 72 hours.  Invalid input(s): PCO2, PO2  Studies/Results: CT ABDOMEN PELVIS W CONTRAST  Result Date: 01/24/2020 CLINICAL DATA:  Abdominal pain, fever, postoperative EXAM: CT ABDOMEN AND PELVIS WITH CONTRAST TECHNIQUE: Multidetector CT imaging of the abdomen and pelvis was performed using the standard protocol following bolus administration of intravenous contrast. CONTRAST:  139mL OMNIPAQUE IOHEXOL 300 MG/ML  SOLN COMPARISON:  ERCP 01/21/2020, MR abdomen 10/22/2019, nuclear medicine biliary scan 01/24/2020 FINDINGS: Lower chest: Small to moderate bilateral effusions with adjacent areas of passive atelectasis. Normal heart size. No pericardial effusion. Hepatobiliary: Normal hepatic attenuation. Hypoattenuating fluid collections are seen extending across the right lobe liver and within the gallbladder fossa which may reflect sub  capsular loculated collections of fluid. Additionally, a small collection more anteriorly adjacent the gallbladder fossa measuring approximately 4.4 x 1.9 x 3 cm in size (3/29, 6/57) likely corresponds to the area of radiotracer accumulation on the nuclear medicine examination suspicious for a small contained biloma possibly arising from an accessory duct of Luschka or leak along the truncated cystic duct. Pancreas: A small biliary stent is again seen partially within the proximal pancreatic duct traversing a short segment of distal common bile duct and coiling within the duodenum. No pancreatic ductal dilatation or surrounding inflammatory changes. Spleen: Normal in size. No concerning splenic lesions. Adrenals/Urinary Tract: Normal adrenal glands. No concerning adrenal masses. Mild symmetric bilateral perinephric stranding, a nonspecific finding which may correlate with advanced age or decreased renal function. Kidneys are normally located with symmetric enhancement and excretion. No suspicious renal lesion, urolithiasis or hydronephrosis. Urinary bladder is unremarkable for degree of distension. Stomach/Bowel: Distal esophagus, stomach and duodenum are free of acute abnormality. Terminus of a biliary stent within the sweep of the duodenum, as above. High attenuation enteric contrast media traverses the small bowel to the level of the ascending colon/hepatic flexure. Mild thickening at the hepatic flexure is nonspecific in a reactive to adjacent inflammatory changes in the adjacent gallbladder fossa versus a focal colitis, less favored. No other significant colonic thickening or dilatation. Normal appendix in the right lower quadrant opacified by contrast material as it traverses along the right pelvic sidewall. Vascular/Lymphatic: Atherosclerotic calcifications within the abdominal aorta and branch vessels. No aneurysm or ectasia. No pathologically enlarged abdominopelvic lymph nodes. Reactive adenopathy is  however present in the  upper abdomen. Reproductive: Postsurgical changes are also present in the deep pelvis from recent bilateral salpingo oophorectomy and excision of a previously seen cystic lesion in the left adnexa. Hysterectomy changes are more remote. Small volume of non organized free fluid is seen in the pelvis which is nonspecific. Other: There is diffuse edematous changes of the body wall as well as scattered foci soft tissue gas along the anterior body wall and lateral flanks, nonspecific given recent operative procedures. Small volume of free intraperitoneal free fluid is noted measuring a relatively simple attenuation (6 HU) more complex seemingly loculated collections are seen along the right lobe liver hepatic capsule and associated with the operative site in the gallbladder fossa, as above. Musculoskeletal: Multilevel degenerative changes are present in the imaged portions of the spine. Levocurvature of the lumbar spine, apex L4 with compensatory thoracolumbar dextrocurvature. Unchanged retrolisthesis L2 on L3 and grade 1 anterolisthesis L4 on L5. Some associated facet arthropathy as well as Modic type endplate changes are present. No associated spondylolysis is evident. No acute or worrisome osseous lesion. IMPRESSION: 1. Several thin hypoattenuating fluid collections extend across the right lobe liver with indentation of the parenchymal surface suggesting a subcapsular loculated collections. Additionally, more focal collection anteriorly, adjacent the gallbladder fossa and truncated cystic duct measuring approximately 4.4 x 1.9 x 3 cm in size likely corresponds to the area of radiotracer accumulation on the nuclear medicine examination suspicious for a small contained biloma possibly arising from an accessory duct of Luschka or leak along the truncated cystic duct. Superinfection is not excluded. 2. Mild thickening at the hepatic flexure. Favored to be reactive thickening from the adjacent  inflammatory and postsurgical changes in the gallbladder fossa though could correlate for features of a colitis. 3. Short biliary stent remains within the proximal pancreatic duct traversing the common bile duct and terminating in the duodenum. 4. Small to moderate bilateral effusions with adjacent areas of passive atelectasis. 5. Postsurgical changes from recent bilateral salpingo oophorectomy and excision of a previously seen cystic lesion in the left adnexa. 6. Mild symmetric bilateral perinephric stranding, a nonspecific finding which may correlate with advanced age or decreased renal function though should assess for urinary symptoms. 7. Small volume of non organized fluid layering in the pericolic gutters and deep pelvis as well as edematous changes and soft tissue gas along the anterior abdominal wall lateral flanks are nonspecific findings in the setting of a recent operative procedure. 8. Aortic Atherosclerosis (ICD10-I70.0). Electronically Signed   By: Lovena Le M.D.   On: 01/24/2020 20:58   NM HEPATO BILIARY LEAK  Addendum Date: 01/24/2020   ADDENDUM REPORT: 01/24/2020 14:31 ADDENDUM: These results were called by telephone at the time of interpretation on 01/24/2020 at 2:31 pm to provider Autumn Messing III , who verbally acknowledged these results. Electronically Signed   By: Kerby Moors M.D.   On: 01/24/2020 14:31   Result Date: 01/24/2020 CLINICAL DATA:  Abdominal pain. Status post laparoscopic cholecystectomy. EXAM: NUCLEAR MEDICINE HEPATOBILIARY IMAGING TECHNIQUE: Sequential images of the abdomen were obtained out to 60 minutes following intravenous administration of radiopharmaceutical. RADIOPHARMACEUTICALS:  5.1 mCi Tc-43m  Choletec IV COMPARISON:  ERCP from 01/21/2020 and intraoperative cholangiogram from 01/20/2020. CT AP 09/18/2019. FINDINGS: Prompt uptake and biliary excretion of activity by the liver is seen. Biliary activity passes into small bowel, consistent with patent common bile  duct. In the expected location of the gallbladder fossa there is a focal area of increased radiotracer accumulation which persists over time. This does  not have the typical appearance of radiotracer extravasation associated with bowel leak status post cholecystectomy. IMPRESSION: 1. There is no free extravasation of the radiopharmaceutical typical of a bile leak. However, there is a medium size focus of radiotracer accumulation within the expected location of the gallbladder fossa. Primary differential considerations include: Contained leak with small biloma, radiotracer accumulation within cystic duct remnant, or reflux of the radiopharmaceutical into the more proximal portions of the duodenum. Anatomic localization with contrast enhanced CT of the abdomen is recommended. Electronically Signed: By: Kerby Moors M.D. On: 01/24/2020 13:36    Anti-infectives: Anti-infectives (From admission, onward)   Start     Dose/Rate Route Frequency Ordered Stop   01/20/20 0630  ciprofloxacin (CIPRO) IVPB 400 mg        400 mg 200 mL/hr over 60 Minutes Intravenous On call to O.R. 01/20/20 1007 01/20/20 0745      Assessment/Plan: s/p Procedure(s): ENDOSCOPIC RETROGRADE CHOLANGIOPANCREATOGRAPHY (ERCP) (N/A) PANCREATIC STENT PLACEMENT SPHINCTEROTOMY REMOVAL OF STONES will ask IR to drain fluid today  Advance diet afterward Hopefully home soon  LOS: 4 days    Autumn Messing III 01/25/2020

## 2020-01-26 NOTE — Progress Notes (Signed)
Discharge home. Home discharge instruction given to patient and partner.

## 2020-01-26 NOTE — Progress Notes (Signed)
After discharging patient, patient called me and said that someone from IR told her that she needs to stay one or two more days after drain placement and that I should communicate that to Dr. Marlou Starks. . Dr. Marlou Starks made aware and stated that IR should communicate with him with the plan, IR made aware and called me that they will check on that but this writer received a call from PA saying that patient can go and that he just educated patient about the drain, PA talked with the patient partner and explained to  miscommunication.

## 2020-01-26 NOTE — Progress Notes (Signed)
5 Days Post-Op   Subjective/Chief Complaint: Feels better today. Drain output bile tinged but watery   Objective: Vital signs in last 24 hours: Temp:  [98.5 F (36.9 C)-99 F (37.2 C)] 99 F (37.2 C) (11/18 0500) Pulse Rate:  [87-102] 89 (11/18 0500) Resp:  [20-22] 20 (11/18 0500) BP: (133-157)/(65-86) 147/80 (11/18 0500) SpO2:  [91 %-100 %] 93 % (11/18 0500) Last BM Date: 01/25/20  Intake/Output from previous day: 11/17 0701 - 11/18 0700 In: 1951 [P.O.:340; I.V.:1601] Out: 36 [Drains:35; Stool:1] Intake/Output this shift: No intake/output data recorded.  General appearance: alert and cooperative Resp: clear to auscultation bilaterally Cardio: regular rate and rhythm GI: soft, mild RUQ tenderness. drain in place  Lab Results:  No results for input(s): WBC, HGB, HCT, PLT in the last 72 hours. BMET Recent Labs    01/23/20 2140  NA 136  K 3.4*  CL 103  CO2 24  GLUCOSE 111*  BUN 9  CREATININE 0.76  CALCIUM 8.5*   PT/INR Recent Labs    01/25/20 1115  LABPROT 15.0  INR 1.2   ABG No results for input(s): PHART, HCO3 in the last 72 hours.  Invalid input(s): PCO2, PO2  Studies/Results: CT ABDOMEN PELVIS W CONTRAST  Result Date: 01/24/2020 CLINICAL DATA:  Abdominal pain, fever, postoperative EXAM: CT ABDOMEN AND PELVIS WITH CONTRAST TECHNIQUE: Multidetector CT imaging of the abdomen and pelvis was performed using the standard protocol following bolus administration of intravenous contrast. CONTRAST:  189mL OMNIPAQUE IOHEXOL 300 MG/ML  SOLN COMPARISON:  ERCP 01/21/2020, MR abdomen 10/22/2019, nuclear medicine biliary scan 01/24/2020 FINDINGS: Lower chest: Small to moderate bilateral effusions with adjacent areas of passive atelectasis. Normal heart size. No pericardial effusion. Hepatobiliary: Normal hepatic attenuation. Hypoattenuating fluid collections are seen extending across the right lobe liver and within the gallbladder fossa which may reflect sub capsular  loculated collections of fluid. Additionally, a small collection more anteriorly adjacent the gallbladder fossa measuring approximately 4.4 x 1.9 x 3 cm in size (3/29, 6/57) likely corresponds to the area of radiotracer accumulation on the nuclear medicine examination suspicious for a small contained biloma possibly arising from an accessory duct of Luschka or leak along the truncated cystic duct. Pancreas: A small biliary stent is again seen partially within the proximal pancreatic duct traversing a short segment of distal common bile duct and coiling within the duodenum. No pancreatic ductal dilatation or surrounding inflammatory changes. Spleen: Normal in size. No concerning splenic lesions. Adrenals/Urinary Tract: Normal adrenal glands. No concerning adrenal masses. Mild symmetric bilateral perinephric stranding, a nonspecific finding which may correlate with advanced age or decreased renal function. Kidneys are normally located with symmetric enhancement and excretion. No suspicious renal lesion, urolithiasis or hydronephrosis. Urinary bladder is unremarkable for degree of distension. Stomach/Bowel: Distal esophagus, stomach and duodenum are free of acute abnormality. Terminus of a biliary stent within the sweep of the duodenum, as above. High attenuation enteric contrast media traverses the small bowel to the level of the ascending colon/hepatic flexure. Mild thickening at the hepatic flexure is nonspecific in a reactive to adjacent inflammatory changes in the adjacent gallbladder fossa versus a focal colitis, less favored. No other significant colonic thickening or dilatation. Normal appendix in the right lower quadrant opacified by contrast material as it traverses along the right pelvic sidewall. Vascular/Lymphatic: Atherosclerotic calcifications within the abdominal aorta and branch vessels. No aneurysm or ectasia. No pathologically enlarged abdominopelvic lymph nodes. Reactive adenopathy is however  present in the upper abdomen. Reproductive: Postsurgical changes are also  present in the deep pelvis from recent bilateral salpingo oophorectomy and excision of a previously seen cystic lesion in the left adnexa. Hysterectomy changes are more remote. Small volume of non organized free fluid is seen in the pelvis which is nonspecific. Other: There is diffuse edematous changes of the body wall as well as scattered foci soft tissue gas along the anterior body wall and lateral flanks, nonspecific given recent operative procedures. Small volume of free intraperitoneal free fluid is noted measuring a relatively simple attenuation (6 HU) more complex seemingly loculated collections are seen along the right lobe liver hepatic capsule and associated with the operative site in the gallbladder fossa, as above. Musculoskeletal: Multilevel degenerative changes are present in the imaged portions of the spine. Levocurvature of the lumbar spine, apex L4 with compensatory thoracolumbar dextrocurvature. Unchanged retrolisthesis L2 on L3 and grade 1 anterolisthesis L4 on L5. Some associated facet arthropathy as well as Modic type endplate changes are present. No associated spondylolysis is evident. No acute or worrisome osseous lesion. IMPRESSION: 1. Several thin hypoattenuating fluid collections extend across the right lobe liver with indentation of the parenchymal surface suggesting a subcapsular loculated collections. Additionally, more focal collection anteriorly, adjacent the gallbladder fossa and truncated cystic duct measuring approximately 4.4 x 1.9 x 3 cm in size likely corresponds to the area of radiotracer accumulation on the nuclear medicine examination suspicious for a small contained biloma possibly arising from an accessory duct of Luschka or leak along the truncated cystic duct. Superinfection is not excluded. 2. Mild thickening at the hepatic flexure. Favored to be reactive thickening from the adjacent inflammatory  and postsurgical changes in the gallbladder fossa though could correlate for features of a colitis. 3. Short biliary stent remains within the proximal pancreatic duct traversing the common bile duct and terminating in the duodenum. 4. Small to moderate bilateral effusions with adjacent areas of passive atelectasis. 5. Postsurgical changes from recent bilateral salpingo oophorectomy and excision of a previously seen cystic lesion in the left adnexa. 6. Mild symmetric bilateral perinephric stranding, a nonspecific finding which may correlate with advanced age or decreased renal function though should assess for urinary symptoms. 7. Small volume of non organized fluid layering in the pericolic gutters and deep pelvis as well as edematous changes and soft tissue gas along the anterior abdominal wall lateral flanks are nonspecific findings in the setting of a recent operative procedure. 8. Aortic Atherosclerosis (ICD10-I70.0). Electronically Signed   By: Lovena Le M.D.   On: 01/24/2020 20:58   CT IMAGE GUIDED FLUID DRAIN BY CATHETER  Result Date: 01/25/2020 CLINICAL DATA:  Pain post cholecystectomy. CT demonstrates Several thin hypoattenuating fluid collections extend across the right lobe liver with indentation of the parenchymal surface suggesting a subcapsular loculated collections. Additionally, more focal collection anteriorly, adjacent the gallbladder fossa and truncated cystic duct measuring approximately 4.4 x 1.9 x 3 cm in size likely corresponds to  area of radiotracer accumulation on the nuclear medicine examination suspicious for a small contained biloma possibly arising from an accessory duct of Luschka or leak along the truncated cystic duct. Superinfection is not excluded. Drain requested. EXAM: CT GUIDED DRAINAGE OF RIGHT UPPER QUADRANT PERITONEAL ABSCESS ANESTHESIA/SEDATION: Intravenous Fentanyl 24mcg and Versed 1mg  were administered as conscious sedation during continuous monitoring of the  patient's level of consciousness and physiological / cardiorespiratory status by the radiology RN, with a total moderate sedation time of 20 minutes. PROCEDURE: The procedure, risks, benefits, and alternatives were explained to the patient. Questions regarding the  procedure were encouraged and answered. The patient understands and consents to the procedure. Select axial scans through the abdomen were obtained. The loculated right upper quadrant fluid collection was localized and an appropriate skin entry site was determined and marked. The operative field was prepped with chlorhexidinein a sterile fashion, and a sterile drape was applied covering the operative field. A sterile gown and sterile gloves were used for the procedure. Local anesthesia was provided with 1% Lidocaine. Under CT fluoroscopic guidance, 18 gauge trocar needle advanced into the collection. Thin cloudy yellow fluid could be aspirated. Amplatz wire advanced easily to the collection, position confirmed on CT. Tract dilated to facilitate placement of 10 French pigtail drain catheter, formed centrally within the collection. Position confirmed on CT. 10 mL of aspirate sent for Gram stain and culture, and total bilirubin. The catheter was secured externally with 0 Prolene suture and StatLock and placed to gravity drain bag. The patient tolerated the procedure well. COMPLICATIONS: None immediate FINDINGS: Loculated anterior right upper quadrant peritoneal collection just inferior to the liver was localized. 10 French pigtail drain catheter placed as above. A sample of the yellow thin aspirate was sent for Gram stain, culture, and total bilirubin. IMPRESSION: Technically successful CT-guided right upper quadrant drain catheter placement. Electronically Signed   By: Lucrezia Europe M.D.   On: 01/25/2020 15:00   NM HEPATO BILIARY LEAK  Addendum Date: 01/24/2020   ADDENDUM REPORT: 01/24/2020 14:31 ADDENDUM: These results were called by telephone at the time  of interpretation on 01/24/2020 at 2:31 pm to provider Autumn Messing III , who verbally acknowledged these results. Electronically Signed   By: Kerby Moors M.D.   On: 01/24/2020 14:31   Result Date: 01/24/2020 CLINICAL DATA:  Abdominal pain. Status post laparoscopic cholecystectomy. EXAM: NUCLEAR MEDICINE HEPATOBILIARY IMAGING TECHNIQUE: Sequential images of the abdomen were obtained out to 60 minutes following intravenous administration of radiopharmaceutical. RADIOPHARMACEUTICALS:  5.1 mCi Tc-32m  Choletec IV COMPARISON:  ERCP from 01/21/2020 and intraoperative cholangiogram from 01/20/2020. CT AP 09/18/2019. FINDINGS: Prompt uptake and biliary excretion of activity by the liver is seen. Biliary activity passes into small bowel, consistent with patent common bile duct. In the expected location of the gallbladder fossa there is a focal area of increased radiotracer accumulation which persists over time. This does not have the typical appearance of radiotracer extravasation associated with bowel leak status post cholecystectomy. IMPRESSION: 1. There is no free extravasation of the radiopharmaceutical typical of a bile leak. However, there is a medium size focus of radiotracer accumulation within the expected location of the gallbladder fossa. Primary differential considerations include: Contained leak with small biloma, radiotracer accumulation within cystic duct remnant, or reflux of the radiopharmaceutical into the more proximal portions of the duodenum. Anatomic localization with contrast enhanced CT of the abdomen is recommended. Electronically Signed: By: Kerby Moors M.D. On: 01/24/2020 13:36    Anti-infectives: Anti-infectives (From admission, onward)   Start     Dose/Rate Route Frequency Ordered Stop   01/20/20 0630  ciprofloxacin (CIPRO) IVPB 400 mg        400 mg 200 mL/hr over 60 Minutes Intravenous On call to O.R. 01/20/20 4627 01/20/20 0745      Assessment/Plan: s/p  Procedure(s): ENDOSCOPIC RETROGRADE CHOLANGIOPANCREATOGRAPHY (ERCP) (N/A) PANCREATIC STENT PLACEMENT SPHINCTEROTOMY REMOVAL OF STONES Advance diet Discharge  Will need follow up with IR in drain clinic next week  LOS: 5 days    Autumn Messing III 01/26/2020

## 2020-01-26 NOTE — Progress Notes (Signed)
Referring Physician(s): Toth,P  Supervising Physician: Daryll Brod  Patient Status:  [facility] - In-pt  Chief Complaint:  Abdominal pain  Subjective: Pt doing fairly well; abd pain has improved since drain placed; denies N/V   Allergies: Celebrex [celecoxib], Penicillins, and Morphine and related  Medications: Prior to Admission medications   Medication Sig Start Date End Date Taking? Authorizing Provider  acetaminophen (TYLENOL) 500 MG tablet Take 500 mg by mouth 2 (two) times daily as needed for headache.   Yes [provider]  ALPRAZolam Duanne Moron) 1 MG tablet Take 0.25-0.5 mg by mouth daily as needed for anxiety.    Yes [provider]  Ascorbic Acid (VITAMIN C PO) Take 1 capsule by mouth daily.   Yes [provider]  Cholecalciferol (VITAMIN D3 GUMMIES PO) Take 2 tablets by mouth daily.   Yes [provider]  ELDERBERRY PO Take 1 tablet by mouth daily. With zinc   Yes [provider]  EPINEPHrine 0.3 mg/0.3 mL IJ SOAJ injection Inject 0.3 mLs (0.3 mg total) into the muscle as needed for anaphylaxis. 06/14/18  Yes Fontaine, Belinda Block, MD  estradiol (ESTRACE) 1 MG tablet TAKE 1 TABLET(1 MG) BY MOUTH DAILY Patient taking differently: Take 1 mg by mouth daily.  09/07/19  Yes Joseph Pierini, MD  hydrochlorothiazide (HYDRODIURIL) 25 MG tablet Take 25 mg by mouth daily.   Yes [provider]  loratadine (CLARITIN) 10 MG tablet Take 10 mg by mouth daily.   Yes [provider]  metoprolol succinate (TOPROL-XL) 50 MG 24 hr tablet Take 100 mg by mouth at bedtime. Take with or immediately following a meal.   Yes [provider]  mometasone (NASONEX) 50 MCG/ACT nasal spray Place 1 spray into the nose daily.    Yes [provider]  Multiple Vitamins-Minerals (MULTIVITAMIN GUMMIES ADULT PO) Take 1 capsule by mouth daily.   Yes [provider]  omeprazole (PRILOSEC) 40 MG capsule Take 80 mg by mouth daily.     Yes [provider]  Probiotic Product (CULTURELLE PROBIOTICS PO) Take 1 capsule by mouth daily.   Yes [provider]  traMADol (ULTRAM) 50 MG tablet Take 25 mg by mouth daily as needed for pain. 12/06/19  Yes [provider]  triamcinolone cream (KENALOG) 0.5 % Apply 1 application topically daily as needed for rash. 07/26/19  Yes [provider]  valsartan (DIOVAN) 80 MG tablet Take 80 mg by mouth daily.   Yes [provider]  zolpidem (AMBIEN) 5 MG tablet TAKE 1 TABLET(5 MG) BY MOUTH AT BEDTIME AS NEEDED FOR SLEEP Patient taking differently: Take 2.5 mg by mouth at bedtime as needed for sleep.  09/07/19  Yes Joseph Pierini, MD  estradiol (ESTRACE VAGINAL) 0.1 MG/GM vaginal cream Place 1 Applicatorful vaginally at bedtime. Nightly for 1 month then twice weekly after that. Patient taking differently: Place 1 Applicatorful vaginally at bedtime as needed (dryness).  09/07/19   Joseph Pierini, MD  oxyCODONE (OXY IR/ROXICODONE) 5 MG immediate release tablet Take 1-2 tablets (5-10 mg total) by mouth every 6 (six) hours as needed for moderate pain, severe pain or breakthrough pain. 01/20/20   Jovita Kussmaul, MD     Vital Signs: BP (!) 147/80 (BP Location: Left Arm)   Pulse 89   Temp 99 F (37.2 C) (Oral)   Resp 20   Ht 5\' 10"  (1.778 m)   Wt 165 lb (74.8 kg)   SpO2 93%   BMI 23.68 kg/m  Physical Exam awake, alert; abd drain intact, insertion site mildly tender, OP 35 cc somewhat bilious appearing fluid; drain flushed gently without difficulty  Imaging: CT ABDOMEN PELVIS W CONTRAST  Result Date: 01/24/2020 CLINICAL DATA:  Abdominal pain, fever, postoperative EXAM: CT ABDOMEN AND PELVIS WITH CONTRAST TECHNIQUE: Multidetector CT imaging of the abdomen and pelvis was performed using the standard protocol following bolus administration of intravenous contrast. CONTRAST:  184mL OMNIPAQUE IOHEXOL 300 MG/ML  SOLN COMPARISON:  ERCP 01/21/2020, MR  abdomen 10/22/2019, nuclear medicine biliary scan 01/24/2020 FINDINGS: Lower chest: Small to moderate bilateral effusions with adjacent areas of passive atelectasis. Normal heart size. No pericardial effusion. Hepatobiliary: Normal hepatic attenuation. Hypoattenuating fluid collections are seen extending across the right lobe liver and within the gallbladder fossa which may reflect sub capsular loculated collections of fluid. Additionally, a small collection more anteriorly adjacent the gallbladder fossa measuring approximately 4.4 x 1.9 x 3 cm in size (3/29, 6/57) likely corresponds to the area of radiotracer accumulation on the nuclear medicine examination suspicious for a small contained biloma possibly arising from an accessory duct of Luschka or leak along the truncated cystic duct. Pancreas: A small biliary stent is again seen partially within the proximal pancreatic duct traversing a short segment of distal common bile duct and coiling within the duodenum. No pancreatic ductal dilatation or surrounding inflammatory changes. Spleen: Normal in size. No concerning splenic lesions. Adrenals/Urinary Tract: Normal adrenal glands. No concerning adrenal masses. Mild symmetric bilateral perinephric stranding, a nonspecific finding which may correlate with advanced age or decreased renal function. Kidneys are normally located with symmetric enhancement and excretion. No suspicious renal lesion, urolithiasis or hydronephrosis. Urinary bladder is unremarkable for degree of distension. Stomach/Bowel: Distal esophagus, stomach and duodenum are free of acute abnormality. Terminus of a biliary stent within the sweep of the duodenum, as above. High attenuation enteric contrast media traverses the small bowel to the level of the ascending colon/hepatic flexure. Mild thickening at the hepatic flexure is nonspecific in a reactive to adjacent inflammatory changes in the adjacent gallbladder fossa versus a focal colitis, less  favored. No other significant colonic thickening or dilatation. Normal appendix in the right lower quadrant opacified by contrast material as it traverses along the right pelvic sidewall. Vascular/Lymphatic: Atherosclerotic calcifications within the abdominal aorta and branch vessels. No aneurysm or ectasia. No pathologically enlarged abdominopelvic lymph nodes. Reactive adenopathy is however present in the upper abdomen. Reproductive: Postsurgical changes are also present in the deep pelvis from recent bilateral salpingo oophorectomy and excision of a previously seen cystic lesion in the left adnexa. Hysterectomy changes are more remote. Small volume of non organized free fluid is seen in the pelvis which is nonspecific. Other: There is diffuse edematous changes of the body wall as well as scattered foci soft tissue gas along the anterior body wall and lateral flanks, nonspecific given recent operative procedures. Small volume of free intraperitoneal free fluid is noted measuring a relatively simple attenuation (6 HU) more complex seemingly loculated collections are seen along the right lobe liver hepatic capsule and associated with the operative site in the gallbladder fossa, as above. Musculoskeletal: Multilevel degenerative changes are present in the imaged portions of the spine. Levocurvature of the lumbar spine, apex L4 with compensatory thoracolumbar dextrocurvature. Unchanged retrolisthesis L2 on L3 and grade 1 anterolisthesis L4 on L5. Some associated facet arthropathy as well as Modic type endplate changes are present. No associated spondylolysis is evident. No acute or worrisome osseous lesion. IMPRESSION: 1. Several thin hypoattenuating fluid  collections extend across the right lobe liver with indentation of the parenchymal surface suggesting a subcapsular loculated collections. Additionally, more focal collection anteriorly, adjacent the gallbladder fossa and truncated cystic duct measuring  approximately 4.4 x 1.9 x 3 cm in size likely corresponds to the area of radiotracer accumulation on the nuclear medicine examination suspicious for a small contained biloma possibly arising from an accessory duct of Luschka or leak along the truncated cystic duct. Superinfection is not excluded. 2. Mild thickening at the hepatic flexure. Favored to be reactive thickening from the adjacent inflammatory and postsurgical changes in the gallbladder fossa though could correlate for features of a colitis. 3. Short biliary stent remains within the proximal pancreatic duct traversing the common bile duct and terminating in the duodenum. 4. Small to moderate bilateral effusions with adjacent areas of passive atelectasis. 5. Postsurgical changes from recent bilateral salpingo oophorectomy and excision of a previously seen cystic lesion in the left adnexa. 6. Mild symmetric bilateral perinephric stranding, a nonspecific finding which may correlate with advanced age or decreased renal function though should assess for urinary symptoms. 7. Small volume of non organized fluid layering in the pericolic gutters and deep pelvis as well as edematous changes and soft tissue gas along the anterior abdominal wall lateral flanks are nonspecific findings in the setting of a recent operative procedure. 8. Aortic Atherosclerosis (ICD10-I70.0). Electronically Signed   By: Lovena Le M.D.   On: 01/24/2020 20:58   CT IMAGE GUIDED FLUID DRAIN BY CATHETER  Result Date: 01/25/2020 CLINICAL DATA:  Pain post cholecystectomy. CT demonstrates Several thin hypoattenuating fluid collections extend across the right lobe liver with indentation of the parenchymal surface suggesting a subcapsular loculated collections. Additionally, more focal collection anteriorly, adjacent the gallbladder fossa and truncated cystic duct measuring approximately 4.4 x 1.9 x 3 cm in size likely corresponds to  area of radiotracer accumulation on the nuclear medicine  examination suspicious for a small contained biloma possibly arising from an accessory duct of Luschka or leak along the truncated cystic duct. Superinfection is not excluded. Drain requested. EXAM: CT GUIDED DRAINAGE OF RIGHT UPPER QUADRANT PERITONEAL ABSCESS ANESTHESIA/SEDATION: Intravenous Fentanyl 74mcg and Versed 1mg  were administered as conscious sedation during continuous monitoring of the patient's level of consciousness and physiological / cardiorespiratory status by the radiology RN, with a total moderate sedation time of 20 minutes. PROCEDURE: The procedure, risks, benefits, and alternatives were explained to the patient. Questions regarding the procedure were encouraged and answered. The patient understands and consents to the procedure. Select axial scans through the abdomen were obtained. The loculated right upper quadrant fluid collection was localized and an appropriate skin entry site was determined and marked. The operative field was prepped with chlorhexidinein a sterile fashion, and a sterile drape was applied covering the operative field. A sterile gown and sterile gloves were used for the procedure. Local anesthesia was provided with 1% Lidocaine. Under CT fluoroscopic guidance, 18 gauge trocar needle advanced into the collection. Thin cloudy yellow fluid could be aspirated. Amplatz wire advanced easily to the collection, position confirmed on CT. Tract dilated to facilitate placement of 10 French pigtail drain catheter, formed centrally within the collection. Position confirmed on CT. 10 mL of aspirate sent for Gram stain and culture, and total bilirubin. The catheter was secured externally with 0 Prolene suture and StatLock and placed to gravity drain bag. The patient tolerated the procedure well. COMPLICATIONS: None immediate FINDINGS: Loculated anterior right upper quadrant peritoneal collection just inferior to the liver was  localized. 10 French pigtail drain catheter placed as above. A  sample of the yellow thin aspirate was sent for Gram stain, culture, and total bilirubin. IMPRESSION: Technically successful CT-guided right upper quadrant drain catheter placement. Electronically Signed   By: Lucrezia Europe M.D.   On: 01/25/2020 15:00   NM HEPATO BILIARY LEAK  Addendum Date: 01/24/2020   ADDENDUM REPORT: 01/24/2020 14:31 ADDENDUM: These results were called by telephone at the time of interpretation on 01/24/2020 at 2:31 pm to provider Autumn Messing III , who verbally acknowledged these results. Electronically Signed   By: Kerby Moors M.D.   On: 01/24/2020 14:31   Result Date: 01/24/2020 CLINICAL DATA:  Abdominal pain. Status post laparoscopic cholecystectomy. EXAM: NUCLEAR MEDICINE HEPATOBILIARY IMAGING TECHNIQUE: Sequential images of the abdomen were obtained out to 60 minutes following intravenous administration of radiopharmaceutical. RADIOPHARMACEUTICALS:  5.1 mCi Tc-60m  Choletec IV COMPARISON:  ERCP from 01/21/2020 and intraoperative cholangiogram from 01/20/2020. CT AP 09/18/2019. FINDINGS: Prompt uptake and biliary excretion of activity by the liver is seen. Biliary activity passes into small bowel, consistent with patent common bile duct. In the expected location of the gallbladder fossa there is a focal area of increased radiotracer accumulation which persists over time. This does not have the typical appearance of radiotracer extravasation associated with bowel leak status post cholecystectomy. IMPRESSION: 1. There is no free extravasation of the radiopharmaceutical typical of a bile leak. However, there is a medium size focus of radiotracer accumulation within the expected location of the gallbladder fossa. Primary differential considerations include: Contained leak with small biloma, radiotracer accumulation within cystic duct remnant, or reflux of the radiopharmaceutical into the more proximal portions of the duodenum. Anatomic localization with contrast enhanced CT of the abdomen  is recommended. Electronically Signed: By: Kerby Moors M.D. On: 01/24/2020 13:36    Labs:  CBC: Recent Labs    01/20/20 1517 01/21/20 0127 01/22/20 0300 01/23/20 0256  WBC 12.5* 10.0 10.4 8.0  HGB 12.6 12.2 10.4* 10.8*  HCT 37.6 36.6 31.6* 33.2*  PLT 298 295 232 232    COAGS: Recent Labs    01/25/20 1115  INR 1.2    BMP: Recent Labs    09/18/19 1003 01/10/20 1336 01/21/20 0127 01/22/20 0300 01/23/20 0256 01/23/20 2140  NA 137   < > 140 141 138 136  K 3.5   < > 4.2 3.9 3.8 3.4*  CL 98   < > 106 111 107 103  CO2 26   < > 26 24 24 24   GLUCOSE 104*   < > 134* 119* 104* 111*  BUN 22   < > 17 14 8 9   CALCIUM 9.3   < > 8.8* 8.4* 8.4* 8.5*  CREATININE 0.98   < > 1.27* 0.95 0.75 0.76  GFRNONAA 59*   < > 46* >60 >60 >60  GFRAA >60  --   --   --   --   --    < > = values in this interval not displayed.    LIVER FUNCTION TESTS: Recent Labs    01/21/20 0127 01/22/20 0300 01/23/20 0256 01/23/20 2140  BILITOT 0.8 1.0 1.3* 1.4*  AST 59* 47* 34 31  ALT 61* 55* 44 41  ALKPHOS 210* 165* 177* 200*  PROT 5.6* 4.9* 4.9* 5.1*  ALBUMIN 3.1* 2.7* 2.6* 2.7*    Assessment and Plan: Pt with hx cholecystectomy 01/20/20, ERCP with stone retrieval/panc stent placement 01/21/20; s/p drainage of GB fossa fluid collection 11/17; afebrile; temp  99; no new labs; fluid cx/bilirubin level pend; cont with drain irrigation tid as IP, once daily with 5 cc sterile saline as OP; monitor labs; pt will be scheduled for OP IR drain clinic f/u next week  Electronically Signed: D. Rowe Robert, PA-C 01/26/2020, 11:00 AM   I spent a total of 15 minutes at the the patient's bedside AND on the patient's hospital floor or unit, greater than 50% of which was counseling/coordinating care for gallbladder fossa drain    Patient ID: Pamela Whitehead, female   DOB: 1950/04/02, 69 y.o.   MRN: [id number]

## 2020-01-26 NOTE — Plan of Care (Signed)
  Problem: Education: Goal: Knowledge of General Education information will improve Description: Including pain rating scale, medication(s)/side effects and non-pharmacologic comfort measures Outcome: Completed/Met   Problem: Health Behavior/Discharge Planning: Goal: Ability to manage health-related needs will improve Outcome: Completed/Met   Problem: Clinical Measurements: Goal: Ability to maintain clinical measurements within normal limits will improve Outcome: Completed/Met Goal: Will remain free from infection Outcome: Completed/Met Goal: Diagnostic test results will improve Outcome: Completed/Met Goal: Respiratory complications will improve Outcome: Completed/Met Goal: Cardiovascular complication will be avoided Outcome: Completed/Met   Problem: Activity: Goal: Risk for activity intolerance will decrease Outcome: Completed/Met   Problem: Nutrition: Goal: Adequate nutrition will be maintained Outcome: Completed/Met   Problem: Coping: Goal: Level of anxiety will decrease Outcome: Completed/Met   Problem: Elimination: Goal: Will not experience complications related to bowel motility Outcome: Completed/Met Goal: Will not experience complications related to urinary retention Outcome: Completed/Met   Problem: Pain Managment: Goal: General experience of comfort will improve Outcome: Completed/Met   Problem: Safety: Goal: Ability to remain free from injury will improve Outcome: Completed/Met   Problem: Skin Integrity: Goal: Risk for impaired skin integrity will decrease Outcome: Completed/Met   Problem: Education: Goal: Required Educational Video(s) Outcome: Completed/Met   Problem: Clinical Measurements: Goal: Postoperative complications will be avoided or minimized Outcome: Completed/Met   Problem: Skin Integrity: Goal: Demonstration of wound healing without infection will improve Outcome: Completed/Met

## 2020-01-26 NOTE — Plan of Care (Signed)
  Problem: Activity: Goal: Risk for activity intolerance will decrease Outcome: Progressing   Problem: Elimination: Goal: Will not experience complications related to bowel motility Outcome: Progressing   Problem: Nutrition: Goal: Adequate nutrition will be maintained Outcome: Progressing   

## 2020-01-27 ENCOUNTER — Other Ambulatory Visit: Payer: Self-pay | Admitting: General Surgery

## 2020-01-27 DIAGNOSIS — K668 Other specified disorders of peritoneum: Secondary | ICD-10-CM

## 2020-01-30 LAB — AEROBIC/ANAEROBIC CULTURE W GRAM STAIN (SURGICAL/DEEP WOUND)

## 2020-01-31 ENCOUNTER — Encounter: Payer: Self-pay | Admitting: Obstetrics and Gynecology

## 2020-01-31 ENCOUNTER — Ambulatory Visit (INDEPENDENT_AMBULATORY_CARE_PROVIDER_SITE_OTHER): Payer: Medicare Other | Admitting: Obstetrics and Gynecology

## 2020-01-31 ENCOUNTER — Other Ambulatory Visit: Payer: Self-pay

## 2020-01-31 DIAGNOSIS — Z90722 Acquired absence of ovaries, bilateral: Secondary | ICD-10-CM

## 2020-01-31 DIAGNOSIS — D271 Benign neoplasm of left ovary: Secondary | ICD-10-CM

## 2020-01-31 NOTE — Progress Notes (Signed)
   Pamela Whitehead [birth year] [id number]   SUBJECTIVE   REASON FOR APPOINTMENT Chief Complaint  Patient presents with  . Post Op check     HISTORY OF PRESENT ILLNESS Pamela Whitehead presents for routine 2 week post-operative follow up after a laparoscopic bilateral salpingo-oophorectomy with lysis of adhesions and pelvic washings performed on 01/20/2020 for a left ovarian cyst and prophylactic removal of the right adnexa and menopause.  She also had a concurrent laparoscopic cholecystectomy performed by Dr. Marlou Starks after my portion of the procedure was complete.  Her postoperative course from the gallbladder portion was complicated by a biloma that ultimately was drained by IR, please see Dr. Ethlyn Gallery notes for details.  She does still have an abdominal drain in place and having moderate abdominal discomfort but is tolerating her diet.  No vaginal bleeding.  OBJECTIVE  There were no vitals taken for this visit.   PHYSICAL EXAM General:  Patient in no acute distress.  Abdomen: Soft, non tender, non-distended.  Incisions at left lateral abdomen and umbilicus are clean, dry, intact, well healing, no erythema nor drainage.  Right incision is covered by the drain port dressing so it is not viewed today.  ASSESSMENT / PLAN  Routine 2 week post operative check.   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  Pelvic washings were negative.  I showed her some pictures taken during the laparoscopy and provided her with a copy.  From a gynecologic standpoint she has been doing well.  Further advice on postoperative management, restrictions, and recovery expectations from this point forward would be per general surgery's direction.  Joseph Pierini MD 01/31/20

## 2020-02-01 ENCOUNTER — Ambulatory Visit
Admission: RE | Admit: 2020-02-01 | Discharge: 2020-02-01 | Disposition: A | Discharge status: Home / Self Care | Payer: Medicare Other | Source: Ambulatory Visit | Attending: Radiology | Admitting: Radiology

## 2020-02-01 ENCOUNTER — Ambulatory Visit
Admission: RE | Admit: 2020-02-01 | Discharge: 2020-02-01 | Disposition: A | Discharge status: Home / Self Care | Payer: Medicare Other | Source: Ambulatory Visit | Attending: General Surgery | Admitting: General Surgery

## 2020-02-01 ENCOUNTER — Encounter: Payer: Self-pay | Admitting: Radiology

## 2020-02-01 DIAGNOSIS — Z8719 Personal history of other diseases of the digestive system: Secondary | ICD-10-CM | POA: Diagnosis not present

## 2020-02-01 DIAGNOSIS — R1011 Right upper quadrant pain: Secondary | ICD-10-CM | POA: Diagnosis not present

## 2020-02-01 DIAGNOSIS — T8189XA Other complications of procedures, not elsewhere classified, initial encounter: Secondary | ICD-10-CM

## 2020-02-01 DIAGNOSIS — Z90722 Acquired absence of ovaries, bilateral: Secondary | ICD-10-CM | POA: Diagnosis not present

## 2020-02-01 DIAGNOSIS — T797XXA Traumatic subcutaneous emphysema, initial encounter: Secondary | ICD-10-CM | POA: Diagnosis not present

## 2020-02-01 DIAGNOSIS — Z9049 Acquired absence of other specified parts of digestive tract: Secondary | ICD-10-CM | POA: Diagnosis not present

## 2020-02-01 DIAGNOSIS — K668 Other specified disorders of peritoneum: Secondary | ICD-10-CM

## 2020-02-01 DIAGNOSIS — T8182XA Emphysema (subcutaneous) resulting from a procedure, initial encounter: Secondary | ICD-10-CM | POA: Diagnosis not present

## 2020-02-01 DIAGNOSIS — J90 Pleural effusion, not elsewhere classified: Secondary | ICD-10-CM | POA: Diagnosis not present

## 2020-02-01 DIAGNOSIS — Z978 Presence of other specified devices: Secondary | ICD-10-CM | POA: Diagnosis not present

## 2020-02-01 DIAGNOSIS — K9189 Other postprocedural complications and disorders of digestive system: Secondary | ICD-10-CM | POA: Diagnosis not present

## 2020-02-01 DIAGNOSIS — Z434 Encounter for attention to other artificial openings of digestive tract: Secondary | ICD-10-CM | POA: Diagnosis not present

## 2020-02-01 HISTORY — PX: IR RADIOLOGIST EVAL & MGMT: IMG5224

## 2020-02-01 MED ORDER — IOPAMIDOL (ISOVUE-300) INJECTION 61%
100.0000 mL | Freq: Once | INTRAVENOUS | Status: AC | PRN
  Administered 2020-02-01: 100 mL via INTRAVENOUS

## 2020-02-01 NOTE — Progress Notes (Signed)
Referring Physician(s): Allred,Darrell K  Chief Complaint: The patient is seen in follow up today s/p biloma drain placement on 01/25/20  History of present illness: 69 year old female with history of combined laparoscopic bilateral salpingo-oophorectomy, lysis of adhesions, and cholecystectomy on 14/97/02 complicated post-operatively by a right upper quadrant biloma.  Percutaneous biloma drain was placed on 01/25/20.  She was discharged home the following day.    The drain output has gradually decreased since placement, and slowed to trace drainage over the past 4-5 days.  No fevers, chills, worsening abdominal pain.    Past Medical History:  Diagnosis Date  . Anaphylactic reaction    Unknown cause  . 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 Atypical squamous cell changes of undetermined significance (ASCUS) on vaginal cytology 12/2015, 05/2017   Negative high-risk HPV screen  . 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 DES exposure in utero   . Endometriosis   . Hypertension   . Kidney infection    June 2014  . Peptic ulcer    currently Oct 2015  . PONV (postoperative nausea and vomiting)   . Spondylisthesis   . Ulcer   . 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 BREAST EXCISIONAL BIOPSY Left 2011   Benign  . BREAST SURGERY  02/25/2010   left breast biopsy ..for left breast mass. . . for [place]  . CHOLECYSTECTOMY N/A 01/20/2020   Procedure: LAPAROSCOPIC CHOLECYSTECTOMY WITH INTRAOPERATIVE CHOLANGIOGRAM;  Surgeon: Jovita Kussmaul, MD;  Location: [facility];  Service: General;  Laterality: N/A;  . ECTOPIC PREGNANCY SURGERY    . ERCP N/A 01/21/2020   Procedure: ENDOSCOPIC RETROGRADE CHOLANGIOPANCREATOGRAPHY (ERCP);  Surgeon: Ronnette Juniper, MD;  Location: [facility];  Service: Gastroenterology;  Laterality: N/A;  .  KNEE SURGERY  2003  . LAPAROSCOPIC BILATERAL SALPINGO OOPHERECTOMY Bilateral 01/20/2020   Procedure: LAPAROSCOPIC BILATERAL SALPINGO OOPHORECTOMY;  Surgeon: Joseph Pierini, MD;  Location: [facility];  Service: Gynecology;  Laterality: Bilateral;  Dr. Delilah Shan with be working in Dr. Ethlyn Gallery block time. To start at 7:30am requests one hour  . PANCREATIC STENT PLACEMENT  01/21/2020   Procedure: PANCREATIC STENT PLACEMENT;  Surgeon: Ronnette Juniper, MD;  Location: [facility];  Service: Gastroenterology;;  . RECTAL SURGERY    . REMOVAL OF STONES  01/21/2020   Procedure: REMOVAL OF STONES;  Surgeon: Ronnette Juniper, MD;  Location: [facility] ENDOSCOPY;  Service: Gastroenterology;;  . Joan Mayans  01/21/2020   Procedure: Joan Mayans;  Surgeon: Ronnette Juniper, MD;  Location: [facility];  Service: Gastroenterology;;  . TONSILLECTOMY    . VAGINAL HYSTERECTOMY  1993   vaginal hysterectomy    Allergies: Celebrex [celecoxib], Penicillins, and Morphine and related  Medications: Prior to Admission medications   Medication Sig Start Date End Date Taking? Authorizing Provider  acetaminophen (TYLENOL) 500 MG tablet Take 500 mg by mouth 2 (two) times daily as needed for headache.    [provider]  ALPRAZolam Duanne Moron) 1 MG tablet Take 0.25-0.5 mg by mouth daily as needed for anxiety.     [provider]  Ascorbic Acid (VITAMIN C PO) Take 1 capsule by mouth daily.    [provider]  Cholecalciferol (VITAMIN D3 GUMMIES PO) Take 2 tablets by mouth daily.    [provider]  ELDERBERRY PO Take 1 tablet by mouth daily. With zinc  [provider]  EPINEPHrine 0.3 mg/0.3 mL IJ SOAJ injection Inject 0.3 mLs (0.3 mg total) into the muscle as needed for anaphylaxis. 06/14/18   Fontaine, Belinda Block, MD  estradiol (ESTRACE VAGINAL) 0.1 MG/GM vaginal cream Place 1 Applicatorful vaginally at bedtime. Nightly for 1 month then twice weekly after that. Patient taking differently: Place 1  Applicatorful vaginally at bedtime as needed (dryness).  09/07/19   Joseph Pierini, MD  estradiol (ESTRACE) 1 MG tablet TAKE 1 TABLET(1 MG) BY MOUTH DAILY Patient taking differently: Take 1 mg by mouth daily.  09/07/19   Joseph Pierini, MD  hydrochlorothiazide (HYDRODIURIL) 25 MG tablet Take 25 mg by mouth daily.    [provider]  loratadine (CLARITIN) 10 MG tablet Take 10 mg by mouth daily.    [provider]  metoprolol succinate (TOPROL-XL) 50 MG 24 hr tablet Take 100 mg by mouth at bedtime. Take with or immediately following a meal.    [provider]  mometasone (NASONEX) 50 MCG/ACT nasal spray Place 1 spray into the nose daily.     [provider]  Multiple Vitamins-Minerals (MULTIVITAMIN GUMMIES ADULT PO) Take 1 capsule by mouth daily.    [provider]  omeprazole (PRILOSEC) 40 MG capsule Take 80 mg by mouth daily.     [provider]  oxyCODONE (OXY IR/ROXICODONE) 5 MG immediate release tablet Take 1-2 tablets (5-10 mg total) by mouth every 6 (six) hours as needed for moderate pain, severe pain or breakthrough pain. Patient not taking: Reported on 01/31/2020 01/20/20   Autumn Messing III, MD  Probiotic Product (CULTURELLE PROBIOTICS PO) Take 1 capsule by mouth daily.    [provider]  traMADol (ULTRAM) 50 MG tablet Take 25 mg by mouth daily as needed for pain. 12/06/19   [provider]  triamcinolone cream (KENALOG) 0.5 % Apply 1 application topically daily as needed for rash. 07/26/19   [provider]  valsartan (DIOVAN) 80 MG tablet Take 80 mg by mouth daily.    [provider]  zolpidem (AMBIEN) 5 MG tablet TAKE 1 TABLET(5 MG) BY MOUTH AT BEDTIME AS NEEDED FOR SLEEP Patient taking differently: Take 2.5 mg by mouth at bedtime as needed for sleep.  09/07/19   Joseph Pierini, MD     Family History  Problem Relation Age of Onset  . Hypertension Mother   . Dementia Mother   . Stroke Mother   .  Heart disease Father   . Breast cancer Maternal Aunt        Age 15's  . Heart disease Brother   . Melanoma Brother   . Stroke Brother     Social History   Socioeconomic History  . Marital status: Divorced    Spouse name: Not on file  . Number of children: Not on file  . Years of education: Not on file  . Highest education level: Not on file  Occupational History  . Not on file  Tobacco Use  . Smoking status: Never Smoker  . Smokeless tobacco: Never Used  Vaping Use  . Vaping Use: Never used  Substance and Sexual Activity  . Alcohol use: Yes    Alcohol/week: 0.0 standard drinks    Comment: rare  . Drug use: No  . Sexual activity: Yes    Birth control/protection: Surgical    Comment: HYST-1st intercourse 74 yo--5 partners  Other Topics Concern  . Not on file  Social History Narrative  . Not on file   Social  Determinants of Health   Financial Resource Strain:   . Difficulty of Paying Living Expenses: Not on file  Food Insecurity:   . Worried About Charity fundraiser in the Last Year: Not on file  . Ran Out of Food in the Last Year: Not on file  Transportation Needs:   . Lack of Transportation (Medical): Not on file  . Lack of Transportation (Non-Medical): Not on file  Physical Activity:   . Days of Exercise per Week: Not on file  . Minutes of Exercise per Session: Not on file  Stress:   . Feeling of Stress : Not on file  Social Connections:   . Frequency of Communication with Friends and Family: Not on file  . Frequency of Social Gatherings with Friends and Family: Not on file  . Attends Religious Services: Not on file  . Active Member of Clubs or Organizations: Not on file  . Attends Archivist Meetings: Not on file  . Marital Status: Not on file     Vital Signs: There were no vitals taken for this visit.  Physical Exam Constitutional:      General: She is not in acute distress. HENT:     Head: Normocephalic.     Mouth/Throat:     Mouth:  Mucous membranes are moist.  Pulmonary:     Effort: Pulmonary effort is normal. No respiratory distress.  Abdominal:     General: There is no distension.     Tenderness: There is no abdominal tenderness.     Comments: RUQ drain in place with trace serous fluid in bulb.  Skin:    General: Skin is warm and dry.  Neurological:     Mental Status: She is alert and oriented to person, place, and time.     Imaging: No results found.  Labs:  CBC: Recent Labs    01/20/20 1517 01/21/20 0127 01/22/20 0300 01/23/20 0256  WBC 12.5* 10.0 10.4 8.0  HGB 12.6 12.2 10.4* 10.8*  HCT 37.6 36.6 31.6* 33.2*  PLT 298 295 232 232    COAGS: Recent Labs    01/25/20 1115  INR 1.2    BMP: Recent Labs    09/18/19 1003 01/10/20 1336 01/21/20 0127 01/22/20 0300 01/23/20 0256 01/23/20 2140  NA 137   < > 140 141 138 136  K 3.5   < > 4.2 3.9 3.8 3.4*  CL 98   < > 106 111 107 103  CO2 26   < > 26 24 24 24   GLUCOSE 104*   < > 134* 119* 104* 111*  BUN 22   < > 17 14 8 9   CALCIUM 9.3   < > 8.8* 8.4* 8.4* 8.5*  CREATININE 0.98   < > 1.27* 0.95 0.75 0.76  GFRNONAA 59*   < > 46* >60 >60 >60  GFRAA >60  --   --   --   --   --    < > = values in this interval not displayed.    LIVER FUNCTION TESTS: Recent Labs    01/21/20 0127 01/22/20 0300 01/23/20 0256 01/23/20 2140  BILITOT 0.8 1.0 1.3* 1.4*  AST 59* 47* 34 31  ALT 61* 55* 44 41  ALKPHOS 210* 165* 177* 200*  PROT 5.6* 4.9* 4.9* 5.1*  ALBUMIN 3.1* 2.7* 2.6* 2.7*    Assessment and Plan:  69 year old female with history of post-operative biloma formation status post laparoscopic cholecystectomy, bilateral salpino-oophorectomy, and lysis of adhesions on  01/20/20.  Drain placed on 01/25/20, now with minimal output.  She is progressing well post-operatively.  CT demonstrates no discernable fluid collection in RUQ.  She has residual small right pleural effusion with associated passive atelectasis.  She plans to continue to use her  incentive spirometer.  Fluoroscopic drain injection demonstrates no communication with the biliary system.  Drain was removed successfully.    Follow up in IR clinic PRN.  Electronically Signed: Suzette Battiest 02/01/2020, 10:48 AM   I spent a total of 25 Minutes in face to face in clinical consultation, greater than 50% of which was counseling/coordinating care for biloma drain.

## 2020-02-01 NOTE — Discharge Summary (Signed)
Physician Discharge Summary  Patient ID: Pamela Whitehead MRN: [id number] DOB/AGE: [birth year] 69 y.o.  Admit date: 01/20/2020 Discharge date: 02/01/2020  Admission Diagnoses:  Discharge Diagnoses:  Active Problems:   Gallstones   Malnutrition of moderate degree   Discharged Condition: good  Hospital Course: the pt underwent lap chole and lap oophorectomy. Her postop course was complicated by a small biloma but no obvious evidence of bile leak. This was drained and she improved to the point where she could be d/c home  Consults: None  Significant Diagnostic Studies: IR drain  Treatments: surgery: as above  Discharge Exam: Blood pressure (!) 147/80, pulse 89, temperature 99 F (37.2 C), temperature source Oral, resp. rate 20, height 5\' 10"  (1.778 m), weight 74.8 kg, SpO2 93 %. GI: soft with mild RUQ tenderness.drain in place  Disposition: Discharge disposition: 01-Home or Self Care       Discharge Instructions    Call MD for:  difficulty breathing, headache or visual disturbances   Complete by: As directed    Call MD for:  extreme fatigue   Complete by: As directed    Call MD for:  hives   Complete by: As directed    Call MD for:  persistant dizziness or light-headedness   Complete by: As directed    Call MD for:  persistant nausea and vomiting   Complete by: As directed    Call MD for:  redness, tenderness, or signs of infection (pain, swelling, redness, odor or green/yellow discharge around incision site)   Complete by: As directed    Call MD for:  severe uncontrolled pain   Complete by: As directed    Call MD for:  temperature >100.4   Complete by: As directed    Diet - low sodium heart healthy   Complete by: As directed    Discharge instructions   Complete by: As directed    May shower. No heavy lifting. Low fat diet. Empty drain daily and record output   Increase activity slowly   Complete by: As directed    No wound care   Complete by: As directed       Allergies as of 01/26/2020      Reactions   Celebrex [celecoxib] Anaphylaxis   Penicillins Itching, Other (See Comments)   Morphine And Related Itching, Rash   After receiving multiple doses IV. Subsided without intervention      Medication List    TAKE these medications   acetaminophen 500 MG tablet Commonly known as: TYLENOL Take 500 mg by mouth 2 (two) times daily as needed for headache.   ALPRAZolam 1 MG tablet Commonly known as: XANAX Take 0.25-0.5 mg by mouth daily as needed for anxiety.   CULTURELLE PROBIOTICS PO Take 1 capsule by mouth daily.   ELDERBERRY PO Take 1 tablet by mouth daily. With zinc   EPINEPHrine 0.3 mg/0.3 mL Soaj injection Commonly known as: EPI-PEN Inject 0.3 mLs (0.3 mg total) into the muscle as needed for anaphylaxis.   estradiol 0.1 MG/GM vaginal cream Commonly known as: ESTRACE VAGINAL Place 1 Applicatorful vaginally at bedtime. Nightly for 1 month then twice weekly after that. What changed:   when to take this  reasons to take this  additional instructions   estradiol 1 MG tablet Commonly known as: ESTRACE TAKE 1 TABLET(1 MG) BY MOUTH DAILY What changed:   how much to take  how to take this  when to take this  additional instructions   hydrochlorothiazide 25 MG tablet  Commonly known as: HYDRODIURIL Take 25 mg by mouth daily.   loratadine 10 MG tablet Commonly known as: CLARITIN Take 10 mg by mouth daily.   metoprolol succinate 50 MG 24 hr tablet Commonly known as: TOPROL-XL Take 100 mg by mouth at bedtime. Take with or immediately following a meal.   MULTIVITAMIN GUMMIES ADULT PO Take 1 capsule by mouth daily.   Nasonex 50 MCG/ACT nasal spray Generic drug: mometasone Place 1 spray into the nose daily.   omeprazole 40 MG capsule Commonly known as: PRILOSEC Take 80 mg by mouth daily.   oxyCODONE 5 MG immediate release tablet Commonly known as: Oxy IR/ROXICODONE Take 1-2 tablets (5-10 mg total) by mouth  every 6 (six) hours as needed for moderate pain, severe pain or breakthrough pain.   traMADol 50 MG tablet Commonly known as: ULTRAM Take 25 mg by mouth daily as needed for pain.   triamcinolone cream 0.5 % Commonly known as: KENALOG Apply 1 application topically daily as needed for rash.   valsartan 80 MG tablet Commonly known as: DIOVAN Take 80 mg by mouth daily.   VITAMIN C PO Take 1 capsule by mouth daily.   VITAMIN D3 GUMMIES PO Take 2 tablets by mouth daily.   zolpidem 5 MG tablet Commonly known as: AMBIEN TAKE 1 TABLET(5 MG) BY MOUTH AT BEDTIME AS NEEDED FOR SLEEP What changed:   how much to take  how to take this  when to take this  reasons to take this  additional instructions       Follow-up Information    Joseph Pierini, MD. Schedule an appointment as soon as possible for a visit in 2 weeks.   Specialty: Obstetrics and Gynecology Contact information: [place] [address] [phone]        Autumn Messing III, MD In 3 weeks.   Specialty: General Surgery Contact information: [address] [place] [address] [phone]        [facility] Follow up in 1 week(s).   Specialty: Radiology Contact information: [address] [id number] [place] [phone]              Signed: Autumn Messing III 02/01/2020, 2:05 PM

## 2020-02-27 ENCOUNTER — Other Ambulatory Visit: Payer: Self-pay | Admitting: Obstetrics and Gynecology

## 2020-03-05 ENCOUNTER — Other Ambulatory Visit: Payer: Self-pay | Admitting: Gastroenterology

## 2020-03-05 ENCOUNTER — Ambulatory Visit
Admission: RE | Admit: 2020-03-05 | Discharge: 2020-03-05 | Disposition: A | Discharge status: Home / Self Care | Payer: Medicare Other | Source: Ambulatory Visit | Attending: Gastroenterology | Admitting: Gastroenterology

## 2020-03-05 DIAGNOSIS — R932 Abnormal findings on diagnostic imaging of liver and biliary tract: Secondary | ICD-10-CM | POA: Diagnosis not present

## 2020-03-05 DIAGNOSIS — Z9049 Acquired absence of other specified parts of digestive tract: Secondary | ICD-10-CM | POA: Diagnosis not present

## 2020-03-07 DIAGNOSIS — J309 Allergic rhinitis, unspecified: Secondary | ICD-10-CM | POA: Diagnosis not present

## 2020-03-15 DIAGNOSIS — L814 Other melanin hyperpigmentation: Secondary | ICD-10-CM | POA: Diagnosis not present

## 2020-03-15 DIAGNOSIS — D225 Melanocytic nevi of trunk: Secondary | ICD-10-CM | POA: Diagnosis not present

## 2020-03-15 DIAGNOSIS — L821 Other seborrheic keratosis: Secondary | ICD-10-CM | POA: Diagnosis not present

## 2020-03-15 DIAGNOSIS — Z85828 Personal history of other malignant neoplasm of skin: Secondary | ICD-10-CM | POA: Diagnosis not present

## 2020-03-15 DIAGNOSIS — D1801 Hemangioma of skin and subcutaneous tissue: Secondary | ICD-10-CM | POA: Diagnosis not present

## 2020-03-15 DIAGNOSIS — L905 Scar conditions and fibrosis of skin: Secondary | ICD-10-CM | POA: Diagnosis not present

## 2020-05-29 DIAGNOSIS — Z9109 Other allergy status, other than to drugs and biological substances: Secondary | ICD-10-CM | POA: Diagnosis not present

## 2020-06-05 DIAGNOSIS — Z0000 Encounter for general adult medical examination without abnormal findings: Secondary | ICD-10-CM | POA: Diagnosis not present

## 2020-06-05 DIAGNOSIS — E782 Mixed hyperlipidemia: Secondary | ICD-10-CM | POA: Diagnosis not present

## 2020-06-05 DIAGNOSIS — Z136 Encounter for screening for cardiovascular disorders: Secondary | ICD-10-CM | POA: Diagnosis not present

## 2020-06-09 DIAGNOSIS — Z23 Encounter for immunization: Secondary | ICD-10-CM | POA: Diagnosis not present

## 2020-08-05 DIAGNOSIS — Z1159 Encounter for screening for other viral diseases: Secondary | ICD-10-CM | POA: Diagnosis not present

## 2020-08-28 ENCOUNTER — Other Ambulatory Visit: Payer: Self-pay | Admitting: Obstetrics and Gynecology

## 2020-08-28 DIAGNOSIS — Z09 Encounter for follow-up examination after completed treatment for conditions other than malignant neoplasm: Secondary | ICD-10-CM

## 2020-09-07 ENCOUNTER — Encounter: Payer: Medicare Other | Admitting: Obstetrics and Gynecology

## 2020-09-11 ENCOUNTER — Other Ambulatory Visit: Payer: Self-pay

## 2020-09-11 ENCOUNTER — Other Ambulatory Visit (HOSPITAL_COMMUNITY)
Admission: RE | Admit: 2020-09-11 | Discharge: 2020-09-11 | Disposition: A | Discharge status: Home / Self Care | Payer: Medicare Other | Source: Ambulatory Visit | Attending: Obstetrics and Gynecology | Admitting: Obstetrics and Gynecology

## 2020-09-11 ENCOUNTER — Ambulatory Visit (INDEPENDENT_AMBULATORY_CARE_PROVIDER_SITE_OTHER): Payer: Medicare Other | Admitting: Nurse Practitioner

## 2020-09-11 ENCOUNTER — Encounter: Payer: Self-pay | Admitting: Nurse Practitioner

## 2020-09-11 VITALS — BP 120/74 | Ht 70.0 in | Wt 164.0 lb

## 2020-09-11 DIAGNOSIS — Z79890 Hormone replacement therapy (postmenopausal): Secondary | ICD-10-CM | POA: Diagnosis not present

## 2020-09-11 DIAGNOSIS — Z01419 Encounter for gynecological examination (general) (routine) without abnormal findings: Secondary | ICD-10-CM | POA: Diagnosis not present

## 2020-09-11 DIAGNOSIS — Z9189 Other specified personal risk factors, not elsewhere classified: Secondary | ICD-10-CM | POA: Diagnosis not present

## 2020-09-11 DIAGNOSIS — Z1272 Encounter for screening for malignant neoplasm of vagina: Secondary | ICD-10-CM

## 2020-09-11 DIAGNOSIS — G4700 Insomnia, unspecified: Secondary | ICD-10-CM | POA: Diagnosis not present

## 2020-09-11 DIAGNOSIS — N952 Postmenopausal atrophic vaginitis: Secondary | ICD-10-CM | POA: Diagnosis not present

## 2020-09-11 MED ORDER — ZOLPIDEM TARTRATE 5 MG PO TABS: 5.0000 mg | ORAL_TABLET | Freq: Every evening | ORAL | 5 refills | Status: DC | PRN

## 2020-09-11 MED ORDER — ESTRADIOL 1 MG PO TABS: ORAL_TABLET | ORAL | 4 refills | Status: DC

## 2020-09-11 NOTE — Progress Notes (Signed)
Pamela Whitehead [birth year] [id number]   History:  70 y.o. G1P0010 presents for breast and pelvic exam. No GYN complaints. Postmenopausal - on ERT. Has tried to wean and does not tolerate due to headaches and return of vasomotor symptoms. 01/2020 BSO for left cyst and prophylaxis, 1993 TVH for endometriosis. Using vaginal estrogen for atrophic vaginitis. ASCUS negative HPV 2017/2018/2019, 2020 LGSIL with negative colposcopy, 2021 ASCUS negative HPV. 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   Gynecologic History No LMP recorded. Patient has had a hysterectomy.   Contraception: status post hysterectomy  Health Maintenance Last Pap: 09/07/2019. Results were: ASCUS/HPV negative Last mammogram: 09/16/2019. Results were: stable benign right breast mass Last colonoscopy: 2014 Last Dexa: 06/14/2017. Results were: Normal  Past medical history, past surgical history, family history and social history were all reviewed and documented in the EPIC chart. 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 Engaged.   ROS:  A ROS was performed and pertinent positives and negatives are included.  Exam:  Vitals:   09/11/20 1330  BP: 120/74  Weight: 164 lb (74.4 kg)  Height: 5\' 10"  (1.778 m)   Body mass index is 23.53 kg/m.  General appearance:  Normal Thyroid:  Symmetrical, normal in size, without palpable masses or nodularity. Respiratory  Auscultation:  Clear without wheezing or rhonchi Cardiovascular  Auscultation:  Regular rate, without rubs, murmurs or gallops  Edema/varicosities:  Not grossly evident Abdominal  Soft,nontender, without masses, guarding or rebound.  Liver/spleen:  No organomegaly noted  Hernia:  None appreciated  Skin  Inspection:  Grossly normal Breasts: Examined lying and sitting.   Right: Without masses, retractions, nipple discharge or axillary adenopathy.   Left: Without masses, retractions, nipple discharge or axillary adenopathy. Genitourinary   Inguinal/mons:  Normal without  inguinal adenopathy  External genitalia:  Normal appearing vulva with no masses, tenderness, or lesions  BUS/Urethra/Skene's glands:  Normal  Vagina:  Normal appearing with normal color and discharge, no lesions  Cervix:  Absent  Uterus:  Absent  Anus and perineum: Normal  Digital rectal exam: Declined  Assessment/Plan:  70 y.o. G1P0010 for breast and pelvic exam.   Well female exam with routine gynecological exam - Plan: Cytology - PAP( [place]). Education provided on SBEs, importance of preventative screenings, current guidelines, high calcium diet, regular exercise, and multivitamin daily. Labs with PCP.   DES exposure in utero - Plan: Cytology - PAP( [place]). ASCUS negative HPV 2017/2018/2019, 2020 LGSIL with negative colposcopy, 2021 ASCUS negative HPV with recommendations to repeat pap in 1 year.   Postmenopausal atrophic vaginitis - vaginal estrogen as needed for dryness/irritation.   Hormone replacement therapy - Plan: estradiol (ESTRACE) 1 MG tablet daily. She has tried to wean but does not tolerate due to headaches and return of vasomotor symptoms. She is aware of the risk for blood clots, stroke, heart attack, and breast cancer. She would like to continue. Refill x 1 year provided.   Insomnia, unspecified type - Plan: zolpidem (AMBIEN) 5 MG tablet as needed for sleep. She uses intermittently and usually takes 1/2 tablet. Refill provided.   Screening for breast cancer - Is being followed for stable benign right breast mass. If stability still present at next ultrasound she will return to annual mammograms (scheduled 09/21/2020). Normal breast exam today.  Screening for colon cancer - 2014 colonoscopy. Will repeat at GI's recommended interval.   Screening for osteoporosis - Normal DXA in 2019, will repeat at 5-year interval. Very active and works with trainer 3x/week.   Return  in 1 year for annual.    Tamela Gammon DNP, 1:56 PM 09/11/2020

## 2020-09-13 LAB — CYTOLOGY - PAP

## 2020-09-18 ENCOUNTER — Other Ambulatory Visit: Payer: Self-pay

## 2020-09-18 ENCOUNTER — Other Ambulatory Visit: Payer: Self-pay | Admitting: Nurse Practitioner

## 2020-09-18 ENCOUNTER — Other Ambulatory Visit: Payer: Self-pay | Admitting: Obstetrics and Gynecology

## 2020-09-18 DIAGNOSIS — N63 Unspecified lump in unspecified breast: Secondary | ICD-10-CM

## 2020-09-18 DIAGNOSIS — R87612 Low grade squamous intraepithelial lesion on cytologic smear of cervix (LGSIL): Secondary | ICD-10-CM

## 2020-09-20 ENCOUNTER — Encounter: Payer: Self-pay | Admitting: Obstetrics and Gynecology

## 2020-09-20 ENCOUNTER — Ambulatory Visit (INDEPENDENT_AMBULATORY_CARE_PROVIDER_SITE_OTHER): Payer: Medicare Other | Admitting: Obstetrics and Gynecology

## 2020-09-20 ENCOUNTER — Other Ambulatory Visit: Payer: Self-pay

## 2020-09-20 ENCOUNTER — Other Ambulatory Visit (HOSPITAL_COMMUNITY)
Admission: RE | Admit: 2020-09-20 | Discharge: 2020-09-20 | Disposition: A | Discharge status: Home / Self Care | Payer: Medicare Other | Source: Ambulatory Visit | Attending: Obstetrics and Gynecology | Admitting: Obstetrics and Gynecology

## 2020-09-20 DIAGNOSIS — R87622 Low grade squamous intraepithelial lesion on cytologic smear of vagina (LGSIL): Secondary | ICD-10-CM | POA: Diagnosis not present

## 2020-09-20 DIAGNOSIS — N890 Mild vaginal dysplasia: Secondary | ICD-10-CM

## 2020-09-20 NOTE — Patient Instructions (Signed)
Colposcopy, Care After This sheet gives you information about how to care for yourself after your procedure. Your doctor may also give you more specific instructions. If youhave problems or questions, contact your doctor. What can I expect after the procedure? If you did not have a sample of your tissue taken out (did not have a biopsy), you may only have some spotting of blood for a few days. You can go back toyour normal activities. If you had a sample of your tissue taken out, it is common to have: Soreness and mild pain. These may last for a few days. A light-headed feeling. Mild bleeding or fluid (discharge) coming from your vagina. The fluid will look dark and grainy. You may have this for a few days. The fluid may be caused by a liquid that was used during your procedure. You may need to wear a sanitary pad. Spotting of blood for at least 48 hours after the procedure. Follow these instructions at home: Medicines Take over-the-counter and prescription medicines only as told by your doctor. Ask your doctor what medicines you can start taking again. This is very important if you take blood thinners. Activity Limit your activity for the first day after your procedure as told by your doctor. For at least 3 days, or for as long as told by your doctor, avoid: Douching. Using tampons. Having sex. Return to your normal activities as told by your doctor. Ask your doctor what activities are safe for you. General instructions  Drink enough fluid to keep your pee (urine) pale yellow. Ask your doctor if you may take baths, swim, or use a hot tub. You may take showers. If you use birth control (contraception), keep using it. Keep all follow-up visits as told by your doctor. This is important.  Contact a doctor if: You get a skin rash. Get help right away if: You bleed a lot from your vagina. A lot of bleeding means you use more than one pad an hour for 2 hours in a row. You have clumps of  blood (blood clots) coming from your vagina. You have a fever or chills. You have signs of infection. This may be fluid coming from your vagina that is: Different than normal. Yellow. Bad-smelling. You have very bad pain or cramps in your lower belly that do not get better with medicine. You faint. Summary If you did not have a sample of your tissue taken out, you may only have some spotting of blood for a few days. You can go back to your normal activities. If you had a sample of your tissue taken out, it is common to have mild pain for a few days and spotting for 48 hours. Avoid douching, using tampons, and having sex for at least 3 days after the procedure or for as long as told. Get help right away if you have a lot of bleeding, very bad pain, or signs of infection. This information is not intended to replace advice given to you by your health care provider. Make sure you discuss any questions you have with your healthcare provider. Document Revised: 12/27/2019 Document Reviewed: 02/23/2019 Elsevier Patient Education  2022 [employer]  

## 2020-09-20 NOTE — Progress Notes (Signed)
GYNECOLOGY  VISIT   HPI: 70 y.o.   Divorced  Caucasian  female   G1P0010 with No LMP recorded. Patient has had a hysterectomy.   here for   colposcopy due to pap showing LGSIL.   Uses vaginal estrogen cream.   Hx DES exposure.   Several ASCUS paps with negative HR HPV.  Pap 2020 LGSIL.  Colposcopy normal.  Pap 2021 ASCUS, negative HR HPV.  GYNECOLOGIC HISTORY: No LMP recorded. Patient has had a hysterectomy.  For endometriosis.  Contraception:none Menopausal hormone therapy:  Estradiol Last mammogram:  09-16-19 normal Last pap smear:   09-11-20 LGSIL        OB History     Gravida  1   Para  0   Term      Preterm      AB  1   Living  0      SAB  0   IAB      Ectopic  1   Multiple      Live Births                 Patient Active Problem List   Diagnosis Date Noted   Malnutrition of moderate degree 01/25/2020   Gallstones 01/20/2020   Microscopic hematuria 04/29/2011   Hypertension    Ulcer    Anaphylactic reaction     Past Medical History:  Diagnosis Date   Anaphylactic reaction    Unknown cause   ASCUS with positive high risk HPV 05/2015   Atypical hyperplasia of breast    pseudoangiomatous stromal hyperplasia of left breast   Atypical squamous cell changes of undetermined significance (ASCUS) on vaginal cytology 12/2015, 05/2017   Negative high-risk HPV screen   Cystadenofibroma of left ovary 01/2020   DES exposure in utero    Endometriosis    Hypertension    Kidney infection    June 2014   Peptic ulcer    currently Oct 2015   PONV (postoperative nausea and vomiting)    Spondylisthesis    Ulcer    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 . . for [place] N/A 01/20/2020   Procedure: LAPAROSCOPIC CHOLECYSTECTOMY WITH INTRAOPERATIVE  CHOLANGIOGRAM;  Surgeon: Jovita Kussmaul, MD;  Location: [place];  Service: General;  Laterality: N/A;   ECTOPIC PREGNANCY SURGERY     ERCP N/A 01/21/2020   Procedure: ENDOSCOPIC RETROGRADE CHOLANGIOPANCREATOGRAPHY (ERCP);  Surgeon: Ronnette Juniper, MD;  Location: [place];  Service: Gastroenterology;  Laterality: N/A;   IR RADIOLOGIST EVAL & MGMT  02/01/2020   KNEE SURGERY  2003   LAPAROSCOPIC BILATERAL SALPINGO OOPHERECTOMY Bilateral 01/20/2020   Procedure: LAPAROSCOPIC BILATERAL SALPINGO OOPHORECTOMY;  Surgeon: Joseph Pierini, MD;  Location: [place];  Service: Gynecology;  Laterality: Bilateral;  Dr. Delilah Shan with be working in Dr. Ethlyn Gallery block time. To start at 7:30am requests one hour   PANCREATIC STENT PLACEMENT  01/21/2020   Procedure: PANCREATIC STENT PLACEMENT;  Surgeon: Ronnette Juniper, MD;  Location: [facility] ENDOSCOPY;  Service: Gastroenterology;;   RECTAL SURGERY     REMOVAL OF STONES  01/21/2020   Procedure: REMOVAL OF STONES;  Surgeon: Ronnette Juniper, MD;  Location: [facility] ENDOSCOPY;  Service: Gastroenterology;;   Joan Mayans  01/21/2020   Procedure: Joan Mayans;  Surgeon: Ronnette Juniper, MD;  Location: [place];  Service: Gastroenterology;;   TONSILLECTOMY     VAGINAL HYSTERECTOMY  1993   vaginal hysterectomy    Current Outpatient Medications  Medication Sig Dispense Refill   acetaminophen (TYLENOL) 500 MG tablet Take 500 mg by mouth 2 (two) times daily as needed for headache.     ALPRAZolam (XANAX) 1 MG tablet Take 0.25-0.5 mg by mouth daily as needed for anxiety.      Ascorbic Acid (VITAMIN C PO) Take 1 capsule by mouth daily.     Cholecalciferol (VITAMIN D3 GUMMIES PO) Take 2 tablets by mouth daily.     ELDERBERRY PO Take 1 tablet by mouth daily. With zinc     EPINEPHrine 0.3 mg/0.3 mL IJ SOAJ injection Inject 0.3 mLs (0.3 mg total) into the muscle as needed for anaphylaxis. 1 Device 0   estradiol (ESTRACE VAGINAL) 0.1 MG/GM vaginal cream Place 1 Applicatorful vaginally at bedtime.  Nightly for 1 month then twice weekly after that. (Patient taking differently: Place 1 Applicatorful vaginally at bedtime as needed (dryness).) 42.5 g 12   estradiol (ESTRACE) 1 MG tablet TAKE 1 TABLET(1 MG) BY MOUTH DAILY 90 tablet 4   hydrochlorothiazide (HYDRODIURIL) 25 MG tablet Take 25 mg by mouth daily.     loratadine (CLARITIN) 10 MG tablet Take 10 mg by mouth daily.     metoprolol succinate (TOPROL-XL) 50 MG 24 hr tablet Take 100 mg by mouth at bedtime. Take with or immediately following a meal.     mometasone (NASONEX) 50 MCG/ACT nasal spray Place 1 spray into the nose daily.      Multiple Vitamins-Minerals (MULTIVITAMIN GUMMIES ADULT PO) Take 1 capsule by mouth daily.     omeprazole (PRILOSEC) 40 MG capsule Take 80 mg by mouth daily.      Probiotic Product (CULTURELLE PROBIOTICS PO) Take 1 capsule by mouth daily.     triamcinolone cream (KENALOG) 0.5 % Apply 1 application topically daily as needed for rash.     valsartan (DIOVAN) 80 MG tablet Take 80 mg by mouth daily.     zolpidem (AMBIEN) 5 MG tablet Take 1 tablet (5 mg total) by mouth at bedtime as needed for sleep. 30 tablet 5   No current facility-administered medications for this visit.     ALLERGIES: Celebrex [celecoxib], Penicillins, and Morphine and related  Family History  Problem Relation Age of Onset   Hypertension Mother    Dementia Mother    Stroke Mother    Heart disease Father    Breast cancer Maternal Aunt        Age 21's   Heart disease Brother    Melanoma Brother    Stroke Brother     Social History   Socioeconomic History   Marital status: Divorced    Spouse name: Not on file   Number of children: Not on file   Years of education: Not on file   Highest education level: Not on file  Occupational History   Not on file  Tobacco Use   Smoking status: Never   Smokeless tobacco: Never  Vaping Use   Vaping Use: Never used  Substance and Sexual Activity   Alcohol use: Yes    Alcohol/week: 0.0  standard drinks    Comment: rare   Drug use: No   Sexual activity: Yes    Birth control/protection: Surgical    Comment: HYST-1st intercourse 18 yo--5 partners  Other Topics Concern   Not on file  Social History Narrative   Not on file  Social Determinants of Health   Financial Resource Strain: Not on file  Food Insecurity: Not on file  Transportation Needs: Not on file  Physical Activity: Not on file  Stress: Not on file  Social Connections: Not on file  Intimate Partner Violence: Not on file    Review of Systems  See HPI.  PHYSICAL EXAMINATION:    BP 124/78 (BP Location: Right Arm, Patient Position: Sitting, Cuff Size: Normal)     General appearance: alert, cooperative and appears stated age   Colposcopy of the vagina.  Consent for procedure.  Acetic acid placed.  Incandescent light and green filter used. Acetowhite change at the apex.  Lugol's placed.  Decreased uptake at apex.  Biopsy taken and sent to pathology.  Monsel's placed.  Minimal EBL. No complications.  Chaperone was present for exam:  Onalee Hua., CMA.  ASSESSMENT  Status post hysterectomy for endometriosis. Status post BSO - benign tubes and ovaries.  Hx DES exposure.  LGSIL pap.   PLAN  Fu biopsy result.  Plan for a minimum of pap in one year.

## 2020-09-21 ENCOUNTER — Ambulatory Visit
Admission: RE | Admit: 2020-09-21 | Discharge: 2020-09-21 | Disposition: A | Discharge status: Home / Self Care | Payer: Medicare Other | Source: Ambulatory Visit | Attending: Obstetrics and Gynecology | Admitting: Obstetrics and Gynecology

## 2020-09-21 DIAGNOSIS — N63 Unspecified lump in unspecified breast: Secondary | ICD-10-CM

## 2020-09-21 DIAGNOSIS — R922 Inconclusive mammogram: Secondary | ICD-10-CM | POA: Diagnosis not present

## 2020-09-21 LAB — SURGICAL PATHOLOGY

## 2020-09-23 ENCOUNTER — Other Ambulatory Visit: Payer: Self-pay | Admitting: Obstetrics and Gynecology

## 2020-09-23 MED ORDER — ESTRADIOL 0.1 MG/GM VA CREA: TOPICAL_CREAM | VAGINAL | 1 refills | Status: DC

## 2020-10-09 DIAGNOSIS — Z9109 Other allergy status, other than to drugs and biological substances: Secondary | ICD-10-CM | POA: Diagnosis not present

## 2020-11-08 DIAGNOSIS — M2011 Hallux valgus (acquired), right foot: Secondary | ICD-10-CM | POA: Diagnosis not present

## 2020-11-08 DIAGNOSIS — M21612 Bunion of left foot: Secondary | ICD-10-CM | POA: Diagnosis not present

## 2020-11-08 DIAGNOSIS — M2012 Hallux valgus (acquired), left foot: Secondary | ICD-10-CM | POA: Diagnosis not present

## 2020-11-08 DIAGNOSIS — M21611 Bunion of right foot: Secondary | ICD-10-CM | POA: Diagnosis not present

## 2020-12-05 DIAGNOSIS — Z23 Encounter for immunization: Secondary | ICD-10-CM | POA: Diagnosis not present

## 2020-12-12 DIAGNOSIS — Z23 Encounter for immunization: Secondary | ICD-10-CM | POA: Diagnosis not present

## 2020-12-14 DIAGNOSIS — I10 Essential (primary) hypertension: Secondary | ICD-10-CM | POA: Diagnosis not present

## 2020-12-14 DIAGNOSIS — E782 Mixed hyperlipidemia: Secondary | ICD-10-CM | POA: Diagnosis not present

## 2020-12-14 DIAGNOSIS — E559 Vitamin D deficiency, unspecified: Secondary | ICD-10-CM | POA: Diagnosis not present

## 2020-12-20 DIAGNOSIS — H25013 Cortical age-related cataract, bilateral: Secondary | ICD-10-CM | POA: Diagnosis not present

## 2020-12-20 DIAGNOSIS — H2513 Age-related nuclear cataract, bilateral: Secondary | ICD-10-CM | POA: Diagnosis not present

## 2020-12-21 DIAGNOSIS — R748 Abnormal levels of other serum enzymes: Secondary | ICD-10-CM | POA: Diagnosis not present

## 2020-12-21 DIAGNOSIS — I10 Essential (primary) hypertension: Secondary | ICD-10-CM | POA: Diagnosis not present

## 2020-12-21 DIAGNOSIS — Z1211 Encounter for screening for malignant neoplasm of colon: Secondary | ICD-10-CM | POA: Diagnosis not present

## 2020-12-21 DIAGNOSIS — R7989 Other specified abnormal findings of blood chemistry: Secondary | ICD-10-CM | POA: Diagnosis not present

## 2020-12-21 DIAGNOSIS — E782 Mixed hyperlipidemia: Secondary | ICD-10-CM | POA: Diagnosis not present

## 2020-12-21 DIAGNOSIS — R197 Diarrhea, unspecified: Secondary | ICD-10-CM | POA: Diagnosis not present

## 2021-01-16 DIAGNOSIS — L03032 Cellulitis of left toe: Secondary | ICD-10-CM | POA: Diagnosis not present

## 2021-01-16 DIAGNOSIS — L565 Disseminated superficial actinic porokeratosis (DSAP): Secondary | ICD-10-CM | POA: Diagnosis not present

## 2021-01-16 DIAGNOSIS — L600 Ingrowing nail: Secondary | ICD-10-CM | POA: Diagnosis not present

## 2021-01-30 DIAGNOSIS — L600 Ingrowing nail: Secondary | ICD-10-CM | POA: Diagnosis not present

## 2021-01-30 DIAGNOSIS — L565 Disseminated superficial actinic porokeratosis (DSAP): Secondary | ICD-10-CM | POA: Diagnosis not present

## 2021-01-30 DIAGNOSIS — D2371 Other benign neoplasm of skin of right lower limb, including hip: Secondary | ICD-10-CM | POA: Diagnosis not present

## 2021-01-30 DIAGNOSIS — L03032 Cellulitis of left toe: Secondary | ICD-10-CM | POA: Diagnosis not present

## 2021-04-23 DIAGNOSIS — D485 Neoplasm of uncertain behavior of skin: Secondary | ICD-10-CM | POA: Diagnosis not present

## 2021-04-23 DIAGNOSIS — Z08 Encounter for follow-up examination after completed treatment for malignant neoplasm: Secondary | ICD-10-CM | POA: Diagnosis not present

## 2021-04-23 DIAGNOSIS — L814 Other melanin hyperpigmentation: Secondary | ICD-10-CM | POA: Diagnosis not present

## 2021-04-23 DIAGNOSIS — L298 Other pruritus: Secondary | ICD-10-CM | POA: Diagnosis not present

## 2021-04-23 DIAGNOSIS — D225 Melanocytic nevi of trunk: Secondary | ICD-10-CM | POA: Diagnosis not present

## 2021-04-23 DIAGNOSIS — L570 Actinic keratosis: Secondary | ICD-10-CM | POA: Diagnosis not present

## 2021-04-23 DIAGNOSIS — Z85828 Personal history of other malignant neoplasm of skin: Secondary | ICD-10-CM | POA: Diagnosis not present

## 2021-04-23 DIAGNOSIS — L820 Inflamed seborrheic keratosis: Secondary | ICD-10-CM | POA: Diagnosis not present

## 2021-04-23 DIAGNOSIS — L821 Other seborrheic keratosis: Secondary | ICD-10-CM | POA: Diagnosis not present

## 2021-04-23 DIAGNOSIS — L538 Other specified erythematous conditions: Secondary | ICD-10-CM | POA: Diagnosis not present

## 2021-05-07 ENCOUNTER — Other Ambulatory Visit: Payer: Self-pay

## 2021-05-07 DIAGNOSIS — G4700 Insomnia, unspecified: Secondary | ICD-10-CM

## 2021-05-07 MED ORDER — ZOLPIDEM TARTRATE 5 MG PO TABS: 5.0000 mg | ORAL_TABLET | Freq: Every evening | ORAL | 2 refills | Status: DC | PRN

## 2021-05-07 NOTE — Telephone Encounter (Signed)
Spoke with patient and informed her. Rx sent. 

## 2021-05-07 NOTE — Telephone Encounter (Signed)
AEX was 09/11/20.  Patient requested 3 months worth of refills to hopefully take her until AEX.

## 2021-07-05 DIAGNOSIS — G479 Sleep disorder, unspecified: Secondary | ICD-10-CM | POA: Diagnosis not present

## 2021-07-05 DIAGNOSIS — K219 Gastro-esophageal reflux disease without esophagitis: Secondary | ICD-10-CM | POA: Diagnosis not present

## 2021-07-05 DIAGNOSIS — I10 Essential (primary) hypertension: Secondary | ICD-10-CM | POA: Diagnosis not present

## 2021-07-05 DIAGNOSIS — J309 Allergic rhinitis, unspecified: Secondary | ICD-10-CM | POA: Diagnosis not present

## 2021-07-05 DIAGNOSIS — Z23 Encounter for immunization: Secondary | ICD-10-CM | POA: Diagnosis not present

## 2021-07-05 DIAGNOSIS — R748 Abnormal levels of other serum enzymes: Secondary | ICD-10-CM | POA: Diagnosis not present

## 2021-07-05 DIAGNOSIS — R3129 Other microscopic hematuria: Secondary | ICD-10-CM | POA: Diagnosis not present

## 2021-07-05 DIAGNOSIS — E782 Mixed hyperlipidemia: Secondary | ICD-10-CM | POA: Diagnosis not present

## 2021-07-05 DIAGNOSIS — Z0000 Encounter for general adult medical examination without abnormal findings: Secondary | ICD-10-CM | POA: Diagnosis not present

## 2021-07-05 DIAGNOSIS — N6099 Unspecified benign mammary dysplasia of unspecified breast: Secondary | ICD-10-CM | POA: Diagnosis not present

## 2021-07-05 DIAGNOSIS — F418 Other specified anxiety disorders: Secondary | ICD-10-CM | POA: Diagnosis not present

## 2021-07-05 DIAGNOSIS — I471 Supraventricular tachycardia: Secondary | ICD-10-CM | POA: Diagnosis not present

## 2021-07-10 DIAGNOSIS — I10 Essential (primary) hypertension: Secondary | ICD-10-CM | POA: Diagnosis not present

## 2021-07-10 DIAGNOSIS — E782 Mixed hyperlipidemia: Secondary | ICD-10-CM | POA: Diagnosis not present

## 2021-07-10 DIAGNOSIS — R3129 Other microscopic hematuria: Secondary | ICD-10-CM | POA: Diagnosis not present

## 2021-07-24 DIAGNOSIS — L821 Other seborrheic keratosis: Secondary | ICD-10-CM | POA: Diagnosis not present

## 2021-07-24 DIAGNOSIS — L814 Other melanin hyperpigmentation: Secondary | ICD-10-CM | POA: Diagnosis not present

## 2021-07-24 DIAGNOSIS — Z872 Personal history of diseases of the skin and subcutaneous tissue: Secondary | ICD-10-CM | POA: Diagnosis not present

## 2021-07-24 DIAGNOSIS — L820 Inflamed seborrheic keratosis: Secondary | ICD-10-CM | POA: Diagnosis not present

## 2021-07-24 DIAGNOSIS — Z09 Encounter for follow-up examination after completed treatment for conditions other than malignant neoplasm: Secondary | ICD-10-CM | POA: Diagnosis not present

## 2021-07-24 DIAGNOSIS — L298 Other pruritus: Secondary | ICD-10-CM | POA: Diagnosis not present

## 2021-07-24 DIAGNOSIS — L570 Actinic keratosis: Secondary | ICD-10-CM | POA: Diagnosis not present

## 2021-08-08 ENCOUNTER — Other Ambulatory Visit: Payer: Self-pay | Admitting: Nurse Practitioner

## 2021-08-08 DIAGNOSIS — Z1231 Encounter for screening mammogram for malignant neoplasm of breast: Secondary | ICD-10-CM

## 2021-09-17 ENCOUNTER — Encounter: Payer: Self-pay | Admitting: Nurse Practitioner

## 2021-09-17 NOTE — Progress Notes (Unsigned)
Pamela Whitehead Bartha Jul 22, 1950 [id number]   History:  71 y.o. G1P0010 presents for breast and pelvic exam. No GYN complaints. Postmenopausal - on ERT. Has tried to wean and does not tolerate due to headaches and return of vasomotor symptoms. 01/2020 BSO for left cyst and prophylaxis, 1993 TVH for endometriosis. Using vaginal estrogen twice weekly for atrophic vaginitis. ASCUS negative HPV 2017/2018/2019, 2020 LGSIL with negative colposcopy, 2021 ASCUS negative HPV, 2022 VAIN 1. 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 HTN managed by PCP.  Gynecologic History No LMP recorded. Patient has had a hysterectomy.   Contraception: status post hysterectomy Sexually active: Yes  Health Maintenance Last Pap: 09/21/2020. Results were: LGSIL Last mammogram: 09/21/2020. Results were: Stable benign right breast mass Last colonoscopy: 09/17/2012. Results were: Normal, 10-year recall Last Dexa: 06/14/2017. Results were: Normal  Past medical history, past surgical history, family history and social history were all reviewed and documented in the EPIC chart. 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 Engaged.   ROS:  A ROS was performed and pertinent positives and negatives are included.  Exam:  Vitals:   09/18/21 0803  BP: 122/72  Pulse: 72  SpO2: 99%  Weight: 163 lb (73.9 kg)  Height: '5\' 9"'$  (1.753 m)    Body mass index is 24.07 kg/m.  General appearance:  Normal Thyroid:  Symmetrical, normal in size, without palpable masses or nodularity. Respiratory  Auscultation:  Clear without wheezing or rhonchi Cardiovascular  Auscultation:  Regular rate, without rubs, murmurs or gallops  Edema/varicosities:  Not grossly evident Abdominal  Soft,nontender, without masses, guarding or rebound.  Liver/spleen:  No organomegaly noted  Hernia:  None appreciated  Skin  Inspection:  Grossly normal Breasts: Examined lying and sitting.   Right: Without masses, retractions, nipple discharge or axillary  adenopathy.   Left: Without masses, retractions, nipple discharge or axillary adenopathy.  Inguinal/mons:  Normal without inguinal adenopathy  External genitalia:  Hypopigmentation from clitoral hood to perineum  BUS/Urethra/Skene's glands:  Normal  Vagina:  Normal appearing with normal color and discharge, no lesions. Atrophic changes  Cervix:  and uterus absent  Adnexa/parametria:     Rt: Normal in size, without masses or tenderness.   Lt: Normal in size, without masses or tenderness.  Anus and perineum: Normal  Digital rectal exam: Normal sphincter tone without palpated masses or tenderness  Patient informed chaperone available to be present for breast and pelvic exam. Patient has requested no chaperone to be present. Patient has been advised what will be completed during breast and pelvic exam.   Assessment/Plan:  71 y.o. G1P0010 for breast and pelvic exam.   Well female exam with routine gynecological exam - Education provided on SBEs, importance of preventative screenings, current guidelines, high calcium diet, regular exercise, and multivitamin daily.  Labs with PCP.   VAIN I (vaginal intraepithelial neoplasia grade I) - Plan: Cytology - PAP( [place]). Pap 09/2020 LGSIL, biopsy confirmed VAIN 1. Repeat vaginal pap today.   Postmenopausal hormone therapy - Plan: estradiol (ESTRACE) 1 MG tablet daily. She has tried to wean but does not tolerate due to headaches and return of vasomotor symptoms. She is aware of the risk for blood clots, stroke, heart attack, and breast cancer. She would like to continue. Refill x 1 year provided.  Acute vulvitis - Plan: clobetasol ointment (TEMOVATE) 0.05 % BID x 7-10 days. Recommend applying vaginal estrogen cream twice weekly after.   Postmenopausal atrophic vaginitis - Vaginal estrogen twice weekly. Does not need refill at this time.  Insomnia, unspecified type - Plan: zolpidem (AMBIEN) 5 MG tablet as needed for sleep. She uses intermittently  and usually takes 1/2 tablet. Refill provided.   Screening for breast cancer - Continue annual screenings. Stable right breast mass x 2 years. Normal breast exam today.  Screening for colon cancer - 2014 colonoscopy. Will repeat at GI's recommended interval.   Screening for osteoporosis - Normal DXA in 2019, will repeat at 5-year interval. Very active and works with trainer 3x/week.   Return in 1 year for breast and pelvic exam.     Tamela Gammon DNP, 8:10 AM 09/18/2021

## 2021-09-18 ENCOUNTER — Ambulatory Visit (INDEPENDENT_AMBULATORY_CARE_PROVIDER_SITE_OTHER): Payer: Medicare Other | Admitting: Nurse Practitioner

## 2021-09-18 ENCOUNTER — Encounter: Payer: Self-pay | Admitting: Nurse Practitioner

## 2021-09-18 ENCOUNTER — Other Ambulatory Visit (HOSPITAL_COMMUNITY)
Admission: RE | Admit: 2021-09-18 | Discharge: 2021-09-18 | Disposition: A | Discharge status: Home / Self Care | Payer: Medicare Other | Source: Ambulatory Visit | Attending: Nurse Practitioner | Admitting: Nurse Practitioner

## 2021-09-18 VITALS — BP 122/72 | HR 72 | Ht 69.0 in | Wt 163.0 lb

## 2021-09-18 DIAGNOSIS — N890 Mild vaginal dysplasia: Secondary | ICD-10-CM | POA: Insufficient documentation

## 2021-09-18 DIAGNOSIS — Z1272 Encounter for screening for malignant neoplasm of vagina: Secondary | ICD-10-CM

## 2021-09-18 DIAGNOSIS — N952 Postmenopausal atrophic vaginitis: Secondary | ICD-10-CM | POA: Diagnosis not present

## 2021-09-18 DIAGNOSIS — Z124 Encounter for screening for malignant neoplasm of cervix: Secondary | ICD-10-CM | POA: Diagnosis not present

## 2021-09-18 DIAGNOSIS — N762 Acute vulvitis: Secondary | ICD-10-CM

## 2021-09-18 DIAGNOSIS — Z01419 Encounter for gynecological examination (general) (routine) without abnormal findings: Secondary | ICD-10-CM

## 2021-09-18 DIAGNOSIS — Z779 Other contact with and (suspected) exposures hazardous to health: Secondary | ICD-10-CM | POA: Diagnosis not present

## 2021-09-18 DIAGNOSIS — Z9189 Other specified personal risk factors, not elsewhere classified: Secondary | ICD-10-CM

## 2021-09-18 DIAGNOSIS — Z90710 Acquired absence of both cervix and uterus: Secondary | ICD-10-CM | POA: Insufficient documentation

## 2021-09-18 DIAGNOSIS — Z1151 Encounter for screening for human papillomavirus (HPV): Secondary | ICD-10-CM | POA: Insufficient documentation

## 2021-09-18 DIAGNOSIS — G4700 Insomnia, unspecified: Secondary | ICD-10-CM | POA: Diagnosis not present

## 2021-09-18 DIAGNOSIS — Z79890 Hormone replacement therapy (postmenopausal): Secondary | ICD-10-CM

## 2021-09-18 DIAGNOSIS — R87622 Low grade squamous intraepithelial lesion on cytologic smear of vagina (LGSIL): Secondary | ICD-10-CM | POA: Insufficient documentation

## 2021-09-18 MED ORDER — ZOLPIDEM TARTRATE 5 MG PO TABS: 5.0000 mg | ORAL_TABLET | Freq: Every evening | ORAL | 5 refills | Status: DC | PRN

## 2021-09-18 MED ORDER — CLOBETASOL PROPIONATE 0.05 % EX OINT: 1.0000 | TOPICAL_OINTMENT | Freq: Two times a day (BID) | CUTANEOUS | 0 refills | Status: DC

## 2021-09-18 MED ORDER — ESTRADIOL 1 MG PO TABS: ORAL_TABLET | ORAL | 4 refills | Status: DC

## 2021-09-24 LAB — CYTOLOGY - PAP
Comment: NEGATIVE
High risk HPV: NEGATIVE

## 2021-09-25 ENCOUNTER — Ambulatory Visit
Admission: RE | Admit: 2021-09-25 | Discharge: 2021-09-25 | Disposition: A | Discharge status: Home / Self Care | Payer: Medicare Other | Source: Ambulatory Visit | Attending: Nurse Practitioner | Admitting: Nurse Practitioner

## 2021-09-25 DIAGNOSIS — Z1231 Encounter for screening mammogram for malignant neoplasm of breast: Secondary | ICD-10-CM

## 2021-09-27 ENCOUNTER — Other Ambulatory Visit: Payer: Self-pay | Admitting: Nurse Practitioner

## 2021-09-27 DIAGNOSIS — R928 Other abnormal and inconclusive findings on diagnostic imaging of breast: Secondary | ICD-10-CM

## 2021-10-02 ENCOUNTER — Ambulatory Visit: Payer: Self-pay | Admitting: Family Medicine

## 2021-10-02 VITALS — BP 132/75 | HR 69 | Temp 97.6°F | Resp 12 | Ht 70.0 in | Wt 163.0 lb

## 2021-10-02 DIAGNOSIS — M795 Residual foreign body in soft tissue: Secondary | ICD-10-CM

## 2021-10-02 DIAGNOSIS — L03012 Cellulitis of left finger: Secondary | ICD-10-CM

## 2021-10-02 MED ORDER — DOXYCYCLINE HYCLATE 100 MG PO TABS: 100.0000 mg | ORAL_TABLET | Freq: Two times a day (BID) | ORAL | 0 refills | Status: AC

## 2021-10-02 NOTE — Progress Notes (Unsigned)
   Acute Office Visit  Subjective:     Patient ID: Pamela Whitehead, female    DOB: [birth year], 71 y.o.   MRN: [id number]  Chief Complaint  Patient presents with   Hand Pain    C/O left first digit pain and swelling. Pt. States "she pulled something out of her finger"    HPI Patient is in today for ***  ROS      Objective:    BP 132/75   Pulse 69   Temp 97.6 F (36.4 C)   Resp 12   Ht '5\' 10"'$  (1.778 m)   Wt 163 lb (73.9 kg)   SpO2 96%   BMI 23.39 kg/m  {Vitals History (Optional):23777}  Physical Exam  No results found for any visits on 10/02/21.      Assessment & Plan:   Problem List Items Addressed This Visit   None Visit Diagnoses     Foreign body (FB) in soft tissue    -  Primary   Relevant Orders   DG Finger Index Left   Cellulitis of finger of left hand       Relevant Medications   doxycycline (VIBRA-TABS) 100 MG tablet   Other Relevant Orders   DG Finger Index Left       Meds ordered this encounter  Medications   doxycycline (VIBRA-TABS) 100 MG tablet    Sig: Take 1 tablet (100 mg total) by mouth 2 (two) times daily for 10 days.    Dispense:  20 tablet    Refill:  0    No follow-ups on file.  Elwin Mocha, MD

## 2021-10-04 ENCOUNTER — Other Ambulatory Visit: Payer: Self-pay | Admitting: Nurse Practitioner

## 2021-10-04 ENCOUNTER — Ambulatory Visit
Admission: RE | Admit: 2021-10-04 | Discharge: 2021-10-04 | Disposition: A | Discharge status: Home / Self Care | Payer: Medicare Other | Source: Ambulatory Visit | Attending: Nurse Practitioner | Admitting: Nurse Practitioner

## 2021-10-04 DIAGNOSIS — N6321 Unspecified lump in the left breast, upper outer quadrant: Secondary | ICD-10-CM | POA: Diagnosis not present

## 2021-10-04 DIAGNOSIS — R928 Other abnormal and inconclusive findings on diagnostic imaging of breast: Secondary | ICD-10-CM

## 2021-10-04 DIAGNOSIS — N6320 Unspecified lump in the left breast, unspecified quadrant: Secondary | ICD-10-CM

## 2021-10-04 DIAGNOSIS — R922 Inconclusive mammogram: Secondary | ICD-10-CM | POA: Diagnosis not present

## 2021-10-14 ENCOUNTER — Ambulatory Visit
Admission: RE | Admit: 2021-10-14 | Discharge: 2021-10-14 | Disposition: A | Discharge status: Home / Self Care | Payer: Medicare Other | Source: Ambulatory Visit | Attending: Nurse Practitioner | Admitting: Nurse Practitioner

## 2021-10-14 ENCOUNTER — Other Ambulatory Visit: Payer: Self-pay | Admitting: Diagnostic Radiology

## 2021-10-14 DIAGNOSIS — N6012 Diffuse cystic mastopathy of left breast: Secondary | ICD-10-CM | POA: Diagnosis not present

## 2021-10-14 DIAGNOSIS — N6321 Unspecified lump in the left breast, upper outer quadrant: Secondary | ICD-10-CM | POA: Diagnosis not present

## 2021-10-14 DIAGNOSIS — N6320 Unspecified lump in the left breast, unspecified quadrant: Secondary | ICD-10-CM

## 2021-10-14 HISTORY — PX: BREAST BIOPSY: SHX20

## 2021-11-20 IMAGING — US US BREAST*R* LIMITED INC AXILLA
1 series · 5 of 5 positions shown · non-contrast
Comparison: Previous exam(s).

CLINICAL DATA: 69-year-old female presenting for 1 year follow-up
of a probably benign right breast mass.

EXAM:
DIGITAL DIAGNOSTIC BILATERAL MAMMOGRAM WITH CAD AND TOMO
ULTRASOUND RIGHT BREAST

[Series 1: us breast*right* limited inc axilla · 0.06mm/px · 5 of 5 slices shown]
[im 1/5]
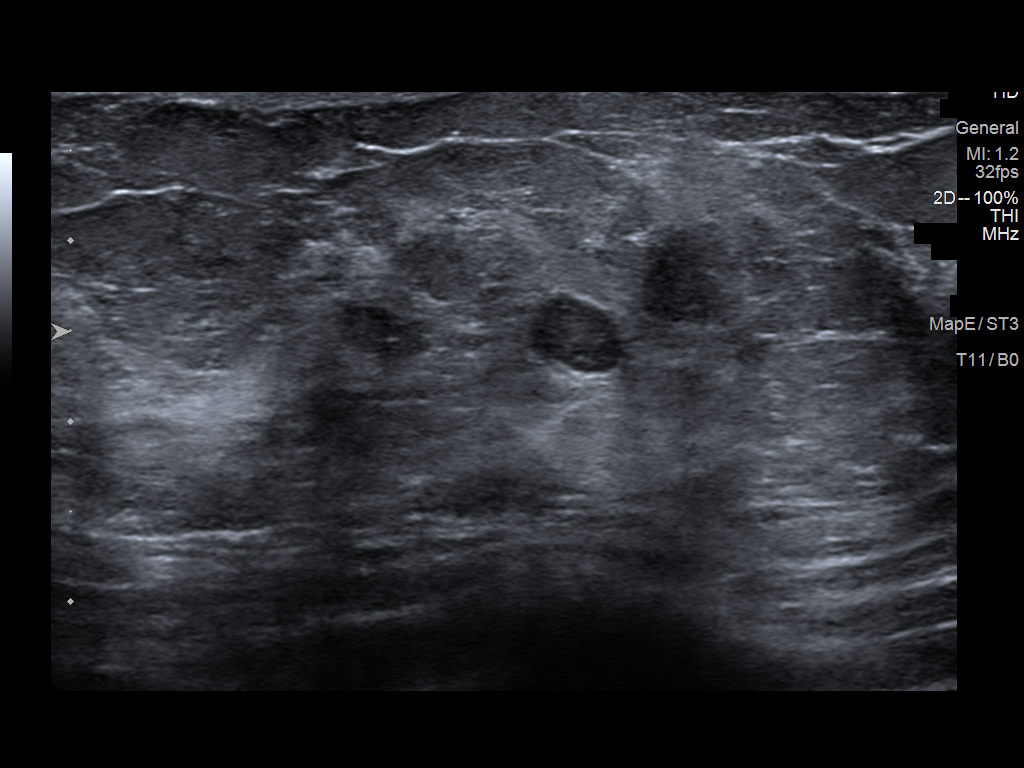
[im 2/5]
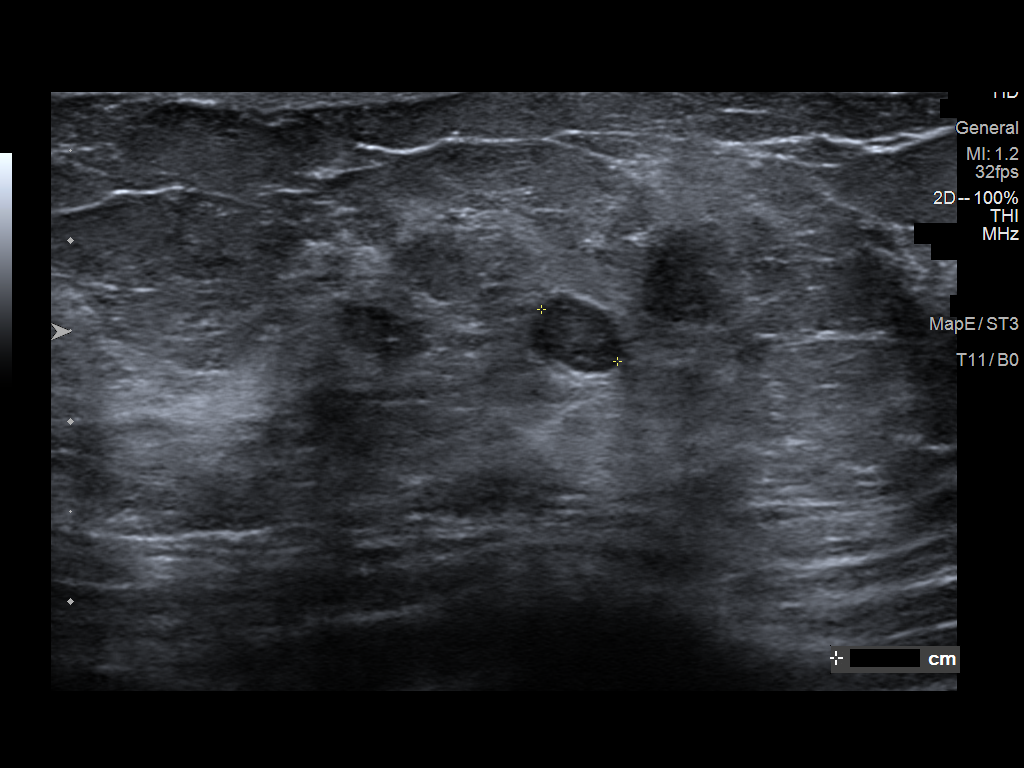
[im 3/5]
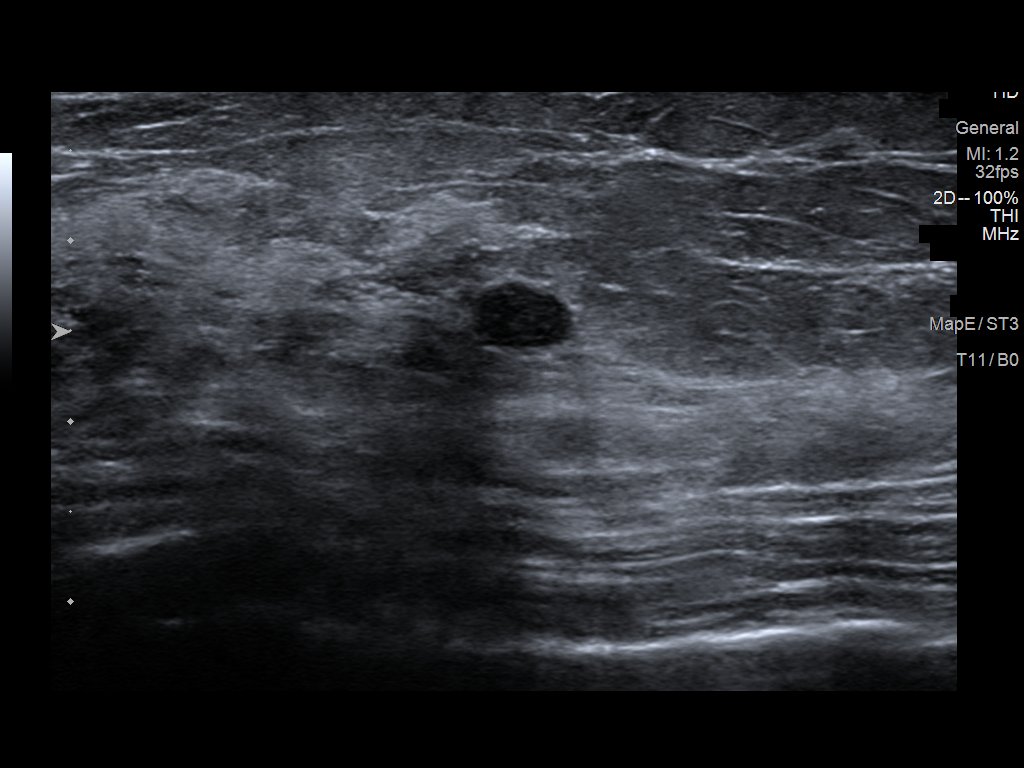
[im 4/5]
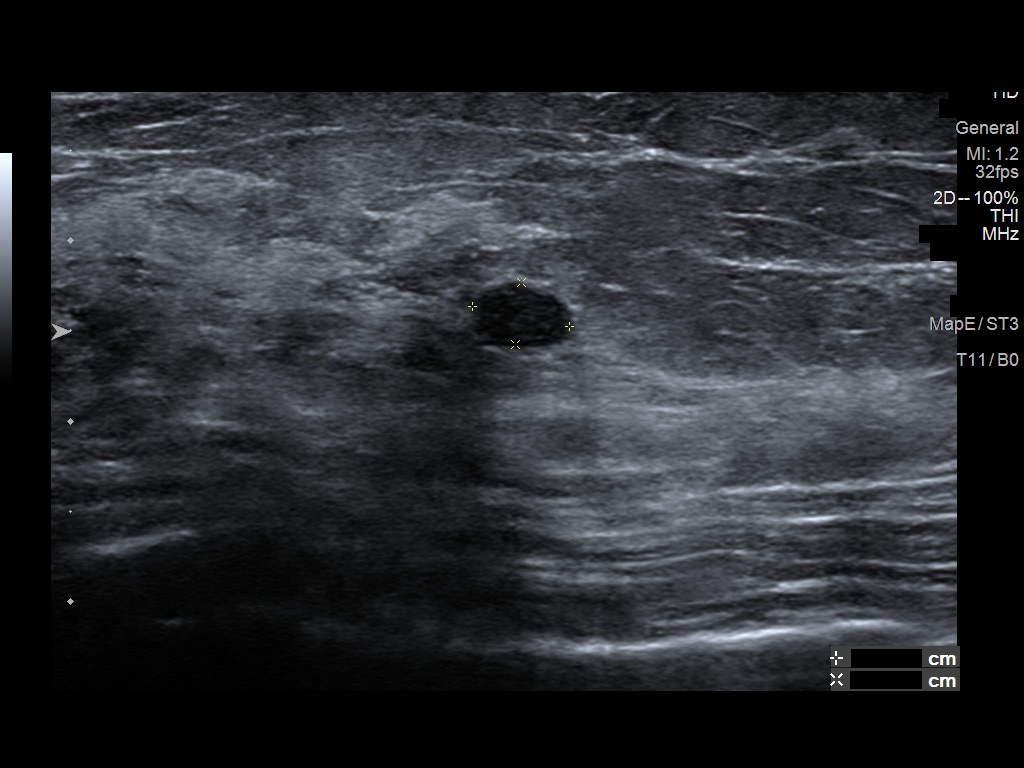
[im 5/5]
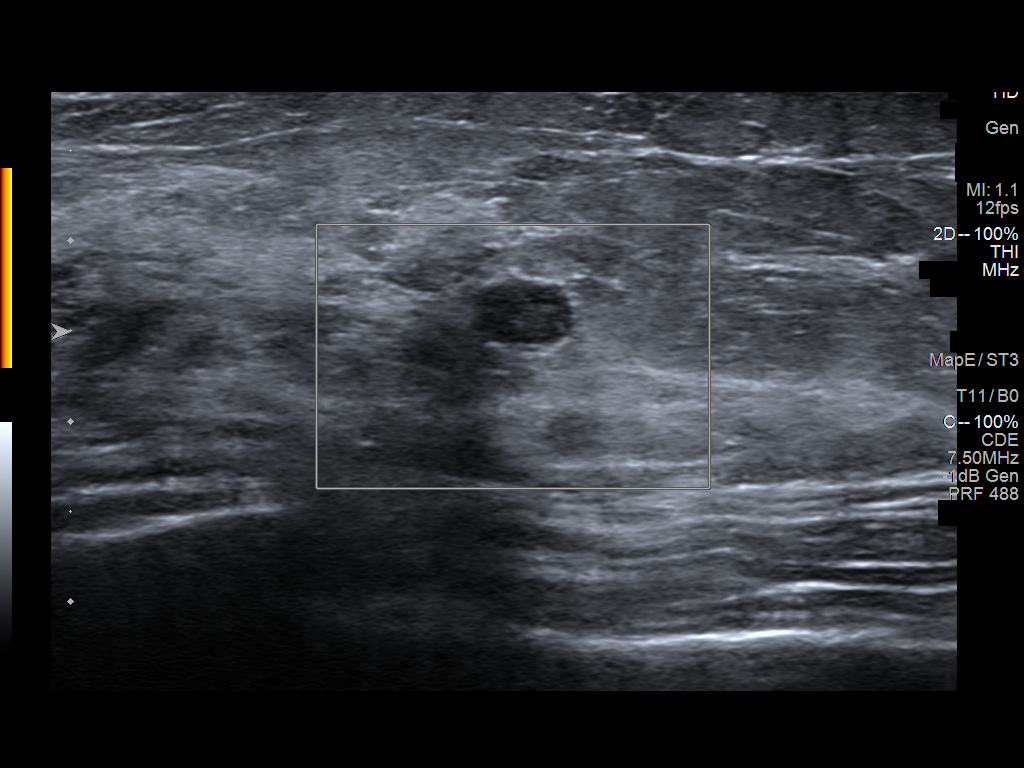

[5 of 5 positions shown; findings below may reference images not displayed]

ACR Breast Density Category c: The breast tissue is heterogeneously
dense, which may obscure small masses.
FINDINGS: Stable circumscribed equal density masses are noted in the lower
outer right breast. No new or suspicious findings are identified in
either breast. The parenchymal pattern is stable.

Mammographic images were processed with CAD.

Targeted ultrasound is performed, showing stable appearance of an
oval, circumscribed hypoechoic mass at the 6 o'clock position 1 cm
from the nipple. It measures 6 x 5 x 3 mm (previously 6 x 6 x 4 mm).
IMPRESSION: 1. Stable, probably benign right breast mass. Recommendation is for
a final ultrasound follow-up in 1 year to coincide with the
patient's annual bilateral mammogram.
2. No mammographic evidence of malignancy on the left.

RECOMMENDATION:
Bilateral diagnostic mammogram and right breast ultrasound in 1
year.

I have discussed the findings and recommendations with the patient.
If applicable, a reminder letter will be sent to the patient
regarding the next appointment.

BI-RADS CATEGORY  3: Probably benign.

## 2021-11-20 IMAGING — MG DIGITAL DIAGNOSTIC BILAT W/ TOMO W/ CAD
6 of 10 series · 6 of 30 positions shown · non-contrast
Comparison: Previous exam(s).

CLINICAL DATA: 69-year-old female presenting for 1 year follow-up
of a probably benign right breast mass.

EXAM:
DIGITAL DIAGNOSTIC BILATERAL MAMMOGRAM WITH CAD AND TOMO
ULTRASOUND RIGHT BREAST

[R MLO synth-2D (1 of 2)]
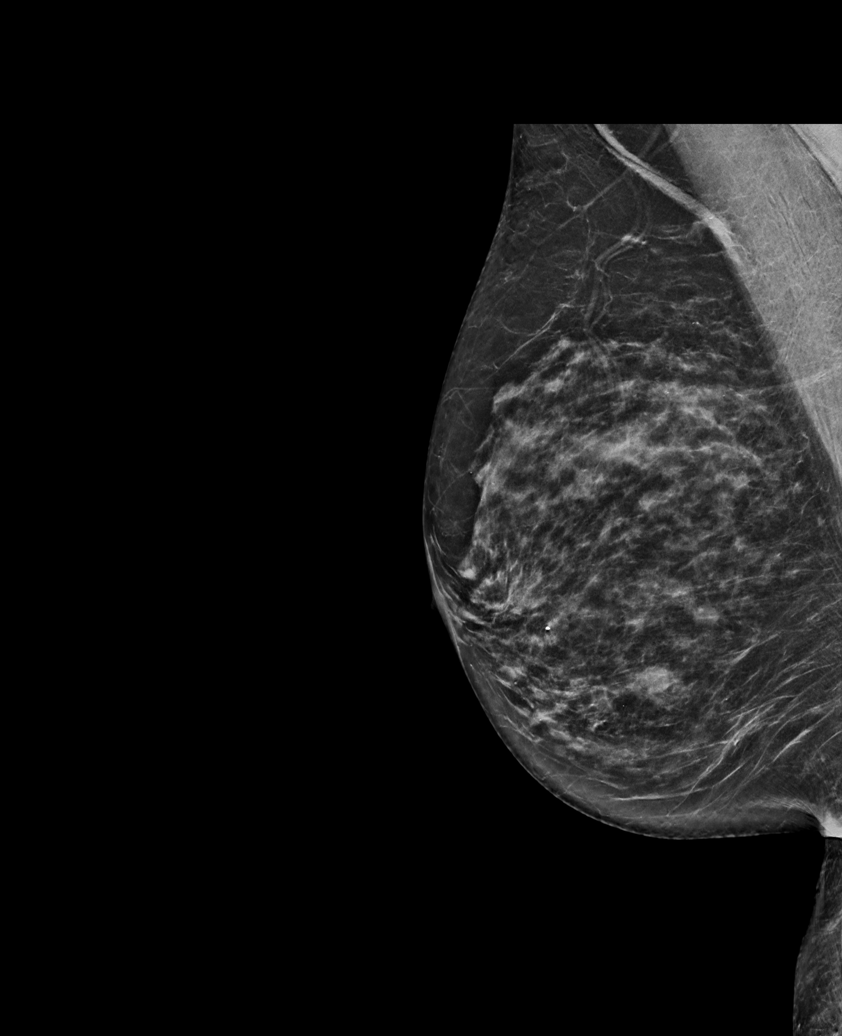

[R CC synth-2D]
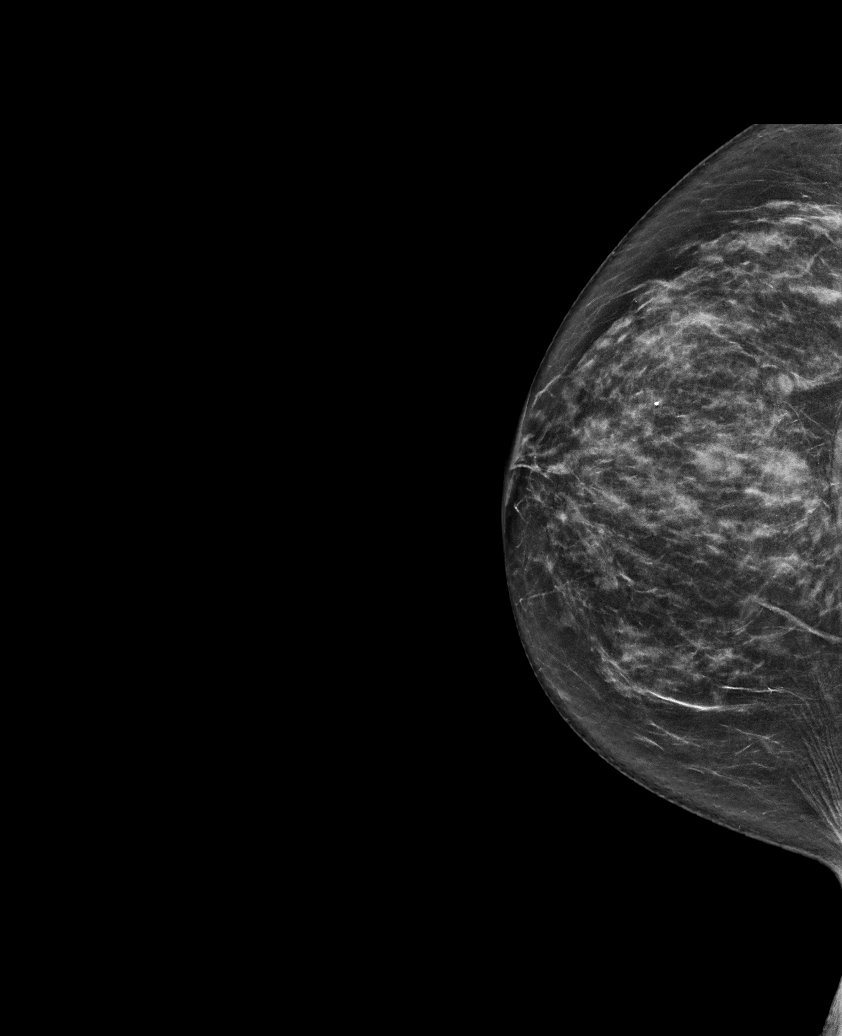

[R MLO synth-2D (2 of 2)]
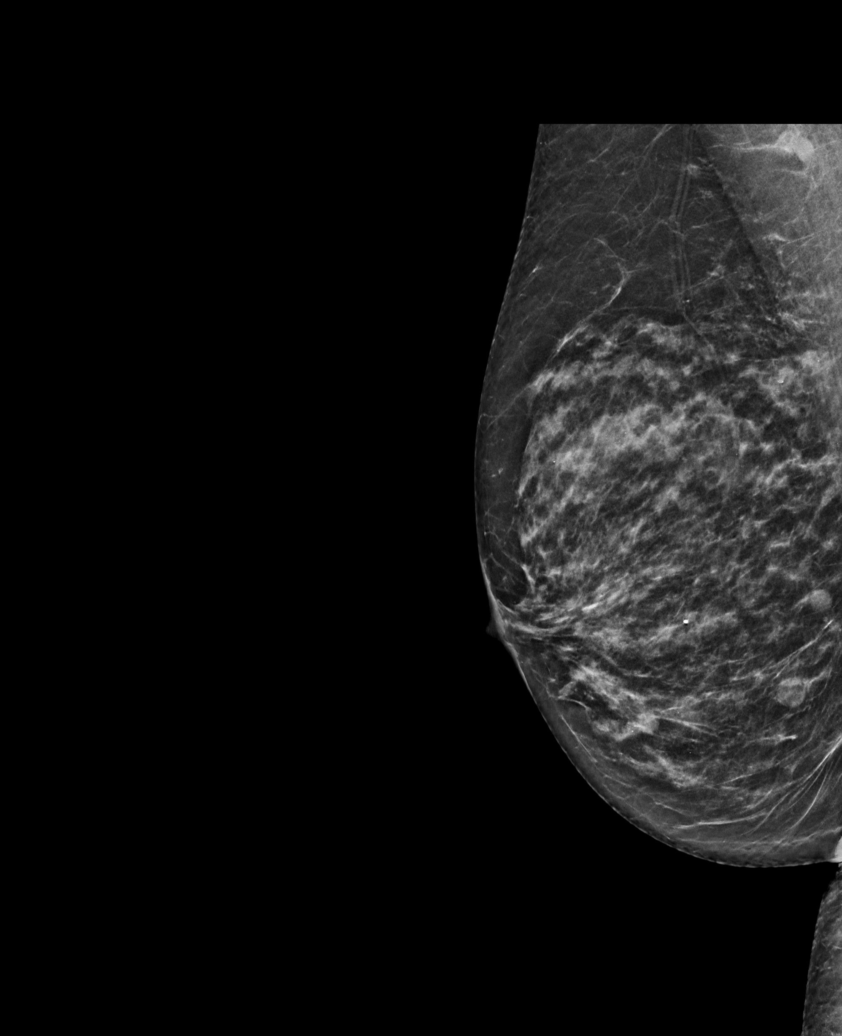

[L CC synth-2D]
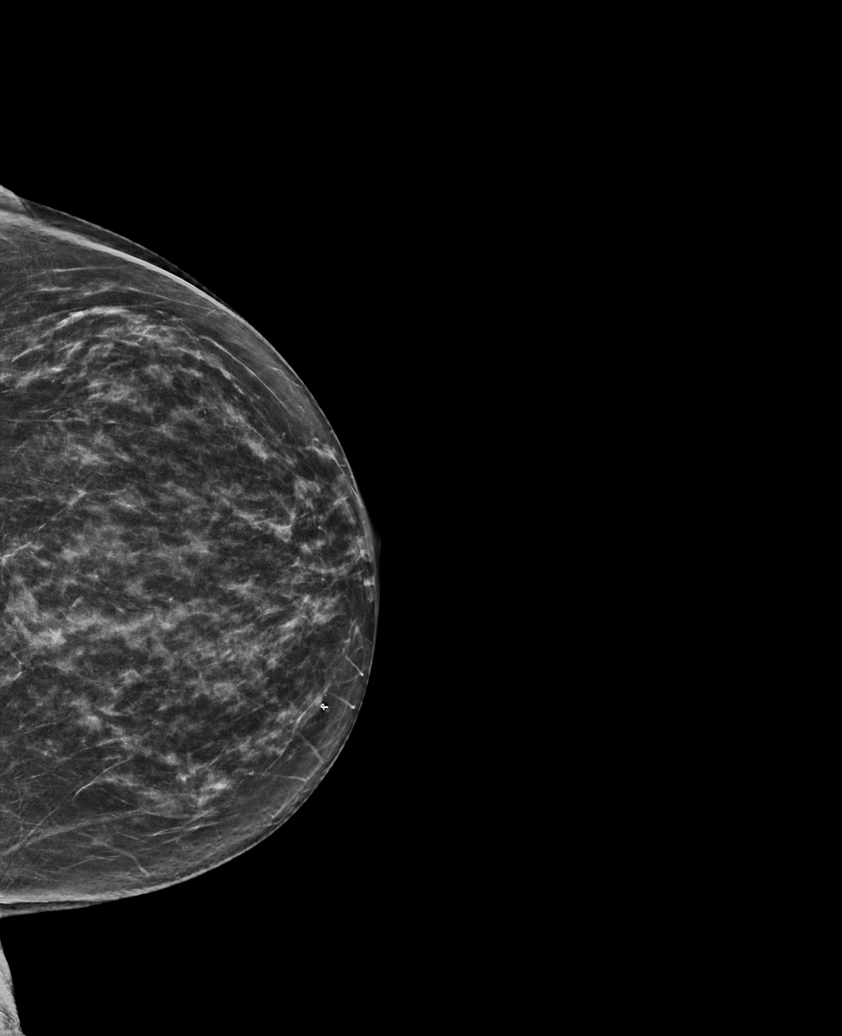

[L MLO synth-2D]
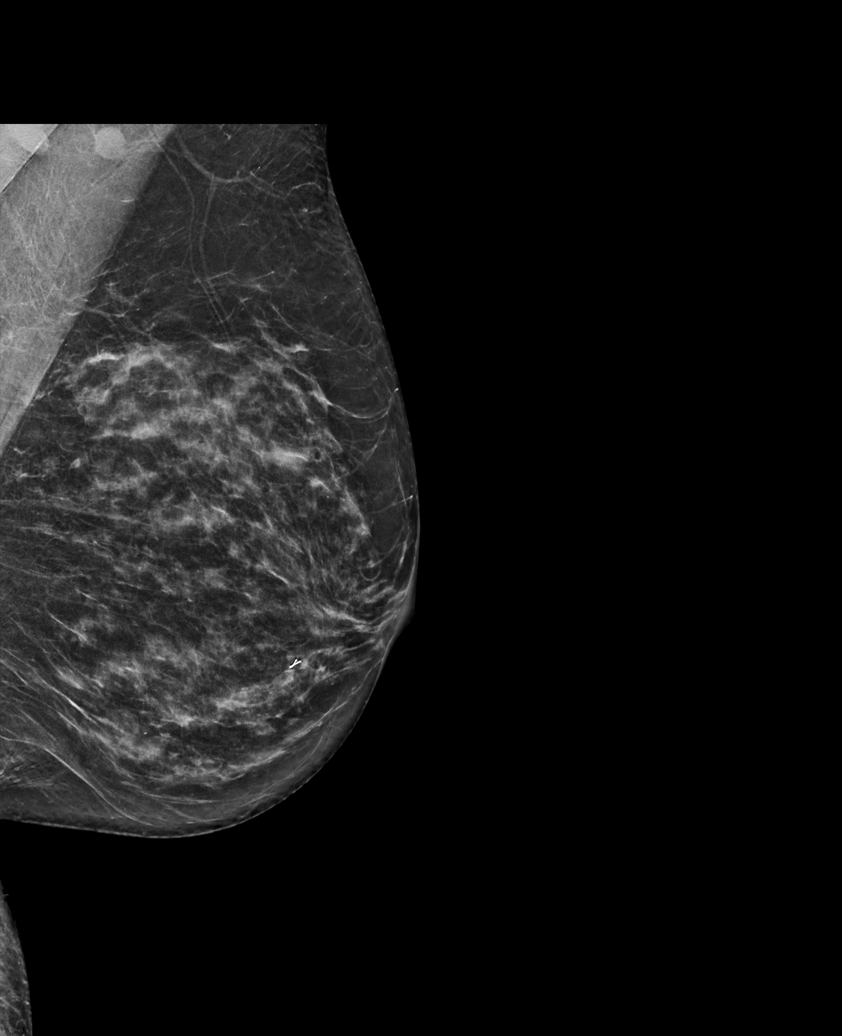

[L CC tomo · tomo slice 33/64.0]
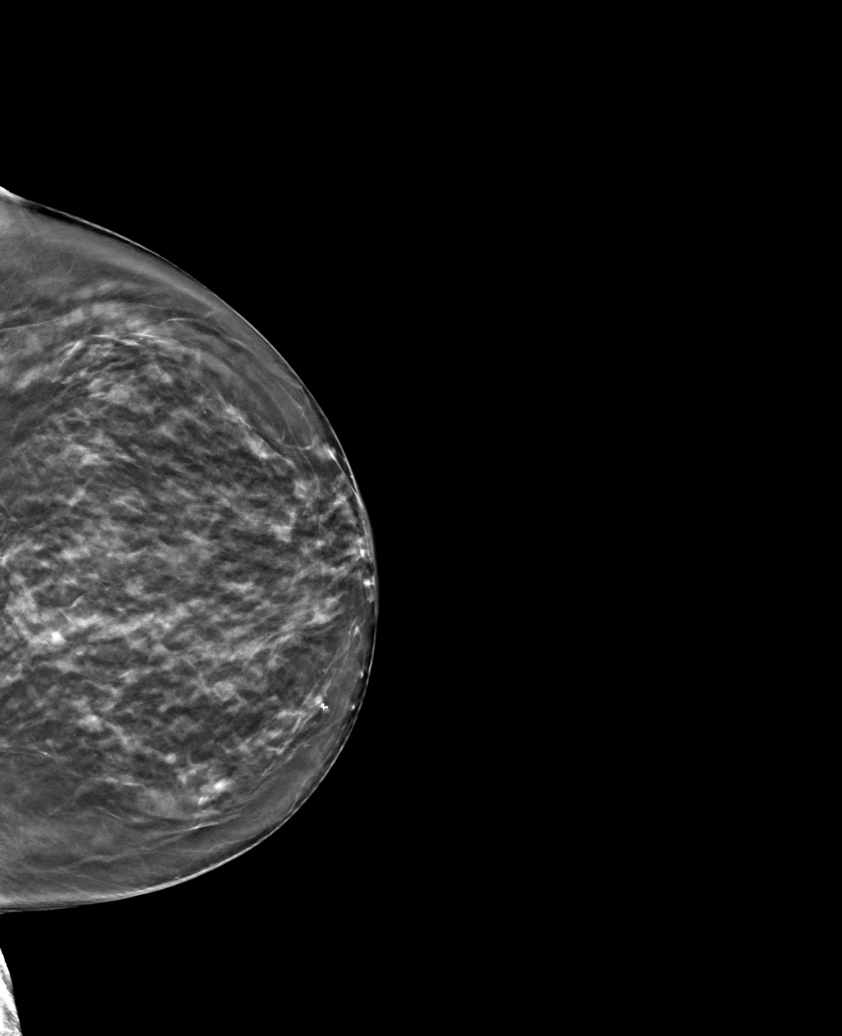

[6 of 30 positions shown; findings below may reference images not displayed]

ACR Breast Density Category c: The breast tissue is heterogeneously
dense, which may obscure small masses.
FINDINGS: Stable circumscribed equal density masses are noted in the lower
outer right breast. No new or suspicious findings are identified in
either breast. The parenchymal pattern is stable.

Mammographic images were processed with CAD.

Targeted ultrasound is performed, showing stable appearance of an
oval, circumscribed hypoechoic mass at the 6 o'clock position 1 cm
from the nipple. It measures 6 x 5 x 3 mm (previously 6 x 6 x 4 mm).
IMPRESSION: 1. Stable, probably benign right breast mass. Recommendation is for
a final ultrasound follow-up in 1 year to coincide with the
patient's annual bilateral mammogram.
2. No mammographic evidence of malignancy on the left.

RECOMMENDATION:
Bilateral diagnostic mammogram and right breast ultrasound in 1
year.

I have discussed the findings and recommendations with the patient.
If applicable, a reminder letter will be sent to the patient
regarding the next appointment.

BI-RADS CATEGORY  3: Probably benign.

## 2021-11-22 DIAGNOSIS — Z23 Encounter for immunization: Secondary | ICD-10-CM | POA: Diagnosis not present

## 2021-12-05 DIAGNOSIS — Z23 Encounter for immunization: Secondary | ICD-10-CM | POA: Diagnosis not present

## 2021-12-19 DIAGNOSIS — H2513 Age-related nuclear cataract, bilateral: Secondary | ICD-10-CM | POA: Diagnosis not present

## 2021-12-27 DIAGNOSIS — J301 Allergic rhinitis due to pollen: Secondary | ICD-10-CM | POA: Diagnosis not present

## 2021-12-27 DIAGNOSIS — E782 Mixed hyperlipidemia: Secondary | ICD-10-CM | POA: Diagnosis not present

## 2021-12-27 DIAGNOSIS — I10 Essential (primary) hypertension: Secondary | ICD-10-CM | POA: Diagnosis not present

## 2022-02-10 DIAGNOSIS — J029 Acute pharyngitis, unspecified: Secondary | ICD-10-CM | POA: Diagnosis not present

## 2022-02-10 DIAGNOSIS — M1990 Unspecified osteoarthritis, unspecified site: Secondary | ICD-10-CM | POA: Diagnosis not present

## 2022-03-06 ENCOUNTER — Other Ambulatory Visit: Payer: Self-pay | Admitting: Nurse Practitioner

## 2022-03-06 DIAGNOSIS — G4700 Insomnia, unspecified: Secondary | ICD-10-CM

## 2022-03-12 DIAGNOSIS — R799 Abnormal finding of blood chemistry, unspecified: Secondary | ICD-10-CM | POA: Diagnosis not present

## 2022-03-14 ENCOUNTER — Other Ambulatory Visit: Payer: Self-pay | Admitting: Nurse Practitioner

## 2022-03-14 DIAGNOSIS — Z1231 Encounter for screening mammogram for malignant neoplasm of breast: Secondary | ICD-10-CM

## 2022-03-14 DIAGNOSIS — R928 Other abnormal and inconclusive findings on diagnostic imaging of breast: Secondary | ICD-10-CM

## 2022-03-18 ENCOUNTER — Other Ambulatory Visit: Payer: Self-pay

## 2022-03-18 ENCOUNTER — Telehealth: Payer: Self-pay

## 2022-03-18 DIAGNOSIS — G4700 Insomnia, unspecified: Secondary | ICD-10-CM

## 2022-03-18 MED ORDER — ZOLPIDEM TARTRATE 5 MG PO TABS: 5.0000 mg | ORAL_TABLET | Freq: Every evening | ORAL | 5 refills | Status: DC | PRN

## 2022-03-18 NOTE — Telephone Encounter (Signed)
Spoke with patient and informed her that Rx for Ambien refills sent.  No refills needed on estradiol as she still has three 90 day refills left.

## 2022-03-18 NOTE — Telephone Encounter (Signed)
Patient called stating her AEX is not due until July but she is needing refills before then.  She said she will need 6 refills on her Ambien.   She said she will need 3 refills on her Estradiol tabs but I spoke with pharmacy and confirmed she still have 270 tabs left for refill so she has plenty. I will let her know.

## 2022-03-18 NOTE — Telephone Encounter (Signed)
Opened in error

## 2022-03-21 DIAGNOSIS — T7840XA Allergy, unspecified, initial encounter: Secondary | ICD-10-CM | POA: Diagnosis not present

## 2022-04-08 DIAGNOSIS — M21961 Unspecified acquired deformity of right lower leg: Secondary | ICD-10-CM | POA: Diagnosis not present

## 2022-04-08 DIAGNOSIS — L565 Disseminated superficial actinic porokeratosis (DSAP): Secondary | ICD-10-CM | POA: Diagnosis not present

## 2022-04-08 DIAGNOSIS — D2371 Other benign neoplasm of skin of right lower limb, including hip: Secondary | ICD-10-CM | POA: Diagnosis not present

## 2022-04-08 DIAGNOSIS — M792 Neuralgia and neuritis, unspecified: Secondary | ICD-10-CM | POA: Diagnosis not present

## 2022-04-23 DIAGNOSIS — D225 Melanocytic nevi of trunk: Secondary | ICD-10-CM | POA: Diagnosis not present

## 2022-04-23 DIAGNOSIS — Z86007 Personal history of in-situ neoplasm of skin: Secondary | ICD-10-CM | POA: Diagnosis not present

## 2022-04-23 DIAGNOSIS — L538 Other specified erythematous conditions: Secondary | ICD-10-CM | POA: Diagnosis not present

## 2022-04-23 DIAGNOSIS — L820 Inflamed seborrheic keratosis: Secondary | ICD-10-CM | POA: Diagnosis not present

## 2022-04-23 DIAGNOSIS — L298 Other pruritus: Secondary | ICD-10-CM | POA: Diagnosis not present

## 2022-04-23 DIAGNOSIS — Z08 Encounter for follow-up examination after completed treatment for malignant neoplasm: Secondary | ICD-10-CM | POA: Diagnosis not present

## 2022-04-23 DIAGNOSIS — L821 Other seborrheic keratosis: Secondary | ICD-10-CM | POA: Diagnosis not present

## 2022-04-23 DIAGNOSIS — L814 Other melanin hyperpigmentation: Secondary | ICD-10-CM | POA: Diagnosis not present

## 2022-04-29 DIAGNOSIS — J029 Acute pharyngitis, unspecified: Secondary | ICD-10-CM | POA: Diagnosis not present

## 2022-05-05 ENCOUNTER — Ambulatory Visit: Admission: RE | Admit: 2022-05-05 | Payer: Medicare Other | Source: Ambulatory Visit

## 2022-05-05 ENCOUNTER — Ambulatory Visit
Admission: RE | Admit: 2022-05-05 | Discharge: 2022-05-05 | Disposition: A | Discharge status: Home / Self Care | Payer: Medicare Other | Source: Ambulatory Visit | Attending: Nurse Practitioner | Admitting: Nurse Practitioner

## 2022-05-05 DIAGNOSIS — R928 Other abnormal and inconclusive findings on diagnostic imaging of breast: Secondary | ICD-10-CM

## 2022-06-23 DIAGNOSIS — Z889 Allergy status to unspecified drugs, medicaments and biological substances status: Secondary | ICD-10-CM | POA: Diagnosis not present

## 2022-09-23 ENCOUNTER — Encounter: Payer: Self-pay | Admitting: Nurse Practitioner

## 2022-09-23 ENCOUNTER — Other Ambulatory Visit (HOSPITAL_COMMUNITY)
Admission: RE | Admit: 2022-09-23 | Discharge: 2022-09-23 | Disposition: A | Discharge status: Home / Self Care | Payer: Medicare Other | Source: Ambulatory Visit | Attending: Nurse Practitioner | Admitting: Nurse Practitioner

## 2022-09-23 ENCOUNTER — Ambulatory Visit (INDEPENDENT_AMBULATORY_CARE_PROVIDER_SITE_OTHER): Payer: Medicare Other | Admitting: Nurse Practitioner

## 2022-09-23 VITALS — BP 128/64 | HR 72 | Ht 68.5 in | Wt 153.0 lb

## 2022-09-23 DIAGNOSIS — Z1272 Encounter for screening for malignant neoplasm of vagina: Secondary | ICD-10-CM | POA: Diagnosis not present

## 2022-09-23 DIAGNOSIS — Z9189 Other specified personal risk factors, not elsewhere classified: Secondary | ICD-10-CM | POA: Diagnosis not present

## 2022-09-23 DIAGNOSIS — L900 Lichen sclerosus et atrophicus: Secondary | ICD-10-CM | POA: Diagnosis not present

## 2022-09-23 DIAGNOSIS — Z79890 Hormone replacement therapy (postmenopausal): Secondary | ICD-10-CM

## 2022-09-23 DIAGNOSIS — G4700 Insomnia, unspecified: Secondary | ICD-10-CM | POA: Diagnosis not present

## 2022-09-23 DIAGNOSIS — N890 Mild vaginal dysplasia: Secondary | ICD-10-CM

## 2022-09-23 DIAGNOSIS — Z1151 Encounter for screening for human papillomavirus (HPV): Secondary | ICD-10-CM | POA: Insufficient documentation

## 2022-09-23 DIAGNOSIS — N952 Postmenopausal atrophic vaginitis: Secondary | ICD-10-CM

## 2022-09-23 DIAGNOSIS — F419 Anxiety disorder, unspecified: Secondary | ICD-10-CM | POA: Diagnosis not present

## 2022-09-23 DIAGNOSIS — Z01419 Encounter for gynecological examination (general) (routine) without abnormal findings: Secondary | ICD-10-CM

## 2022-09-23 DIAGNOSIS — Z780 Asymptomatic menopausal state: Secondary | ICD-10-CM

## 2022-09-23 MED ORDER — ESTRADIOL 0.1 MG/GM VA CREA: 0.5000 g | TOPICAL_CREAM | VAGINAL | 2 refills | Status: DC

## 2022-09-23 MED ORDER — ALPRAZOLAM 0.5 MG PO TABS: 0.5000 mg | ORAL_TABLET | Freq: Every day | ORAL | 0 refills | Status: AC | PRN

## 2022-09-23 MED ORDER — ESTRADIOL 1 MG PO TABS: ORAL_TABLET | ORAL | 4 refills | Status: DC

## 2022-09-23 MED ORDER — CLOBETASOL PROPIONATE 0.05 % EX OINT: 1.0000 | TOPICAL_OINTMENT | CUTANEOUS | 2 refills | Status: AC

## 2022-09-23 MED ORDER — ZOLPIDEM TARTRATE 5 MG PO TABS: 5.0000 mg | ORAL_TABLET | Freq: Every evening | ORAL | 5 refills | Status: DC | PRN

## 2022-09-23 NOTE — Progress Notes (Signed)
Pamela Whitehead September 02, 1950 [id number]   History:  72 y.o. G1P0010 presents for breast and pelvic exam. No GYN complaints. Postmenopausal - on ERT. Has tried to wean and does not tolerate due to headaches and return of vasomotor symptoms. 01/2020 BSO for left cyst and prophylaxis, 1993 TVH for endometriosis. Using vaginal estrogen twice weekly for atrophic vaginitis. Complains of intermittent external itching. ASCUS negative HPV 2017/2018/2019, 2020 LGSIL with negative colposcopy, 2021 ASCUS negative HPV, 2022 VAIN 1, 2023 LGSIL neg HPV. 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 Plans to establish with new PCP. Requesting refill for xanax in the meantime.   Gynecologic History No LMP recorded. Patient has had a hysterectomy.   Contraception: status post hysterectomy Sexually active: No  Health Maintenance Last Pap: 09/18/2021. Results were: LGSIL neg HPV Last mammogram: 05/05/2022. Results were: Normal Last colonoscopy: 09/17/2012. Results were: Normal, 10-year recall Last Dexa: 06/14/2017. Results were: Normal  Past medical history, past surgical history, family history and social history were all reviewed and documented in the EPIC chart. Works in Clinical research associate.   ROS:  A ROS was performed and pertinent positives and negatives are included.  Exam:  Vitals:   09/23/22 0849  BP: 128/64  Pulse: 72  SpO2: 100%  Weight: 153 lb (69.4 kg)  Height: 5' 8.5" (1.74 m)     Body mass index is 22.92 kg/m.  General appearance:  Normal Thyroid:  Symmetrical, normal in size, without palpable masses or nodularity. Respiratory  Auscultation:  Clear without wheezing or rhonchi Cardiovascular  Auscultation:  Regular rate, without rubs, murmurs or gallops  Edema/varicosities:  Not grossly evident Abdominal  Soft,nontender, without masses, guarding or rebound.  Liver/spleen:  No organomegaly noted  Hernia:  None appreciated  Skin  Inspection:  Grossly normal Breasts: Examined lying and  sitting.   Right: Without masses, retractions, nipple discharge or axillary adenopathy.   Left: Without masses, retractions, nipple discharge or axillary adenopathy.  Inguinal/mons:  Normal without inguinal adenopathy  External genitalia:  Hypopigmentation/pearly appearance from clitoral hood to perineum  BUS/Urethra/Skene's glands:  Normal  Vagina:  Normal appearing with normal color and discharge, no lesions. Atrophic changes  Cervix:  and uterus absent  Adnexa/parametria:     Rt: Normal in size, without masses or tenderness.   Lt: Normal in size, without masses or tenderness.  Anus and perineum: Normal  Digital rectal exam: Not indicated  Patient informed chaperone available to be present for breast and pelvic exam. Patient has requested no chaperone to be present. Patient has been advised what will be completed during breast and pelvic exam.   Assessment/Plan:  72 y.o. G1P0010 for breast and pelvic exam.   Encounter for breast and pelvic examination - Education provided on SBEs, importance of preventative screenings, current guidelines, high calcium diet, regular exercise, and multivitamin daily.  Labs with PCP. Plans to establish with new PCP soon.   Postmenopausal hormone therapy - Plan: estradiol (ESTRACE) 1 MG tablet daily. She has tried to wean but does not tolerate due to headaches and return of vasomotor symptoms. She is aware of the risk for blood clots, stroke, heart attack, and breast cancer. She would like to continue. Refill x 1 year provided.  VAIN I (vaginal intraepithelial neoplasia grade I) - Plan: Cytology - PAP( [place]). See HPI for pap history. Vaginal pap today.   Vaginal atrophy - Plan: estradiol (ESTRACE VAGINAL) 0.1 MG/GM vaginal cream 1/2 gram 3 times weekly. Good management.   DES exposure in utero -  Plan: Cytology - PAP( [place]). See HPI for pap history. Annual paps recommended.   Postmenopausal - Plan: DG Bone Density. ERT. S/P TVH BSO.   Lichen  sclerosus - Plan: clobetasol ointment (TEMOVATE) 0.05 % twice weekly. Initial does - BID x 1 week, then daily x 1 week, then twice weekly. Educated on lichen sclerosus and management.   Anxiety - Plan: ALPRAZolam (XANAX) 0.5 MG tablet daily as needed. #30 provided. Will continue with PCP once established. Aware will not continue refills through our office.   Insomnia, unspecified type - Plan: zolpidem (AMBIEN) 5 MG tablet as needed for sleep. She uses intermittently and usually takes 1/2 tablet. Refill provided.   Screening for breast cancer - Continue annual screenings. Stable right breast mass x 2 years. Normal breast exam today.  Screening for colon cancer - 2014 colonoscopy. Due now and plans to schedule soon.   Screening for osteoporosis - Normal bone density in 2019. Recall placed to schedule in the fall once we are doing DXAs again.   Return in 1 year for breast and pelvic exam or sooner if needed.      Olivia Mackie DNP, 9:31 AM 09/23/2022

## 2022-09-25 LAB — CYTOLOGY - PAP
Comment: NEGATIVE
High risk HPV: NEGATIVE

## 2022-10-09 DIAGNOSIS — E7800 Pure hypercholesterolemia, unspecified: Secondary | ICD-10-CM | POA: Diagnosis not present

## 2022-10-09 DIAGNOSIS — R002 Palpitations: Secondary | ICD-10-CM | POA: Diagnosis not present

## 2022-10-09 DIAGNOSIS — G8929 Other chronic pain: Secondary | ICD-10-CM | POA: Diagnosis not present

## 2022-10-09 DIAGNOSIS — F419 Anxiety disorder, unspecified: Secondary | ICD-10-CM | POA: Diagnosis not present

## 2022-10-09 DIAGNOSIS — Z8711 Personal history of peptic ulcer disease: Secondary | ICD-10-CM | POA: Diagnosis not present

## 2022-10-09 DIAGNOSIS — J302 Other seasonal allergic rhinitis: Secondary | ICD-10-CM | POA: Diagnosis not present

## 2022-10-09 DIAGNOSIS — M545 Low back pain, unspecified: Secondary | ICD-10-CM | POA: Diagnosis not present

## 2022-10-21 ENCOUNTER — Telehealth: Payer: Self-pay

## 2022-10-21 NOTE — Telephone Encounter (Signed)
Pt LVM in triage line stating received call from pharmacy that no refills on rxs for estradiol and ambien. Pt reports recent AEX and that usually TW sends in refills for one year so voices confusion as to why receiving call.   Spoke w/ pt and she confirmed that it was an automated system that called her. Pt notified that if it is an automated system or through the app and they're trying to get refills on an old rx, then it will say that there are no refills left and will need to contact provider.   However, notified that new rx was sent in for all prescriptions. Spoke w/ pharmacy and they were able to get her cream and zolpidem ready for her but it was too soon for her estradiol tablet. Pt voiced understanding and appreciation for call.   Routing to provider for final review and closing.

## 2022-11-07 DIAGNOSIS — Z23 Encounter for immunization: Secondary | ICD-10-CM | POA: Diagnosis not present

## 2022-11-13 ENCOUNTER — Telehealth: Payer: Self-pay | Admitting: *Deleted

## 2022-11-13 NOTE — Telephone Encounter (Signed)
Spoke with patient. Patient reports red, swollen labia and clitoris. Reports itching, urinary frequency and urgency. Burning when urine touches the skin. Has been using clobetasol as previously prescribed, symptoms have not improved. Applying coconut oil and Vaseline for comfort. Denies flank pain, fever/chills, vaginal d/c, odor or bleeding. Asking for Rx.   Advised OV recommended for further evaluation, patient agreeable, declines OV today. Scheduled for 9/6 at 0945 with JC. Advised I will send update to providers and return call if any additional recommendations. Patient agreeable.   Routing to provider for final review. Patient is agreeable to disposition. Will close encounter.  Cc: Clearnce Hasten, NP

## 2022-11-14 ENCOUNTER — Ambulatory Visit (INDEPENDENT_AMBULATORY_CARE_PROVIDER_SITE_OTHER): Payer: Medicare Other | Admitting: Radiology

## 2022-11-14 ENCOUNTER — Other Ambulatory Visit: Payer: Self-pay

## 2022-11-14 VITALS — BP 112/76

## 2022-11-14 DIAGNOSIS — R300 Dysuria: Secondary | ICD-10-CM | POA: Diagnosis not present

## 2022-11-14 DIAGNOSIS — N762 Acute vulvitis: Secondary | ICD-10-CM | POA: Diagnosis not present

## 2022-11-14 LAB — WET PREP FOR TRICH, YEAST, CLUE

## 2022-11-14 MED ORDER — NYSTATIN-TRIAMCINOLONE 100000-0.1 UNIT/GM-% EX OINT: 1.0000 | TOPICAL_OINTMENT | Freq: Two times a day (BID) | CUTANEOUS | 0 refills | Status: AC

## 2022-11-14 NOTE — Progress Notes (Signed)
      Subjective: Pamela Whitehead is a 72 y.o. female who complains of dysuria, trouble emptying bladder (no urgency or frequency), vaginal burning, itching, labial redness and swelling. Symptoms began over the weekend. Hx of Lichen sclerosus, clobetasol offered no improvement.    Review of Systems  All other systems reviewed and are negative.   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   Physical Exam Constitutional:      Appearance: She is normal weight.  Pulmonary:     Effort: Pulmonary effort is normal.  Genitourinary:    Labia:        Right: Rash and tenderness present.        Left: Rash and tenderness present.      Vagina: No vaginal discharge, erythema, tenderness, bleeding or lesions.       Comments: Erythema and swelling bilateral labia and clitoral hood Neurological:     Mental Status: She is alert.      Urine dipstick shows positive for RBC's, positive for protein, and positive for urobilinogen.  Micro exam: 0-5 WBC's per HPF, 3-10 RBC's per HPF, many+ bacteria, and hyaline casts seen.  Microscopic wet-mount exam shows negative for pathogens, normal epithelial cells.   Raynelle Fanning, CMA present for  exam  Assessment:/Plan:   1. Dysuria - Urinalysis,Complete w/RFL Culture  2. Acute vulvitis - nystatin-triamcinolone ointment (MYCOLOG); Apply 1 Application topically 2 (two) times daily.  Dispense: 30 g; Refill: 0 - WET PREP FOR TRICH, YEAST, CLUE    Will contact patient with results of testing completed today. Avoid intercourse until symptoms are resolved. Safe sex encouraged. Avoid the use of soaps or perfumed products in the peri area. Avoid tub baths and sitting in sweaty or wet clothing for prolonged periods of time.

## 2022-11-14 NOTE — Telephone Encounter (Signed)
Patient called about urine results. She is concerned because there was bacteria in her urine. She wants to know if she needs antibiotic.

## 2022-11-17 LAB — URINALYSIS, COMPLETE W/RFL CULTURE
Glucose, UA: NEGATIVE
Leukocyte Esterase: NEGATIVE
Nitrites, Initial: NEGATIVE
Specific Gravity, Urine: 1.025 (ref 1.001–1.035)
pH: 5.5 (ref 5.0–8.0)

## 2022-11-17 LAB — URINE CULTURE
MICRO NUMBER:: 15431746
SPECIMEN QUALITY:: ADEQUATE

## 2022-11-17 LAB — CULTURE INDICATED

## 2022-11-17 NOTE — Telephone Encounter (Signed)
Spoke with patient. Patient expressed frustration results not called and no treatment provided to date. Patient is requesting results for OV UA/Culture on 11/14/22. States symptoms have not resolved. Denies any new symptoms. Mycology ointment prescribed not improving external itching/burning. Patient states kidney stone present in urine on 9/6.   Explained urine culture takes 3 days for results, resulted at 0945 today. Advised results go to patient same time as provider, results then reviewed and called to patient. Advised Jami os out of the office on Mondays, will send to Tiffany to review and we will f/u with recommendations. Patient agreeable.   Tiffany -please review.

## 2022-11-17 NOTE — Telephone Encounter (Signed)
Negative urine culture, no evidence of kidney stone either. Negative wet prep at visit but yeast could be external only and not present on wet prep. Recommend treating with Diflucan to see if this helps. Please confirm she is using cream BID as prescribed.

## 2022-11-17 NOTE — Telephone Encounter (Signed)
Patient has called and left message requesting status of Rx. States she will need Rx for burning and itching.  Routing update to covering provider.   Cc: Clearnce Hasten, NP

## 2022-11-17 NOTE — Telephone Encounter (Signed)
Spoke with patient, advised per TW. Patient states she is using cream BID as prescribed. Patient request diflucan RX to pharmacy on file.   Patient frustrated that kidney stone not documented in OV note. Advised that does not mean kidney stone was not present when urine sample provided. Advised we do not treat kidney stones with abx. Patient states she was seen in office 9/6 and not called with results on 9/6 and treated. Advised again that urinalysis results reviewed during OV, urine culture takes minimum of 3 days to complete.   Patient is requesting Diflucan RX and return call from [place] on 9/10.   Rx pended for diflucan, routing to TW.   Cc: Clearnce Hasten, NP

## 2022-11-18 MED ORDER — FLUCONAZOLE 150 MG PO TABS: 150.0000 mg | ORAL_TABLET | ORAL | 0 refills | Status: DC

## 2022-11-18 NOTE — Telephone Encounter (Signed)
Left message to return our call at 0932.

## 2022-11-18 NOTE — Telephone Encounter (Signed)
Call returned to patient to notify Rx sent to pharmacy 9/10 at 0746. Patient is requesting return call from Jami to discuss kidney stones. Advised message forwarded to provider. Advised JC is seeing patients, I can not guarantee return call time. Patient verbalizes understanding.

## 2022-12-05 DIAGNOSIS — Z23 Encounter for immunization: Secondary | ICD-10-CM | POA: Diagnosis not present

## 2022-12-11 ENCOUNTER — Telehealth: Payer: Self-pay | Admitting: *Deleted

## 2022-12-11 MED ORDER — FLUCONAZOLE 150 MG PO TABS: 150.0000 mg | ORAL_TABLET | ORAL | 0 refills | Status: AC

## 2022-12-11 NOTE — Telephone Encounter (Signed)
Ok to send fluconazole 150mg  po #1 take one then repeat in 3 days

## 2022-12-11 NOTE — Telephone Encounter (Signed)
Patient notified.  Advised if symptoms do not resole or new symptoms develop, OV needed for further evaluation. Patient verbalizes understanding and is agreeable.   Encounter closed.

## 2022-12-11 NOTE — Telephone Encounter (Signed)
Patient left message requesting refill of diflucan for itching and burning.   Spoke with patient. Patient reports external vulvar itching and burning for 2-3 days. Has tried Mycolog ointment with little improvement. States the diflucan sent previously resolved symptoms. Requesting refill.   Asvised OV recommended for further evaluation, patient declines at this time, request provider review for Rx. Patient states she also never received call from [facility] regarding kidney stones. Advised per review of telephone encounter dated 11/14/22, provider called and left message on 11/18/22 at 0932. Patient states message not received.   Advised I will forward request and f/u with recommendations. Patient agreeable.

## 2022-12-31 DIAGNOSIS — N1832 Chronic kidney disease, stage 3b: Secondary | ICD-10-CM | POA: Diagnosis not present

## 2022-12-31 DIAGNOSIS — R002 Palpitations: Secondary | ICD-10-CM | POA: Diagnosis not present

## 2022-12-31 DIAGNOSIS — E782 Mixed hyperlipidemia: Secondary | ICD-10-CM | POA: Diagnosis not present

## 2022-12-31 DIAGNOSIS — I10 Essential (primary) hypertension: Secondary | ICD-10-CM | POA: Diagnosis not present

## 2022-12-31 DIAGNOSIS — F40243 Fear of flying: Secondary | ICD-10-CM | POA: Diagnosis not present

## 2022-12-31 DIAGNOSIS — J302 Other seasonal allergic rhinitis: Secondary | ICD-10-CM | POA: Diagnosis not present

## 2023-01-09 DIAGNOSIS — M545 Low back pain, unspecified: Secondary | ICD-10-CM | POA: Diagnosis not present

## 2023-01-09 DIAGNOSIS — M546 Pain in thoracic spine: Secondary | ICD-10-CM | POA: Diagnosis not present

## 2023-02-03 DIAGNOSIS — G8929 Other chronic pain: Secondary | ICD-10-CM | POA: Diagnosis not present

## 2023-02-03 DIAGNOSIS — E782 Mixed hyperlipidemia: Secondary | ICD-10-CM | POA: Diagnosis not present

## 2023-02-13 DIAGNOSIS — M546 Pain in thoracic spine: Secondary | ICD-10-CM | POA: Diagnosis not present

## 2023-03-20 DIAGNOSIS — Z20822 Contact with and (suspected) exposure to covid-19: Secondary | ICD-10-CM | POA: Diagnosis not present

## 2023-03-20 DIAGNOSIS — J321 Chronic frontal sinusitis: Secondary | ICD-10-CM | POA: Diagnosis not present

## 2023-03-20 DIAGNOSIS — Z6822 Body mass index (BMI) 22.0-22.9, adult: Secondary | ICD-10-CM | POA: Diagnosis not present

## 2023-03-20 DIAGNOSIS — R053 Chronic cough: Secondary | ICD-10-CM | POA: Diagnosis not present

## 2023-03-20 DIAGNOSIS — J320 Chronic maxillary sinusitis: Secondary | ICD-10-CM | POA: Diagnosis not present

## 2023-03-20 DIAGNOSIS — Z03818 Encounter for observation for suspected exposure to other biological agents ruled out: Secondary | ICD-10-CM | POA: Diagnosis not present

## 2023-03-20 DIAGNOSIS — R0981 Nasal congestion: Secondary | ICD-10-CM | POA: Diagnosis not present

## 2023-03-20 DIAGNOSIS — R5383 Other fatigue: Secondary | ICD-10-CM | POA: Diagnosis not present

## 2023-04-24 DIAGNOSIS — L821 Other seborrheic keratosis: Secondary | ICD-10-CM | POA: Diagnosis not present

## 2023-04-24 DIAGNOSIS — L820 Inflamed seborrheic keratosis: Secondary | ICD-10-CM | POA: Diagnosis not present

## 2023-04-24 DIAGNOSIS — L814 Other melanin hyperpigmentation: Secondary | ICD-10-CM | POA: Diagnosis not present

## 2023-04-24 DIAGNOSIS — Z86007 Personal history of in-situ neoplasm of skin: Secondary | ICD-10-CM | POA: Diagnosis not present

## 2023-04-24 DIAGNOSIS — D225 Melanocytic nevi of trunk: Secondary | ICD-10-CM | POA: Diagnosis not present

## 2023-04-24 DIAGNOSIS — L538 Other specified erythematous conditions: Secondary | ICD-10-CM | POA: Diagnosis not present

## 2023-04-24 DIAGNOSIS — Z08 Encounter for follow-up examination after completed treatment for malignant neoplasm: Secondary | ICD-10-CM | POA: Diagnosis not present

## 2023-05-22 DIAGNOSIS — I10 Essential (primary) hypertension: Secondary | ICD-10-CM | POA: Diagnosis not present

## 2023-05-22 DIAGNOSIS — G8929 Other chronic pain: Secondary | ICD-10-CM | POA: Diagnosis not present

## 2023-05-22 DIAGNOSIS — J302 Other seasonal allergic rhinitis: Secondary | ICD-10-CM | POA: Diagnosis not present

## 2023-05-22 DIAGNOSIS — E782 Mixed hyperlipidemia: Secondary | ICD-10-CM | POA: Diagnosis not present

## 2023-06-26 DIAGNOSIS — M791 Myalgia, unspecified site: Secondary | ICD-10-CM | POA: Diagnosis not present

## 2023-06-26 DIAGNOSIS — M545 Low back pain, unspecified: Secondary | ICD-10-CM | POA: Diagnosis not present

## 2023-06-26 DIAGNOSIS — M79642 Pain in left hand: Secondary | ICD-10-CM | POA: Diagnosis not present

## 2023-07-08 DIAGNOSIS — Z0000 Encounter for general adult medical examination without abnormal findings: Secondary | ICD-10-CM | POA: Diagnosis not present

## 2023-07-08 DIAGNOSIS — N1832 Chronic kidney disease, stage 3b: Secondary | ICD-10-CM | POA: Diagnosis not present

## 2023-07-08 DIAGNOSIS — J302 Other seasonal allergic rhinitis: Secondary | ICD-10-CM | POA: Diagnosis not present

## 2023-07-08 DIAGNOSIS — I10 Essential (primary) hypertension: Secondary | ICD-10-CM | POA: Diagnosis not present

## 2023-07-08 DIAGNOSIS — K219 Gastro-esophageal reflux disease without esophagitis: Secondary | ICD-10-CM | POA: Diagnosis not present

## 2023-07-08 DIAGNOSIS — J309 Allergic rhinitis, unspecified: Secondary | ICD-10-CM | POA: Diagnosis not present

## 2023-07-08 DIAGNOSIS — R002 Palpitations: Secondary | ICD-10-CM | POA: Diagnosis not present

## 2023-07-08 DIAGNOSIS — Z6821 Body mass index (BMI) 21.0-21.9, adult: Secondary | ICD-10-CM | POA: Diagnosis not present

## 2023-07-08 DIAGNOSIS — G8929 Other chronic pain: Secondary | ICD-10-CM | POA: Diagnosis not present

## 2023-07-08 DIAGNOSIS — E782 Mixed hyperlipidemia: Secondary | ICD-10-CM | POA: Diagnosis not present

## 2023-07-08 DIAGNOSIS — E559 Vitamin D deficiency, unspecified: Secondary | ICD-10-CM | POA: Diagnosis not present

## 2023-07-27 ENCOUNTER — Other Ambulatory Visit: Payer: Self-pay | Admitting: Nurse Practitioner

## 2023-07-27 ENCOUNTER — Other Ambulatory Visit: Payer: Self-pay

## 2023-07-27 DIAGNOSIS — G4700 Insomnia, unspecified: Secondary | ICD-10-CM

## 2023-07-27 DIAGNOSIS — N760 Acute vaginitis: Secondary | ICD-10-CM

## 2023-07-27 MED ORDER — ZOLPIDEM TARTRATE 5 MG PO TABS: 5.0000 mg | ORAL_TABLET | Freq: Every evening | ORAL | 1 refills | Status: DC | PRN

## 2023-07-27 MED ORDER — FLUCONAZOLE 150 MG PO TABS: 150.0000 mg | ORAL_TABLET | Freq: Once | ORAL | 0 refills | Status: AC

## 2023-07-27 NOTE — Telephone Encounter (Signed)
 Call placed to patient, left message advising both Rx were sent. Return call to office if any additional questions.   Encounter closed.

## 2023-07-27 NOTE — Telephone Encounter (Signed)
 Spoke with patient.   Med refill request:Ambien  5 mg tab PRN for sleep Last AEX: 09/23/22 -TW Next AEX: 10/27/23 -TW Last MMG (if hormonal med) Refill authorized: Please Advise?  Pharmacy confirmed.   Patient is also requesting Rx for Diflucan . Patient reports Hx of LS, external itching is intermittent. Denies vaginal d/c or odor. Using clobetasol  with no relief. States diflucan  helps with the flare ups when used with clobetasol .   Advised I will forward to Tiffany to review and our office will f/u. Patient agreeable.   Routing to Tiffany to review and advise.

## 2023-07-27 NOTE — Telephone Encounter (Signed)
 Patient called & left a message on triage line stating that Tiffany, NP writes her rx for ambien . She said her insurance only approves it to be shipped every 35 days. She states she missed her last shipment period & needs a refill. She also request for diflucan  pills to be sent for her. Per pt her pharmacy is [facility] [address].

## 2023-08-28 DIAGNOSIS — M545 Low back pain, unspecified: Secondary | ICD-10-CM | POA: Diagnosis not present

## 2023-08-28 DIAGNOSIS — M791 Myalgia, unspecified site: Secondary | ICD-10-CM | POA: Diagnosis not present

## 2023-10-27 ENCOUNTER — Encounter: Payer: Self-pay | Admitting: Nurse Practitioner

## 2023-10-27 ENCOUNTER — Other Ambulatory Visit (HOSPITAL_COMMUNITY)
Admission: RE | Admit: 2023-10-27 | Discharge: 2023-10-27 | Disposition: A | Discharge status: Home / Self Care | Source: Ambulatory Visit | Attending: Nurse Practitioner | Admitting: Nurse Practitioner

## 2023-10-27 ENCOUNTER — Encounter: Admitting: Nurse Practitioner

## 2023-10-27 VITALS — BP 128/70 | HR 66 | Ht 67.0 in | Wt 145.0 lb

## 2023-10-27 DIAGNOSIS — B3731 Acute candidiasis of vulva and vagina: Secondary | ICD-10-CM

## 2023-10-27 DIAGNOSIS — G4700 Insomnia, unspecified: Secondary | ICD-10-CM

## 2023-10-27 DIAGNOSIS — Z1151 Encounter for screening for human papillomavirus (HPV): Secondary | ICD-10-CM | POA: Diagnosis not present

## 2023-10-27 DIAGNOSIS — Z780 Asymptomatic menopausal state: Secondary | ICD-10-CM

## 2023-10-27 DIAGNOSIS — Z01419 Encounter for gynecological examination (general) (routine) without abnormal findings: Secondary | ICD-10-CM

## 2023-10-27 DIAGNOSIS — Z01411 Encounter for gynecological examination (general) (routine) with abnormal findings: Secondary | ICD-10-CM | POA: Diagnosis not present

## 2023-10-27 DIAGNOSIS — Z79890 Hormone replacement therapy (postmenopausal): Secondary | ICD-10-CM

## 2023-10-27 DIAGNOSIS — Z9189 Other specified personal risk factors, not elsewhere classified: Secondary | ICD-10-CM | POA: Insufficient documentation

## 2023-10-27 DIAGNOSIS — Z124 Encounter for screening for malignant neoplasm of cervix: Secondary | ICD-10-CM | POA: Insufficient documentation

## 2023-10-27 DIAGNOSIS — R87622 Low grade squamous intraepithelial lesion on cytologic smear of vagina (LGSIL): Secondary | ICD-10-CM | POA: Insufficient documentation

## 2023-10-27 DIAGNOSIS — R3915 Urgency of urination: Secondary | ICD-10-CM | POA: Diagnosis not present

## 2023-10-27 DIAGNOSIS — Z13820 Encounter for screening for osteoporosis: Secondary | ICD-10-CM

## 2023-10-27 DIAGNOSIS — N952 Postmenopausal atrophic vaginitis: Secondary | ICD-10-CM

## 2023-10-27 LAB — WET PREP FOR TRICH, YEAST, CLUE

## 2023-10-27 MED ORDER — FLUCONAZOLE 150 MG PO TABS: 150.0000 mg | ORAL_TABLET | ORAL | 0 refills | Status: DC

## 2023-10-27 MED ORDER — ESTRADIOL 0.1 MG/GM VA CREA: 1.0000 g | TOPICAL_CREAM | VAGINAL | 2 refills | Status: AC

## 2023-10-27 MED ORDER — ESTRADIOL 1 MG PO TABS: 1.0000 mg | ORAL_TABLET | Freq: Every day | ORAL | 4 refills | Status: AC

## 2023-10-27 MED ORDER — ZOLPIDEM TARTRATE 5 MG PO TABS: 5.0000 mg | ORAL_TABLET | Freq: Every evening | ORAL | 1 refills | Status: DC | PRN

## 2023-10-27 NOTE — Progress Notes (Signed)
 Pamela Whitehead [birth year] [id number]   History:  73 y.o. G1P0010 presents for breast and pelvic exam. Postmenopausal - on ERT. Has tried to wean and does not tolerate due to headaches and return of vasomotor symptoms. Complains of vulvar burning and redness. Prescribed vaginal estrogen last year for atrophic vaginitis but did not start. Having some urinary urgency. 01/2020 BSO for left cyst and prophylaxis, 1993 TVH for endometriosis. Needs refill on Ambien . See pap history below. 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   ASCUS negative HPV 2017/2018/2019 2020 LGSIL with negative colposcopy 2021 ASCUS negative HPV 2022 VAIN 1 2023 LGSIL neg HPV 09/2022 LGSIL neg HPV  Gynecologic History No LMP recorded. Patient has had a hysterectomy.   Contraception: status post hysterectomy Sexually active: No  Health Maintenance Last Pap: 09/23/2022. Results were: LGSIL neg HPV Last mammogram: 05/05/2022. Results were: Normal Last colonoscopy: 09/17/2012. Results were: Normal, 10-year recall Last Dexa: 06/14/2017. Results were: Normal  Past medical history, past surgical history, family history and social history were all reviewed and documented in the EPIC chart. Works in [employer] research [employer].   ROS:  A ROS was performed and pertinent positives and negatives are included.  Exam:  Vitals:   10/27/23 1556  BP: 128/70  Pulse: 66  SpO2: 99%  Weight: 145 lb (65.8 kg)  Height: 5' 7 (1.702 m)      Body mass index is 22.71 kg/m.  General appearance:  Normal Thyroid :  Symmetrical, normal in size, without palpable masses or nodularity. Respiratory  Auscultation:  Clear without wheezing or rhonchi Cardiovascular  Auscultation:  Regular rate, without rubs, murmurs or gallops  Edema/varicosities:  Not grossly evident Abdominal  Soft,nontender, without masses, guarding or rebound.  Liver/spleen:  No organomegaly noted  Hernia:  None appreciated  Skin  Inspection:  Grossly normal Breasts:  Examined lying and sitting.   Right: Without masses, retractions, nipple discharge or axillary adenopathy.   Left: Without masses, retractions, nipple discharge or axillary adenopathy.  Inguinal/mons:  Normal without inguinal adenopathy  External genitalia:  Hypopigmentation/pearly appearance from clitoral hood to perineum. Red, raised rash on vulva c/w yeast  BUS/Urethra/Skene's glands:  Normal  Vagina:  Normal appearing with normal color and discharge, no lesions. Atrophic changes  Cervix:  and uterus absent  Adnexa/parametria:     Rt: Normal in size, without masses or tenderness.   Lt: Normal in size, without masses or tenderness.  Anus and perineum: Normal  Digital rectal exam: Not indicated  Dereck Keas, CMA present as chaperone.   UA negative Wet prep + yeast  Assessment/Plan:  73 y.o. G1P0010 for breast and pelvic exam.   Encounter for breast and pelvic examination - Education provided on SBEs, importance of preventative screenings, current guidelines, high calcium diet, regular exercise, and multivitamin daily.  Labs with PCP.  Postmenopausal hormone therapy - Plan: estradiol  (ESTRACE ) 1 MG tablet daily. She has tried to wean but does not tolerate due to headaches and return of vasomotor symptoms. She is aware of the risk for blood clots, stroke, heart attack, and breast cancer. She would like to continue. Refill x 1 year provided.  DES exposure in utero - Plan: Cytology - PAP( [facility]). Annual paps.   Pap smear of vagina with LGSIL - Plan: Cytology - PAP( [facility]). See HPI for pap history. Pap today per guidelines.   Vaginal atrophy - Plan: estradiol  (ESTRACE  VAGINAL) 0.1 MG/GM vaginal cream 1 gram twice weekly. Will use nightly first 2 weeks.   DES  exposure in utero - Plan: Cytology - PAP( [place]). See HPI for pap history. Annual paps recommended.   Postmenopausal - Plan: DG Bone Density. ERT. S/P TVH BSO.   Screening for osteoporosis - Plan: DG Bone  Density. Normal DXA 2019. Very active, has trainer.   Urgency of urination - Plan: Urinalysis,Complete w/RFL Culture. Negative UA.   Insomnia, unspecified type - Plan: zolpidem  (AMBIEN ) 5 MG tablet as needed for sleep.   Vaginal candidiasis - Plan: WET PREP FOR TRICH, YEAST, CLUE, fluconazole  (DIFLUCAN ) 150 MG tablet every 3 days x 3 doses.   Screening for breast cancer - Overdue and encouraged to schedule. Normal breast exam today.  Screening for colon cancer - 2014 colonoscopy. Encouraged to schedule.   Return in about 1 year (around 10/26/2024) for B&P (high risk).     Pamela DELENA Shutter DNP, 4:44 PM 10/27/2023

## 2023-10-29 ENCOUNTER — Ambulatory Visit: Payer: Self-pay | Admitting: Nurse Practitioner

## 2023-10-29 DIAGNOSIS — R87622 Low grade squamous intraepithelial lesion on cytologic smear of vagina (LGSIL): Secondary | ICD-10-CM

## 2023-10-29 DIAGNOSIS — R87612 Low grade squamous intraepithelial lesion on cytologic smear of cervix (LGSIL): Secondary | ICD-10-CM

## 2023-10-29 LAB — URINALYSIS, COMPLETE W/RFL CULTURE
Bilirubin Urine: NEGATIVE
Glucose, UA: NEGATIVE
Hyaline Cast: NONE SEEN /LPF
Leukocyte Esterase: NEGATIVE
Nitrites, Initial: NEGATIVE
Protein, ur: NEGATIVE
Specific Gravity, Urine: 1.02 (ref 1.001–1.035)
pH: 6 (ref 5.0–8.0)

## 2023-10-29 LAB — URINE CULTURE
MICRO NUMBER:: 16851956
Result:: NO GROWTH
SPECIMEN QUALITY:: ADEQUATE

## 2023-10-29 LAB — CULTURE INDICATED

## 2023-11-03 LAB — CYTOLOGY - PAP
Comment: NEGATIVE
High risk HPV: NEGATIVE

## 2023-11-20 DIAGNOSIS — M546 Pain in thoracic spine: Secondary | ICD-10-CM | POA: Diagnosis not present

## 2023-11-20 DIAGNOSIS — M791 Myalgia, unspecified site: Secondary | ICD-10-CM | POA: Diagnosis not present

## 2023-11-30 ENCOUNTER — Encounter: Payer: Self-pay | Admitting: Nurse Practitioner

## 2023-11-30 ENCOUNTER — Ambulatory Visit (INDEPENDENT_AMBULATORY_CARE_PROVIDER_SITE_OTHER): Admitting: Nurse Practitioner

## 2023-11-30 VITALS — BP 122/80 | HR 75 | Ht 69.0 in | Wt 144.0 lb

## 2023-11-30 DIAGNOSIS — Z23 Encounter for immunization: Secondary | ICD-10-CM | POA: Diagnosis not present

## 2023-11-30 DIAGNOSIS — B3731 Acute candidiasis of vulva and vagina: Secondary | ICD-10-CM

## 2023-11-30 DIAGNOSIS — R87622 Low grade squamous intraepithelial lesion on cytologic smear of vagina (LGSIL): Secondary | ICD-10-CM | POA: Diagnosis not present

## 2023-11-30 MED ORDER — TERCONAZOLE 0.4 % VA CREA: 1.0000 | TOPICAL_CREAM | Freq: Every day | VAGINAL | 0 refills | Status: AC

## 2023-11-30 NOTE — Progress Notes (Signed)
    Patient ID: Pamela Whitehead, female    DOB: 1950/11/07, 73 y.o.   MRN: [id number]  Colposcopy Procedure Note Pamela Whitehead 11/30/2023  Indications: 10/09/2023 LGSIL neg HPV. 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 Still experiencing yeast symptoms. Did Diflucan  x 3 and Mycolog BID. Some improvement but still irritated.   Procedure Details  Colposcopy - vagina The risks and benefits of the procedure and written informed consent obtained. Timeout performed.  Speculum placed in vagina. Vagina swabbed x 3 with acetic acid solution.  White light and green light filter used. Hurricane spray applied for comfort.   Impression: CIN-1  Satisfactory (ECC zone seen): N/A  Findings: Acetowhite changes and vascularity seen at 10 o'clock at apex of vagina.  Cervix colposcopy biopsy taken: Unable to obtain biopsy due to patient discomfort. Dr. Dallie also assisted but patient requested to stop procedure.   Complications: None  Plan: Will treat current yeast infection as this can be causing some of the discomfort with exam. Recommend returning for colpo once infection is gone but patient declines. Is agreeable to pap in 6 months.    Pamela DELENA Shutter DNP, 9:19 AM 11/30/2023

## 2023-12-04 DIAGNOSIS — Z23 Encounter for immunization: Secondary | ICD-10-CM | POA: Diagnosis not present

## 2023-12-15 DIAGNOSIS — Z6820 Body mass index (BMI) 20.0-20.9, adult: Secondary | ICD-10-CM | POA: Diagnosis not present

## 2023-12-15 DIAGNOSIS — I10 Essential (primary) hypertension: Secondary | ICD-10-CM | POA: Diagnosis not present

## 2023-12-15 DIAGNOSIS — R002 Palpitations: Secondary | ICD-10-CM | POA: Diagnosis not present

## 2024-01-08 DIAGNOSIS — M791 Myalgia, unspecified site: Secondary | ICD-10-CM | POA: Diagnosis not present

## 2024-01-08 DIAGNOSIS — M545 Low back pain, unspecified: Secondary | ICD-10-CM | POA: Diagnosis not present

## 2024-02-01 ENCOUNTER — Ambulatory Visit (HOSPITAL_BASED_OUTPATIENT_CLINIC_OR_DEPARTMENT_OTHER)
Admission: RE | Admit: 2024-02-01 | Discharge: 2024-02-01 | Disposition: A | Discharge status: Home / Self Care | Source: Ambulatory Visit | Attending: Nurse Practitioner | Admitting: Nurse Practitioner

## 2024-02-01 DIAGNOSIS — Z13820 Encounter for screening for osteoporosis: Secondary | ICD-10-CM | POA: Diagnosis not present

## 2024-02-01 DIAGNOSIS — Z780 Asymptomatic menopausal state: Secondary | ICD-10-CM | POA: Diagnosis not present

## 2024-02-29 ENCOUNTER — Other Ambulatory Visit: Payer: Self-pay | Admitting: Nurse Practitioner

## 2024-02-29 DIAGNOSIS — G4700 Insomnia, unspecified: Secondary | ICD-10-CM

## 2024-02-29 NOTE — Telephone Encounter (Signed)
 Med refill request: zolpidem   Last AEX: 10/27/23  Next AEX: next OV 05/30/24 Last MMG (if hormonal med) For: Insomnia, unspecified type  Refill authorized: Last rx 10/27/23 #30 with 1 refill. Please Advise?

## 2024-05-30 ENCOUNTER — Ambulatory Visit: Admitting: Nurse Practitioner
